# Patient Record
Sex: Female | Born: 1955 | Race: White | Hispanic: No | Marital: Married | Smoking: Never smoker
Health system: Southern US, Community
[De-identification: ages and names within clinical notes are randomized; demographics above are authoritative.]

## PROBLEM LIST (undated history)

## (undated) DIAGNOSIS — G2581 Restless legs syndrome: Secondary | ICD-10-CM

## (undated) DIAGNOSIS — R51 Headache: Secondary | ICD-10-CM

## (undated) DIAGNOSIS — F329 Major depressive disorder, single episode, unspecified: Secondary | ICD-10-CM

## (undated) DIAGNOSIS — F32A Depression, unspecified: Secondary | ICD-10-CM

## (undated) DIAGNOSIS — I639 Cerebral infarction, unspecified: Secondary | ICD-10-CM

## (undated) DIAGNOSIS — K219 Gastro-esophageal reflux disease without esophagitis: Secondary | ICD-10-CM

## (undated) DIAGNOSIS — G8929 Other chronic pain: Secondary | ICD-10-CM

## (undated) DIAGNOSIS — M1990 Unspecified osteoarthritis, unspecified site: Secondary | ICD-10-CM

## (undated) DIAGNOSIS — E785 Hyperlipidemia, unspecified: Secondary | ICD-10-CM

## (undated) DIAGNOSIS — I10 Essential (primary) hypertension: Secondary | ICD-10-CM

## (undated) DIAGNOSIS — M549 Dorsalgia, unspecified: Secondary | ICD-10-CM

## (undated) DIAGNOSIS — G5603 Carpal tunnel syndrome, bilateral upper limbs: Secondary | ICD-10-CM

## (undated) HISTORY — PX: CARPAL TUNNEL RELEASE: SHX101

## (undated) HISTORY — PX: ABDOMINAL HYSTERECTOMY: SHX81

## (undated) HISTORY — PX: LUMBAR EPIDURAL INJECTION: SHX1980

## (undated) HISTORY — PX: OTHER SURGICAL HISTORY: SHX169

## (undated) HISTORY — DX: Cerebral infarction, unspecified: I63.9

## (undated) HISTORY — PX: KNEE SURGERY: SHX244

---

## 1999-02-05 ENCOUNTER — Encounter: Payer: Self-pay | Admitting: Specialist

## 1999-02-05 ENCOUNTER — Ambulatory Visit (HOSPITAL_COMMUNITY): Admission: RE | Admit: 1999-02-05 | Discharge: 1999-02-05 | Payer: Self-pay | Admitting: Specialist

## 1999-02-19 ENCOUNTER — Encounter: Payer: Self-pay | Admitting: Specialist

## 1999-02-19 ENCOUNTER — Ambulatory Visit (HOSPITAL_COMMUNITY): Admission: RE | Admit: 1999-02-19 | Discharge: 1999-02-19 | Payer: Self-pay | Admitting: Specialist

## 1999-03-12 ENCOUNTER — Encounter: Payer: Self-pay | Admitting: Specialist

## 1999-03-12 ENCOUNTER — Ambulatory Visit (HOSPITAL_COMMUNITY): Admission: RE | Admit: 1999-03-12 | Discharge: 1999-03-12 | Payer: Self-pay | Admitting: Specialist

## 2000-04-05 ENCOUNTER — Ambulatory Visit (HOSPITAL_COMMUNITY): Admission: RE | Admit: 2000-04-05 | Discharge: 2000-04-05 | Payer: Self-pay | Admitting: Specialist

## 2001-07-03 ENCOUNTER — Encounter: Payer: Self-pay | Admitting: Family Medicine

## 2001-07-03 ENCOUNTER — Encounter: Admission: RE | Admit: 2001-07-03 | Discharge: 2001-07-03 | Payer: Self-pay | Admitting: Family Medicine

## 2005-01-19 ENCOUNTER — Observation Stay (HOSPITAL_COMMUNITY): Admission: EM | Admit: 2005-01-19 | Discharge: 2005-01-21 | Payer: Self-pay | Admitting: Emergency Medicine

## 2006-03-11 ENCOUNTER — Emergency Department (HOSPITAL_COMMUNITY): Admission: EM | Admit: 2006-03-11 | Discharge: 2006-03-11 | Payer: Self-pay | Admitting: Emergency Medicine

## 2006-11-09 ENCOUNTER — Ambulatory Visit (HOSPITAL_COMMUNITY): Admission: RE | Admit: 2006-11-09 | Discharge: 2006-11-09 | Payer: Self-pay | Admitting: Internal Medicine

## 2006-12-06 ENCOUNTER — Ambulatory Visit (HOSPITAL_COMMUNITY): Admission: RE | Admit: 2006-12-06 | Discharge: 2006-12-06 | Payer: Self-pay | Admitting: Internal Medicine

## 2007-06-19 ENCOUNTER — Other Ambulatory Visit: Admission: RE | Admit: 2007-06-19 | Discharge: 2007-06-19 | Payer: Self-pay | Admitting: Plastic Surgery

## 2007-12-10 ENCOUNTER — Ambulatory Visit (HOSPITAL_COMMUNITY): Admission: RE | Admit: 2007-12-10 | Discharge: 2007-12-10 | Payer: Self-pay | Admitting: Internal Medicine

## 2009-10-21 ENCOUNTER — Emergency Department (HOSPITAL_COMMUNITY): Admission: EM | Admit: 2009-10-21 | Discharge: 2009-10-21 | Payer: Self-pay | Admitting: Emergency Medicine

## 2009-11-11 ENCOUNTER — Emergency Department (HOSPITAL_COMMUNITY): Admission: EM | Admit: 2009-11-11 | Discharge: 2009-11-11 | Payer: Self-pay | Admitting: Emergency Medicine

## 2010-04-07 LAB — COMPREHENSIVE METABOLIC PANEL
ALT: 16 U/L (ref 0–35)
AST: 15 U/L (ref 0–37)
Albumin: 4.1 g/dL (ref 3.5–5.2)
BUN: 7 mg/dL (ref 6–23)
CO2: 24 mEq/L (ref 19–32)
Calcium: 9.1 mg/dL (ref 8.4–10.5)
Chloride: 98 mEq/L (ref 96–112)
GFR calc Af Amer: >60 mL/min (ref >60)
Glucose, Bld: 201 mg/dL — ABNORMAL HIGH (ref 70–99)
Sodium: 135 mEq/L (ref 135–145)
Total Bilirubin: 0.4 mg/dL (ref 0.3–1.2)

## 2010-04-07 LAB — URINALYSIS, ROUTINE W REFLEX MICROSCOPIC
Glucose, UA: 100 mg/dL — AB
Leukocytes, UA: NEGATIVE
Specific Gravity, Urine: 1.025 (ref 1.005–1.030)
pH: 6 (ref 5.0–8.0)

## 2010-06-11 NOTE — Op Note (Signed)
[facility]  Patient:    LAVREN, CHIAPPONE                       MRN: [id number] Proc. Date: 04/05/00 Adm. Date:  [id number] Attending:  Tye Savoy                           Operative Report  PREOPERATIVE DIAGNOSIS:  Chondromalacia, patellofemoral joint.  POSTOPERATIVE DIAGNOSES:  Chondromalacia, patellofemoral joint, grade 3 chondromalacia of the medial femoral condyle and medial tibial plateau, and a loose body, grade 3 changes of patellofemoral sulcus, extensive, chondromalacia of the patella.  BRIEF HISTORY AND INDICATIONS:  A 55 year old with persistent knee pain, MRI indicating tricompartmental osteoarthrosis.  Operative intervention is indicated for diagnosis and treatment of persistent pain in the knee and giving way, refractory to conservative treatment, including corticosteroid injections, anti-inflammatories, activity modification.  Risks and benefits discussed, including bleeding, infection, damage to vascular structures, DVT, PE, no change of symptoms, need for repeat debridement, corticosteroids, anti-inflammatories, etc.  DESCRIPTION OF PROCEDURE:  Patient in supine position.  After induction of an adequate general anesthesia, 1 g Kefzol, the right lower extremity was prepped and draped in the usual sterile fashion.  A lateral parapatellar portal and a superior medial parapatellar portal were fashioned with a #11 blade, egress cannula atraumatically placed.  Irrigant was utilized to insufflate the joint. Inspection of the suprapatellar pouch revealed extensive chondral pathology of the patellofemoral region particularly of the sulcus and the patella and medial and lateral facets.  Normal patellofemoral tracking.  Subtrochanteric synovium in the intercondylar notch.  _____ compartment exam revealed grade 3 changes of the femoral condyle and tibial plateau, and a loose chondral body was noted.  Under direct visualization, an  18-gauge needle was utilized to localize the medial parapatellar portal, which was fashioned with a #11 blade to _____ the medial meniscus.  A shaver was introduced and utilized to shave and evacuate the loose body.  Chondroplasty of the femoral condyle, tibial plateau, patella, and femoral sulcus was performed.  A basket rongeur was also utilized to remove chondral flap tears from the femoral sulcus.  ACL and PCL were unremarkable.  Lateral compartment had some minor grade 2 changes to the tibial plateau and femoral condyle.  Both menisci were stable to probe palpation without evidence of a tear.  Gutters were unremarkable as well.  The knee was copiously lavaged.  All compartments were re-examined.  There was no loose cartilaginous debris.  Chondroplasty of both patella and sulcus, femoral condyles was satisfactory.  The knee was then copiously irrigated, and all instrumentation was removed.  The portals were closed with 4-0 nylon simple sutures.  Marcaine 0.25% with epinephrine was infiltrated in the joint.  The wound was then dressed sterilely, secured with a six-inch Ace and a four-inch Ace down to the foot.  No tourniquet was utilized.  Operative time was 35 minutes.  The patient was awakened without difficulty and transported to the recovery room in stable condition.  The patient tolerated the procedure well with no complications. DD:  04/05/00 TD:  04/05/00 Job: [id number]

## 2010-06-11 NOTE — Discharge Summary (Signed)
Tracy Farrell, Tracy Farrell                ACCOUNT NO.:  [id number]   MEDICAL RECORD NO.:  [id number]          PATIENT TYPE:  OBV   LOCATION:  A208                          FACILITY:  [facility]   PHYSICIAN:  Audria Nine, M.D.DATE OF BIRTH:  1955/09/20   DATE OF ADMISSION:  01/19/2005  DATE OF DISCHARGE:  12/29/2006LH                                 DISCHARGE SUMMARY   PRIMARY CARE PHYSICIAN:  Carolann Littler, M.D. in [place].   ADMISSION DIAGNOSES:  Cellulitis of the right hand and paronychia.   DISCHARGE MEDICATIONS:  1.  Augmentin 875 mg p.o. b.i.d.  2.  Glyburide one p.o. b.i.d.  3.  Altace 5 mg p.o. daily.   OTHER DIAGNOSES OF NOTE:  1.  Insulin-dependent diabetes.  2.  Hypertension.  3.  Hyperlipidemia.  4.  Arthritis.   DIET:  1800, ADA, consistent carbohydrate, diabetic diet.   ACTIVITY:  As tolerated.  The patient advised to elevate the right hand.   SPECIAL INSTRUCTIONS:  The patient was advised to return to the emergency  room if hand swelling returns, severe pain in the hand or discoloration or  discharge where the abscess is.  The patient will also do daily dressings at  home. She will keep the hand elevated.   PERTINENT LABORATORY DATA:  On admission, her white blood cell count was  11.5, hemoglobin 13.9, hematocrit 41.2, platelet count 381.  Sodium 135,  potassium 3.7, chloride 100, BUN 5, creatinine 0.9, glucose 124.  Cultures  show no WBC with some rare gram-positive cocci in pairs and in chains.  Final identification was pending at the time of discharge.   HOSPITAL COURSE:  Kindly review Dr. Reggy Eye history and physical on  January 20, 2005 seen in the E-chart.   She is a 55 year old Caucasian female with diabetes mellitus who developed a  paronychia in the right third finger of several days duration.  She also had  a blister on the dorsum of her hand.  Examination at the time revealed that  she has cellulitis and a draining abscess.  She was also  subsequently  started on intravenous Unasyn and continued on insulin with a sliding scale.  Oral hypoglycemic agents were also resumed.  The patient showed a  significant improvement with markedly less swelling and the abscess stopped  draining pus.  The patient did not have any evidence of neurovascular  compromise and capillary filling of all her fingers on the right hand was  intact.  She also  had no discoloration.  The patient is being discharged today on oral  antibiotics, to follow up with her primary care physician in the next one to  two weeks.   DISPOSITION:  To home.      Audria Nine, M.D.  Electronically Signed     AM/MEDQ  D:  01/21/2005  T:  01/21/2005  Job:  [id number]

## 2010-06-11 NOTE — H&P (Signed)
NAMEBOSTYNN, HITCHINGS                ACCOUNT NO.:  [id number]   MEDICAL RECORD NO.:  [id number]          PATIENT TYPE:  OBV   LOCATION:  A208                          FACILITY:  [facility]   PHYSICIAN:  Debbe Odea, M.D.     DATE OF BIRTH:  September 26, 1955   DATE OF ADMISSION:  01/19/2005  DATE OF DISCHARGE:  LH                                HISTORY & PHYSICAL   PRIMARY CARE PHYSICIAN:  Dr. Elease Hashimoto in [place].   PRESENTING COMPLAINT:  Swelling and redness in her right hand.   HISTORY OF PRESENT ILLNESS:  This is a 55 year old Caucasian female with a  past medical history of insulin-dependent diabetes mellitus, who has  developed a paronychia in her right third few finger a few days ago, which  was followed by a blood blister on her dorsum of her hand, which was  recently punctured, draining blood and pus.  Her right hand was swollen and  warm.  She was unable to flex her fingers.  In the ER her paronychia has  been drained and the fluid has been sent for culture.  The patient believes  that she might have had a spider bite on the dorsum of her hand; however,  the swelling and blister formation on the hand started after the infection  in her right third finger and therefore it is most likely just the spread of  infection rather than an insect bite.  All other review of systems is  negative.   PAST MEDICAL HISTORY:  1.  Noninsulin-dependent diabetes mellitus.  2.  Hypertension.  3.  Hyperlipidemia.  4.  Arthritis.   PAST SURGICAL HISTORY:  1.  Carpal tunnel surgery in bilateral hands.  2.  Right knee arthroscopy.  3.  Hysterectomy.   ALLERGIES:  SULFA DRUGS caused formation of rash in her mouth.   SOCIAL HISTORY:  She does not smoke, does not use drugs, does not drink any  alcohol.  She is married.  She has two children, who are alive and healthy.   FAMILY HISTORY:  Both her mother and father had diabetes and died of heart  disease.  She has one brother, whom she states has  heart disease as well.   MEDICATIONS:  1.  She takes glyburide one tablet twice a day, she does not know the dose.  2.  She takes Altace 5 mg daily.   PHYSICAL EXAMINATION:  VITAL SIGNS:  Temperature was 98.6 degrees, blood  pressure 148/87, pulse 93, respiratory rate 18, pulse oximetry 96% on room  air.  HEENT:  Atraumatic, normocephalic.  Pupils equal, round, and reactive to  light and accommodation, extraocular muscles are intact.  Oral mucosa is  moist.  NECK:  Supple.  CARDIAC:  Regular rate and rhythm.  ABDOMEN:  Soft, nondistended, nondistended, bowel sounds are positive .  LUNGS:  Clear bilaterally.  EXTREMITIES:  Her right hand is swollen.  There is erythema, warmth and  tenderness.  There is a ruptured nodule which seems to have contained mostly  blood.  Her paronychia has been drained, and there is no longer any  swelling  in that area.  She is unable to completely flex her fingers due to swelling  and pain.   BLOOD WORK:  WBC count is 11.5, hemoglobin 13.9, hematocrit 41.2, platelets  381.  Sodium 135, potassium 3.7, chloride 100, bicarb 27, glucose 124, BUN  5, creatinine 0.9, calcium 9.2.   ASSESSMENT AND PLAN:  This is a 55 year old diabetic female with a right  hand paronychia, cellulitis and possible abscess which has ruptured.  She is  going to be hospitalized for IV antibiotics for at least 24 hours.  When  improvement is seen in the right hand and after culture report is obtained,  she will be discharged home on appropriate antibiotics.  Her home  medications will be continued while she is here.  She will be placed on  insulin sliding scale as well.      Debbe Odea, M.D.  Electronically Signed     SR/MEDQ  D:  01/20/2005  T:  01/20/2005  Job:  [id number]   cc:   Carolann Littler, M.D.  Fax: [phone]

## 2010-06-11 NOTE — H&P (Signed)
NAMEBONNA, DUPUIS                ACCOUNT NO.:  [id number]   MEDICAL RECORD NO.:  [id number]          PATIENT TYPE:  OBV   LOCATION:  A208                          FACILITY:  [facility]   PHYSICIAN:  Debbe Odea, M.D.     DATE OF BIRTH:  1955/11/23   DATE OF ADMISSION:  01/19/2005  DATE OF DISCHARGE:  LH                                HISTORY & PHYSICAL   Audio too short to transcribe (less than 5 seconds)      Debbe Odea, M.D.     SR/MEDQ  D:  01/20/2005  T:  01/20/2005  Job:  [id number]

## 2011-08-11 ENCOUNTER — Encounter (HOSPITAL_BASED_OUTPATIENT_CLINIC_OR_DEPARTMENT_OTHER): Payer: Self-pay | Admitting: *Deleted

## 2011-08-11 NOTE — Progress Notes (Signed)
To come in for bmet-ekg Hx cellutitis rt hand-2006 bilat CTS

## 2011-08-15 ENCOUNTER — Encounter (HOSPITAL_BASED_OUTPATIENT_CLINIC_OR_DEPARTMENT_OTHER)
Admission: RE | Admit: 2011-08-15 | Discharge: 2011-08-15 | Disposition: A | Discharge status: Home / Self Care | Payer: Medicaid Other | Source: Ambulatory Visit | Attending: Orthopedic Surgery | Admitting: Orthopedic Surgery

## 2011-08-15 LAB — BASIC METABOLIC PANEL
BUN: 15 mg/dL (ref 6–23)
Calcium: 9.5 mg/dL (ref 8.4–10.5)
Creatinine, Ser: 0.75 mg/dL (ref 0.50–1.10)
GFR calc Af Amer: >90 mL/min (ref >90)
GFR calc non Af Amer: >90 mL/min (ref >90)
Glucose, Bld: 140 mg/dL — ABNORMAL HIGH (ref 70–99)

## 2011-08-17 ENCOUNTER — Encounter (HOSPITAL_BASED_OUTPATIENT_CLINIC_OR_DEPARTMENT_OTHER): Payer: Self-pay

## 2011-08-17 ENCOUNTER — Encounter (HOSPITAL_BASED_OUTPATIENT_CLINIC_OR_DEPARTMENT_OTHER)
Admission: RE | Disposition: A | Discharge status: Home / Self Care | Payer: Self-pay | Source: Ambulatory Visit | Attending: Orthopedic Surgery

## 2011-08-17 ENCOUNTER — Encounter (HOSPITAL_BASED_OUTPATIENT_CLINIC_OR_DEPARTMENT_OTHER): Payer: Self-pay | Admitting: Certified Registered Nurse Anesthetist

## 2011-08-17 ENCOUNTER — Ambulatory Visit (HOSPITAL_BASED_OUTPATIENT_CLINIC_OR_DEPARTMENT_OTHER)
Admission: RE | Admit: 2011-08-17 | Discharge: 2011-08-17 | Disposition: A | Discharge status: Home / Self Care | Payer: Medicaid Other | Source: Ambulatory Visit | Attending: Orthopedic Surgery | Admitting: Orthopedic Surgery

## 2011-08-17 ENCOUNTER — Ambulatory Visit (HOSPITAL_BASED_OUTPATIENT_CLINIC_OR_DEPARTMENT_OTHER): Payer: Medicaid Other | Admitting: Certified Registered Nurse Anesthetist

## 2011-08-17 DIAGNOSIS — M129 Arthropathy, unspecified: Secondary | ICD-10-CM | POA: Insufficient documentation

## 2011-08-17 DIAGNOSIS — I10 Essential (primary) hypertension: Secondary | ICD-10-CM | POA: Insufficient documentation

## 2011-08-17 DIAGNOSIS — G2581 Restless legs syndrome: Secondary | ICD-10-CM | POA: Insufficient documentation

## 2011-08-17 DIAGNOSIS — F3289 Other specified depressive episodes: Secondary | ICD-10-CM | POA: Insufficient documentation

## 2011-08-17 DIAGNOSIS — M549 Dorsalgia, unspecified: Secondary | ICD-10-CM | POA: Insufficient documentation

## 2011-08-17 DIAGNOSIS — R51 Headache: Secondary | ICD-10-CM | POA: Insufficient documentation

## 2011-08-17 DIAGNOSIS — E785 Hyperlipidemia, unspecified: Secondary | ICD-10-CM | POA: Insufficient documentation

## 2011-08-17 DIAGNOSIS — E119 Type 2 diabetes mellitus without complications: Secondary | ICD-10-CM | POA: Insufficient documentation

## 2011-08-17 DIAGNOSIS — G5600 Carpal tunnel syndrome, unspecified upper limb: Secondary | ICD-10-CM | POA: Insufficient documentation

## 2011-08-17 DIAGNOSIS — K219 Gastro-esophageal reflux disease without esophagitis: Secondary | ICD-10-CM | POA: Insufficient documentation

## 2011-08-17 DIAGNOSIS — F329 Major depressive disorder, single episode, unspecified: Secondary | ICD-10-CM | POA: Insufficient documentation

## 2011-08-17 HISTORY — DX: Unspecified osteoarthritis, unspecified site: M19.90

## 2011-08-17 HISTORY — DX: Depression, unspecified: F32.A

## 2011-08-17 HISTORY — DX: Gastro-esophageal reflux disease without esophagitis: K21.9

## 2011-08-17 HISTORY — DX: Hyperlipidemia, unspecified: E78.5

## 2011-08-17 HISTORY — DX: Dorsalgia, unspecified: M54.9

## 2011-08-17 HISTORY — DX: Restless legs syndrome: G25.81

## 2011-08-17 HISTORY — DX: Headache: R51

## 2011-08-17 HISTORY — DX: Essential (primary) hypertension: I10

## 2011-08-17 HISTORY — DX: Carpal tunnel syndrome, bilateral upper limbs: G56.03

## 2011-08-17 HISTORY — PX: CARPAL TUNNEL RELEASE: SHX101

## 2011-08-17 HISTORY — DX: Other chronic pain: G89.29

## 2011-08-17 HISTORY — DX: Major depressive disorder, single episode, unspecified: F32.9

## 2011-08-17 LAB — GLUCOSE, CAPILLARY: Glucose-Capillary: 113 mg/dL — ABNORMAL HIGH (ref 70–99)

## 2011-08-17 LAB — POCT HEMOGLOBIN-HEMACUE: Hemoglobin: 12.9 g/dL (ref 12.0–15.0)

## 2011-08-17 SURGERY — CARPAL TUNNEL RELEASE
Anesthesia: Regional | Site: Wrist | Laterality: Right | Wound class: Clean

## 2011-08-17 MED ORDER — FENTANYL CITRATE 0.05 MG/ML IJ SOLN
INTRAMUSCULAR | Status: DC | PRN
  Administered 2011-08-17: 50 ug via INTRAVENOUS
  Administered 2011-08-17: 25 ug via INTRAVENOUS

## 2011-08-17 MED ORDER — MEPERIDINE HCL 25 MG/ML IJ SOLN: 6.2500 mg | INTRAMUSCULAR | Status: DC | PRN

## 2011-08-17 MED ORDER — LIDOCAINE HCL (PF) 0.5 % IJ SOLN
INTRAMUSCULAR | Status: DC | PRN
  Administered 2011-08-17: 50 mL via INTRATHECAL

## 2011-08-17 MED ORDER — CHLORHEXIDINE GLUCONATE 4 % EX LIQD: 60.0000 mL | Freq: Once | CUTANEOUS | Status: DC

## 2011-08-17 MED ORDER — CEFAZOLIN SODIUM-DEXTROSE 2-3 GM-% IV SOLR
2.0000 g | INTRAVENOUS | Status: AC
  Administered 2011-08-17: 2 g via INTRAVENOUS

## 2011-08-17 MED ORDER — LACTATED RINGERS IV SOLN
INTRAVENOUS | Status: DC
  Administered 2011-08-17: 10:00:00 via INTRAVENOUS

## 2011-08-17 MED ORDER — MIDAZOLAM HCL 2 MG/2ML IJ SOLN: 0.5000 mg | Freq: Once | INTRAMUSCULAR | Status: DC | PRN

## 2011-08-17 MED ORDER — LIDOCAINE HCL (CARDIAC) 20 MG/ML IV SOLN
INTRAVENOUS | Status: DC | PRN
  Administered 2011-08-17: 30 mg via INTRAVENOUS

## 2011-08-17 MED ORDER — HYDROCODONE-ACETAMINOPHEN 5-500 MG PO TABS: 1.0000 | ORAL_TABLET | ORAL | Status: AC | PRN

## 2011-08-17 MED ORDER — FENTANYL CITRATE 0.05 MG/ML IJ SOLN
25.0000 ug | INTRAMUSCULAR | Status: DC | PRN
  Administered 2011-08-17: 25 ug via INTRAVENOUS

## 2011-08-17 MED ORDER — MIDAZOLAM HCL 5 MG/5ML IJ SOLN
INTRAMUSCULAR | Status: DC | PRN
  Administered 2011-08-17: 1 mg via INTRAVENOUS

## 2011-08-17 MED ORDER — PROMETHAZINE HCL 25 MG/ML IJ SOLN: 6.2500 mg | INTRAMUSCULAR | Status: DC | PRN

## 2011-08-17 MED ORDER — BUPIVACAINE HCL (PF) 0.25 % IJ SOLN
INTRAMUSCULAR | Status: DC | PRN
  Administered 2011-08-17: 4 mL

## 2011-08-17 MED ORDER — PROPOFOL 10 MG/ML IV EMUL
INTRAVENOUS | Status: DC | PRN
  Administered 2011-08-17: 25 ug/kg/min via INTRAVENOUS

## 2011-08-17 MED ORDER — ONDANSETRON HCL 4 MG/2ML IJ SOLN
INTRAMUSCULAR | Status: DC | PRN
  Administered 2011-08-17: 4 mg via INTRAVENOUS

## 2011-08-17 SURGICAL SUPPLY — 39 items
BANDAGE GAUZE ELAST BULKY 4 IN (GAUZE/BANDAGES/DRESSINGS) ×2 IMPLANT
BLADE SURG 15 STRL LF DISP TIS (BLADE) ×1 IMPLANT
BLADE SURG 15 STRL SS (BLADE) ×2
BNDG CMPR 9X4 STRL LF SNTH (GAUZE/BANDAGES/DRESSINGS)
BNDG COHESIVE 3X5 TAN STRL LF (GAUZE/BANDAGES/DRESSINGS) ×2 IMPLANT
BNDG ESMARK 4X9 LF (GAUZE/BANDAGES/DRESSINGS) IMPLANT
CHLORAPREP W/TINT 26ML (MISCELLANEOUS) ×2 IMPLANT
CLOTH BEACON ORANGE TIMEOUT ST (SAFETY) ×2 IMPLANT
CORDS BIPOLAR (ELECTRODE) ×2 IMPLANT
COVER MAYO STAND STRL (DRAPES) ×2 IMPLANT
COVER TABLE BACK 60X90 (DRAPES) ×2 IMPLANT
CUFF TOURNIQUET SINGLE 18IN (TOURNIQUET CUFF) ×1 IMPLANT
DRAPE EXTREMITY T 121X128X90 (DRAPE) ×2 IMPLANT
DRAPE SURG 17X23 STRL (DRAPES) ×2 IMPLANT
DRSG KUZMA FLUFF (GAUZE/BANDAGES/DRESSINGS) ×2 IMPLANT
GAUZE XEROFORM 1X8 LF (GAUZE/BANDAGES/DRESSINGS) ×2 IMPLANT
GLOVE BIO SURGEON STRL SZ 6.5 (GLOVE) ×2 IMPLANT
GLOVE EXAM NITRILE PF MED BLUE (GLOVE) ×1 IMPLANT
GLOVE SURG ORTHO 8.0 STRL STRW (GLOVE) ×2 IMPLANT
GOWN BRE IMP PREV XXLGXLNG (GOWN DISPOSABLE) ×2 IMPLANT
GOWN PREVENTION PLUS XLARGE (GOWN DISPOSABLE) ×2 IMPLANT
NEEDLE 27GAX1X1/2 (NEEDLE) ×1 IMPLANT
NS IRRIG 1000ML POUR BTL (IV SOLUTION) ×2 IMPLANT
PACK BASIN DAY SURGERY FS (CUSTOM PROCEDURE TRAY) ×2 IMPLANT
PAD CAST 3X4 CTTN HI CHSV (CAST SUPPLIES) ×1 IMPLANT
PADDING CAST ABS 4INX4YD NS (CAST SUPPLIES)
PADDING CAST ABS COTTON 4X4 ST (CAST SUPPLIES) ×1 IMPLANT
PADDING CAST COTTON 3X4 STRL (CAST SUPPLIES) ×2
SPLINT PLASTER CAST XFAST 3X15 (CAST SUPPLIES) IMPLANT
SPLINT PLASTER XTRA FASTSET 3X (CAST SUPPLIES) ×10
SPONGE GAUZE 4X4 12PLY (GAUZE/BANDAGES/DRESSINGS) ×2 IMPLANT
STOCKINETTE 4X48 STRL (DRAPES) ×2 IMPLANT
SUT VIC AB 4-0 P2 18 (SUTURE) ×1 IMPLANT
SUT VICRYL 4-0 PS2 18IN ABS (SUTURE) IMPLANT
SUT VICRYL RAPIDE 4/0 PS 2 (SUTURE) ×2 IMPLANT
SYR BULB 3OZ (MISCELLANEOUS) ×2 IMPLANT
SYR CONTROL 10ML LL (SYRINGE) ×1 IMPLANT
TOWEL OR 17X24 6PK STRL BLUE (TOWEL DISPOSABLE) ×2 IMPLANT
UNDERPAD 30X30 INCONTINENT (UNDERPADS AND DIAPERS) ×2 IMPLANT

## 2011-08-17 NOTE — Anesthesia Preprocedure Evaluation (Signed)
Anesthesia Evaluation  Patient identified by MRN, date of birth, ID band Patient awake    Reviewed: Allergy & Precautions, H&P , NPO status , Patient's Chart, lab work & pertinent test results, reviewed documented beta blocker date and time   History of Anesthesia Complications Negative for: history of anesthetic complications  Airway Mallampati: I TM Distance: >3 FB Neck ROM: Full    Dental No notable dental hx. (+) Dental Advisory Given   Pulmonary neg pulmonary ROS,  breath sounds clear to auscultation  Pulmonary exam normal       Cardiovascular hypertension, Pt. on medications Rhythm:Regular Rate:Normal     Neuro/Psych negative neurological ROS     GI/Hepatic Neg liver ROS, GERD-  Medicated and Controlled,  Endo/Other  Well Controlled, Type 2, Oral Hypoglycemic AgentsMorbid obesity  Renal/GU      Musculoskeletal   Abdominal (+) + obese,   Peds  Hematology negative hematology ROS (+)   Anesthesia Other Findings   Reproductive/Obstetrics                           Anesthesia Physical Anesthesia Plan  ASA: II  Anesthesia Plan: Bier Block   Post-op Pain Management:    Induction:   Airway Management Planned: Simple Face Mask  Additional Equipment:   Intra-op Plan:   Post-operative Plan:   Informed Consent: I have reviewed the patients History and Physical, chart, labs and discussed the procedure including the risks, benefits and alternatives for the proposed anesthesia with the patient or authorized representative who has indicated his/her understanding and acceptance.   Dental advisory given  Plan Discussed with: Surgeon and CRNA  Anesthesia Plan Comments: (Plan routine monitors, IV Regional Lidocaine)        Anesthesia Quick Evaluation

## 2011-08-17 NOTE — Transfer of Care (Signed)
Immediate Anesthesia Transfer of Care Note  Patient: Tracy Farrell  Procedure(s) Performed: Procedure(s) (LRB): CARPAL TUNNEL RELEASE (Right)  Patient Location: PACU  Anesthesia Type: MAC and Bier block  Level of Consciousness: awake, alert , oriented and patient cooperative  Airway & Oxygen Therapy: Patient Spontanous Breathing and Patient connected to face mask oxygen  Post-op Assessment: Report given to PACU RN and Post -op Vital signs reviewed and stable  Post vital signs: Reviewed and stable  Complications: No apparent anesthesia complications

## 2011-08-17 NOTE — Op Note (Signed)
Dictated [phone]

## 2011-08-17 NOTE — Anesthesia Procedure Notes (Signed)
Date/Time: 08/17/2011 11:00 AM Performed by: Makenzey Nanni D Pre-anesthesia Checklist: Patient identified, Emergency Drugs available, Suction available, Patient being monitored and Timeout performed Patient Re-evaluated:Patient Re-evaluated prior to inductionOxygen Delivery Method: Simple face mask

## 2011-08-17 NOTE — Op Note (Signed)
NAMEGWENDOLY, Tracy Farrell                ACCOUNT NO.:  [id number]  MEDICAL RECORD NO.:  [id number]  LOCATION:                                 FACILITY:  PHYSICIAN:  Daryll Brod, M.D.            DATE OF BIRTH:  DATE OF PROCEDURE:  08/17/2011 DATE OF DISCHARGE:                              OPERATIVE REPORT   PREOPERATIVE DIAGNOSIS:  Recurrent carpal tunnel syndrome, right hand.  POSTOPERATIVE DIAGNOSIS:  Recurrent carpal tunnel syndrome, right hand.  OPERATION:  Re-release of carpal canal with hypothenar fat pad transfer, right hand.  SURGEON:  Daryll Brod, MD  ANESTHESIA:  Upper arm IV regional.  ANESTHESIOLOGIST:  Dr. Glennon Mac.  HISTORY:  The patient is a 56 year old female with a history of recurrent carpal tunnel syndrome.  She underwent carpal tunnel release approximately 20 years ago, has had recurrence of symptoms with markedly positive nerve conductions.  She does have arthritic changes of cervical spine as well aware of the potential for a double crush.  Pre, peri, and postoperative course have been discussed along with risks and complications.  She is aware that there is no guarantee with the surgery, possibility of infection, recurrence, injury to arteries, nerves, tendons, incomplete relief of symptoms, dystrophy.  In the preoperative area, the patient is seen, the extremity marked by both the patient and surgeon.  Antibiotic given.  DESCRIPTION OF PROCEDURE:  The patient was brought to the operating room where an upper arm IV regional anesthetic was carried out without difficulty.  She was prepped using ChloraPrep, supine position, right arm free.  A 3-minute dry time was allowed.  Time-out taken, confirming the patient and procedure.  The incision was made longitudinally in the palm, carried to the ulnar side of her wrist then on to her distal forearm to the level of her proximal aspect of her incision, carried down through subcutaneous tissue.  Dissection was carried to  the radial side of the carpal retinaculum allowing the fat to be elevated off from the retinaculum.  The distal forearm fascia and flexor carpal retinaculum was identified at the distal margin, this was incised longitudinally with sharp dissection.  The median nerve was immediately apparent with an hourglass deformity at the level of the hamate hook. The forearm fascia was released.  The hypothenar fat pad was then harvested and allowing this to be rotated subcutaneously.  The tissue was dissected free from the overlying dermis.  This was maintained on an ulnar pedicle rotated into the carpal canal and sutured to the under surface of the flexor retinaculum after copiously irrigating the canal with saline.  The suturing was done with interrupted 4-0 Vicryl sutures. This allowed a new bed for the nerve to align in the subcutaneous tissue.  Remainder was then closed with interrupted 4-0 Vicryl and the skin with interrupted 4-0 Vicryl Rapide sutures.  A sterile compressive dressing was applied.  The tourniquet was deflated.  The fingers immediately pinked.  A splint was applied to the forearm.  The patient was taken to the recovery room for observation.          ______________________________ Daryll Brod, M.D.  GK/MEDQ  D:  08/17/2011  T:  08/17/2011  Job:  [id number]

## 2011-08-17 NOTE — Brief Op Note (Signed)
08/17/2011  11:47 AM  PATIENT:  Princess Perna  56 y.o. female  PRE-OPERATIVE DIAGNOSIS:  recurrent right carpal tunnel syndrome  POST-OPERATIVE DIAGNOSIS:  recurrent right carpal tunnel syndrome  PROCEDURE:  Procedure(s) (LRB): CARPAL TUNNEL RELEASE (Right)  SURGEON:  Surgeon(s) and Role:    * Wynonia Sours, MD - Primary  PHYSICIAN ASSISTANT:   ASSISTANTS: none   ANESTHESIA:   local and regional  EBL:  Total I/O In: 800 [I.V.:800] Out: -   BLOOD ADMINISTERED:none  DRAINS: none   LOCAL MEDICATIONS USED:  MARCAINE     SPECIMEN:  No Specimen  DISPOSITION OF SPECIMEN:  N/A  COUNTS:  YES  TOURNIQUET:   Total Tourniquet Time Documented: Upper Arm (Right) - 41 minutes  DICTATION: .Other Dictation: Dictation Number [id number]  PLAN OF CARE: Discharge to home after PACU  PATIENT DISPOSITION:  PACU - hemodynamically stable.

## 2011-08-17 NOTE — Anesthesia Postprocedure Evaluation (Signed)
  Anesthesia Post-op Note  Patient: Tracy Farrell  Procedure(s) Performed: Procedure(s) (LRB): CARPAL TUNNEL RELEASE (Right)  Patient Location: PACU  Anesthesia Type: Bier block  Level of Consciousness: awake, alert  and oriented  Airway and Oxygen Therapy: Patient Spontanous Breathing  Post-op Pain: none  Post-op Assessment: Post-op Vital signs reviewed, Patient's Cardiovascular Status Stable, Respiratory Function Stable, Patent Airway, No signs of Nausea or vomiting and Pain level controlled  Post-op Vital Signs: Reviewed and stable  Complications: No apparent anesthesia complications
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
## 2011-08-18 ENCOUNTER — Encounter (HOSPITAL_BASED_OUTPATIENT_CLINIC_OR_DEPARTMENT_OTHER): Payer: Self-pay | Admitting: Orthopedic Surgery

## 2011-08-25 ENCOUNTER — Ambulatory Visit: Payer: Medicaid Other | Admitting: Occupational Therapy

## 2017-05-29 LAB — HM DIABETES EYE EXAM

## 2018-10-07 ENCOUNTER — Emergency Department (HOSPITAL_COMMUNITY): Payer: Medicaid Other

## 2018-10-07 ENCOUNTER — Other Ambulatory Visit: Payer: Self-pay

## 2018-10-07 ENCOUNTER — Encounter (HOSPITAL_COMMUNITY): Payer: Self-pay | Admitting: Emergency Medicine

## 2018-10-07 ENCOUNTER — Inpatient Hospital Stay (HOSPITAL_COMMUNITY)
Admission: EM | Admit: 2018-10-07 | Discharge: 2018-10-09 | DRG: 100 | Disposition: A | Discharge status: Home / Self Care | Payer: Medicaid Other | Attending: Internal Medicine | Admitting: Internal Medicine

## 2018-10-07 DIAGNOSIS — Z8673 Personal history of transient ischemic attack (TIA), and cerebral infarction without residual deficits: Secondary | ICD-10-CM | POA: Diagnosis present

## 2018-10-07 DIAGNOSIS — G9341 Metabolic encephalopathy: Secondary | ICD-10-CM

## 2018-10-07 DIAGNOSIS — E785 Hyperlipidemia, unspecified: Secondary | ICD-10-CM | POA: Diagnosis present

## 2018-10-07 DIAGNOSIS — R4701 Aphasia: Secondary | ICD-10-CM | POA: Diagnosis present

## 2018-10-07 DIAGNOSIS — G8929 Other chronic pain: Secondary | ICD-10-CM | POA: Diagnosis present

## 2018-10-07 DIAGNOSIS — E1165 Type 2 diabetes mellitus with hyperglycemia: Secondary | ICD-10-CM | POA: Diagnosis present

## 2018-10-07 DIAGNOSIS — E440 Moderate protein-calorie malnutrition: Secondary | ICD-10-CM

## 2018-10-07 DIAGNOSIS — G2581 Restless legs syndrome: Secondary | ICD-10-CM | POA: Diagnosis present

## 2018-10-07 DIAGNOSIS — M549 Dorsalgia, unspecified: Secondary | ICD-10-CM | POA: Diagnosis present

## 2018-10-07 DIAGNOSIS — Z882 Allergy status to sulfonamides status: Secondary | ICD-10-CM

## 2018-10-07 DIAGNOSIS — E119 Type 2 diabetes mellitus without complications: Secondary | ICD-10-CM

## 2018-10-07 DIAGNOSIS — R4182 Altered mental status, unspecified: Secondary | ICD-10-CM | POA: Diagnosis present

## 2018-10-07 DIAGNOSIS — I10 Essential (primary) hypertension: Secondary | ICD-10-CM

## 2018-10-07 DIAGNOSIS — K219 Gastro-esophageal reflux disease without esophagitis: Secondary | ICD-10-CM | POA: Diagnosis present

## 2018-10-07 DIAGNOSIS — Z6837 Body mass index (BMI) 37.0-37.9, adult: Secondary | ICD-10-CM

## 2018-10-07 DIAGNOSIS — E1159 Type 2 diabetes mellitus with other circulatory complications: Secondary | ICD-10-CM | POA: Diagnosis present

## 2018-10-07 DIAGNOSIS — R29701 NIHSS score 1: Secondary | ICD-10-CM | POA: Diagnosis present

## 2018-10-07 DIAGNOSIS — Z7984 Long term (current) use of oral hypoglycemic drugs: Secondary | ICD-10-CM

## 2018-10-07 DIAGNOSIS — Z794 Long term (current) use of insulin: Secondary | ICD-10-CM

## 2018-10-07 DIAGNOSIS — R739 Hyperglycemia, unspecified: Secondary | ICD-10-CM

## 2018-10-07 DIAGNOSIS — D649 Anemia, unspecified: Secondary | ICD-10-CM

## 2018-10-07 DIAGNOSIS — I639 Cerebral infarction, unspecified: Secondary | ICD-10-CM

## 2018-10-07 DIAGNOSIS — Z20828 Contact with and (suspected) exposure to other viral communicable diseases: Secondary | ICD-10-CM | POA: Diagnosis present

## 2018-10-07 DIAGNOSIS — Z8249 Family history of ischemic heart disease and other diseases of the circulatory system: Secondary | ICD-10-CM

## 2018-10-07 DIAGNOSIS — G40209 Localization-related (focal) (partial) symptomatic epilepsy and epileptic syndromes with complex partial seizures, not intractable, without status epilepticus: Principal | ICD-10-CM | POA: Diagnosis present

## 2018-10-07 DIAGNOSIS — M1990 Unspecified osteoarthritis, unspecified site: Secondary | ICD-10-CM | POA: Diagnosis present

## 2018-10-07 LAB — CBC
HCT: 36.8 % (ref 36.0–46.0)
Hemoglobin: 11.6 g/dL — ABNORMAL LOW (ref 12.0–15.0)
MCH: 26 pg (ref 26.0–34.0)
MCHC: 31.5 g/dL (ref 30.0–36.0)
MCV: 82.5 fL (ref 80.0–100.0)
Platelets: 273 10*3/uL (ref 150–400)
RBC: 4.46 MIL/uL (ref 3.87–5.11)
RDW: 15.8 % — ABNORMAL HIGH (ref 11.5–15.5)
WBC: 7 10*3/uL (ref 4.0–10.5)
nRBC: 0 % (ref 0.0–0.2)

## 2018-10-07 LAB — CBG MONITORING, ED: Glucose-Capillary: 383 mg/dL — ABNORMAL HIGH (ref 70–99)

## 2018-10-07 LAB — BASIC METABOLIC PANEL
Anion gap: 9 (ref 5–15)
BUN: 16 mg/dL (ref 8–23)
CO2: 24 mmol/L (ref 22–32)
Calcium: 8.6 mg/dL — ABNORMAL LOW (ref 8.9–10.3)
Chloride: 95 mmol/L — ABNORMAL LOW (ref 98–111)
Creatinine, Ser: 1.03 mg/dL — ABNORMAL HIGH (ref 0.44–1.00)
GFR calc Af Amer: >60 mL/min (ref >60)
GFR calc non Af Amer: 57 mL/min — ABNORMAL LOW (ref >60)
Glucose, Bld: 398 mg/dL — ABNORMAL HIGH (ref 70–99)
Potassium: 4.3 mmol/L (ref 3.5–5.1)
Sodium: 128 mmol/L — ABNORMAL LOW (ref 135–145)

## 2018-10-07 LAB — HEPATIC FUNCTION PANEL
ALT: 14 U/L (ref 0–44)
AST: 12 U/L — ABNORMAL LOW (ref 15–41)
Albumin: 3.2 g/dL — ABNORMAL LOW (ref 3.5–5.0)
Alkaline Phosphatase: 80 U/L (ref 38–126)
Bilirubin, Direct: 0.1 mg/dL (ref 0.0–0.2)
Indirect Bilirubin: 0.5 mg/dL (ref 0.3–0.9)
Total Bilirubin: 0.6 mg/dL (ref 0.3–1.2)
Total Protein: 6.2 g/dL — ABNORMAL LOW (ref 6.5–8.1)

## 2018-10-07 LAB — SARS CORONAVIRUS 2 BY RT PCR (HOSPITAL ORDER, PERFORMED IN ~~LOC~~ HOSPITAL LAB): SARS Coronavirus 2: NEGATIVE

## 2018-10-07 LAB — ETHANOL: Alcohol, Ethyl (B): <10 mg/dL (ref <10)

## 2018-10-07 MED ORDER — SODIUM CHLORIDE 0.9 % IV SOLN
INTRAVENOUS | Status: DC
  Administered 2018-10-07 – 2018-10-09 (×3): via INTRAVENOUS

## 2018-10-07 MED ORDER — INSULIN ASPART 100 UNIT/ML ~~LOC~~ SOLN
10.0000 [IU] | Freq: Once | SUBCUTANEOUS | Status: AC
  Administered 2018-10-07: 10 [IU] via INTRAVENOUS
  Filled 2018-10-07: qty 1

## 2018-10-07 MED ORDER — INSULIN ASPART 100 UNIT/ML ~~LOC~~ SOLN
0.0000 [IU] | Freq: Every day | SUBCUTANEOUS | Status: DC
  Administered 2018-10-08: 3 [IU] via SUBCUTANEOUS
  Administered 2018-10-08: 2 [IU] via SUBCUTANEOUS

## 2018-10-07 MED ORDER — INSULIN ASPART 100 UNIT/ML ~~LOC~~ SOLN
0.0000 [IU] | Freq: Three times a day (TID) | SUBCUTANEOUS | Status: DC
  Administered 2018-10-08: 7 [IU] via SUBCUTANEOUS
  Administered 2018-10-08 (×2): 5 [IU] via SUBCUTANEOUS
  Administered 2018-10-09: 3 [IU] via SUBCUTANEOUS
  Administered 2018-10-09: 9 [IU] via SUBCUTANEOUS
  Administered 2018-10-09: 3 [IU] via SUBCUTANEOUS

## 2018-10-07 MED ORDER — SODIUM CHLORIDE 0.9 % IV BOLUS
500.0000 mL | Freq: Once | INTRAVENOUS | Status: AC
  Administered 2018-10-07: 500 mL via INTRAVENOUS

## 2018-10-07 NOTE — H&P (Signed)
TRH H&P    Patient Demographics:    Tracy Farrell, is a 63 y.o. female  MRN: [id number]  DOB - 1955-08-06  Admit Date - 10/07/2018  Referring MD/NP/PA: Aundria Rud  Outpatient Primary MD for the patient is Patient, No Pcp Per  Patient coming from:  home  Chief complaint-  Confusion,    HPI:    Tracy Farrell  is a 63 y.o. female, w Dm2, RLS, apparently presents with c/o confusion, intermittently over the past 1 month, particularly when her sugar is high.  Family member states has had confusion since this morning.  Pt is currently axoxo3 (person, place, year).  Pt has slight expressive aphasia.  Word finding difficulty.  Pt denies vision change, headache, numbness, tingling, weakness, cp, palp, sob,    In ED,  T 99.5, P 84  R 20 Bp 160/79  Pox 97% on RA  covid-19 negative  Wbc 7.0, Hgb 11.6, Plt 273 Na 128, K 4.3, Bun 16, Creatinine 1.03 Ast 12, Alt 14, Alk phos 80, T. Bili 0.6 Etoh <10  CT brain  IMPRESSION: 1.  No acute intracranial abnormality. 2. Chronic ventriculomegaly, similar to that described in 2003 and likely physiologic for this patient. 3. Mild for age nonspecific white matter changes in the left frontal lobe.  CXR IMPRESSION: Cardiomegaly.  Interstitial opacities which may represent pulmonary vascular redistribution and mild interstitial edema.   Pt will be admitted for AMS      Review of systems:    In addition to the HPI above,  No Fever-chills, No Headache, No changes with Vision or hearing, No problems swallowing food or Liquids, No Chest pain, Cough or Shortness of Breath, No Abdominal pain, No Nausea or Vomiting, bowel movements are regular, No Blood in stool or Urine, No dysuria, No new skin rashes or bruises, No new joints pains-aches,  No new weakness, tingling, numbness in any extremity, No recent weight gain or loss, No polyuria, polydypsia or  polyphagia, No significant Mental Stressors.  All other systems reviewed and are negative.    Past History of the following :    Past Medical History:  Diagnosis Date  . Arthritis   . Carpal tunnel syndrome, bilateral   . Chronic back pain   . Depression   . Diabetes mellitus   . GERD (gastroesophageal reflux disease)   . Headache(784.0)   . Hyperlipemia   . Hypertension   . RLS (restless legs syndrome)       Past Surgical History:  Procedure Laterality Date  . ABDOMINAL HYSTERECTOMY    . CARPAL TUNNEL RELEASE     rt  . CARPAL TUNNEL RELEASE  08/17/2011   Procedure: CARPAL TUNNEL RELEASE;  Surgeon: Wynonia Sours, MD;  Location: [facility];  Service: Orthopedics;  Laterality: Right;  re-release carpal canal hypothenar fat pad transfer  . LUMBAR EPIDURAL INJECTION        Social History:      Social History   Tobacco Use  . Smoking status: Never Smoker  . Smokeless tobacco: Never  Used  Substance Use Topics  . Alcohol use: No       Family History :    No family history on file.  + hypertension   Home Medications:   Prior to Admission medications   Medication Sig Start Date End Date Taking? Authorizing Provider  insulin degludec (TRESIBA FLEXTOUCH) 100 UNIT/ML SOPN FlexTouch Pen Inject 10 Units into the skin daily.   Yes [provider]  metFORMIN (GLUCOPHAGE) 1000 MG tablet Take 1,000 mg by mouth 2 (two) times daily with a meal.   Yes [provider]  omeprazole (PRILOSEC) 20 MG capsule Take 20 mg by mouth daily.   Yes [provider]  rOPINIRole (REQUIP) 1 MG tablet Take 1 mg by mouth at bedtime.   Yes [provider]     Allergies:     Allergies  Allergen Reactions  . Sulfa Antibiotics Other (See Comments)    UNKNOWN REACTION     Physical Exam:   Vitals  Blood pressure (!) 165/80, pulse 81, temperature 99.5 F (37.5 C), temperature source Oral, resp. rate 17, SpO2 97 %.  1.  General: axoxo3   2. Psychiatric: euthymic  3. Neurologic: cn2-12 intact, reflexes 2+ symmetric, diffuse with no clonus, motor 5/5 in all 4 ext  4. HEENMT:  Anicteric, pupils 1.74m symmetric, direct, consensual, near intact Neck: no jvd  5. Respiratory : CTAB  6. Cardiovascular : rrr s1, s2,   7. Gastrointestinal:  Abd: soft, nt, nd, +bs  8. Skin:  Ext: no c/c/e,  No rash  9.Musculoskeletal:  Good rom    Data Review:    CBC Recent Labs  Lab 10/07/18 2012  WBC 7.0  HGB 11.6*  HCT 36.8  PLT 273  MCV 82.5  MCH 26.0  MCHC 31.5  RDW 15.8*   ------------------------------------------------------------------------------------------------------------------  Results for orders placed or performed during the hospital encounter of 10/07/18 (from the past 48 hour(s))  CBG monitoring, ED     Status: Abnormal   Collection Time: 10/07/18  7:33 PM  Result Value Ref Range   Glucose-Capillary 383 (H) 70 - 99 mg/dL  SARS Coronavirus 2 ([facility] Nasopharyngeal Nasopharyngeal Swab     Status: None   Collection Time: 10/07/18  8:04 PM   Specimen: Nasopharyngeal Swab  Result Value Ref Range   SARS Coronavirus 2 NEGATIVE NEGATIVE    Comment: (NOTE) If result is NEGATIVE SARS-CoV-2 target nucleic acids are NOT DETECTED. The SARS-CoV-2 RNA is generally detectable in upper and lower  respiratory specimens during the acute phase of infection. The lowest  concentration of SARS-CoV-2 viral copies this assay can detect is 250  copies / mL. A negative result does not preclude SARS-CoV-2 infection  and should not be used as the sole basis for treatment or other  patient management decisions.  A negative result may occur with  improper specimen collection / handling, submission of specimen other  than nasopharyngeal swab, presence of viral mutation(s) within the  areas targeted by this assay, and inadequate number of viral copies  (<250 copies / mL). A  negative result must be combined with clinical  observations, patient history, and epidemiological information. If result is POSITIVE SARS-CoV-2 target nucleic acids are DETECTED. The SARS-CoV-2 RNA is generally detectable in upper and lower  respiratory specimens dur ing the acute phase of infection.  Positive  results are indicative of active infection with SARS-CoV-2.  Clinical  correlation with patient history and other diagnostic information is  necessary to determine patient infection status.  Positive results do  not rule out bacterial infection or co-infection with other viruses. If result is PRESUMPTIVE POSTIVE SARS-CoV-2 nucleic acids MAY BE PRESENT.   A presumptive positive result was obtained on the submitted specimen  and confirmed on repeat testing.  While 2019 novel coronavirus  (SARS-CoV-2) nucleic acids may be present in the submitted sample  additional confirmatory testing may be necessary for epidemiological  and / or clinical management purposes  to differentiate between  SARS-CoV-2 and other Sarbecovirus currently known to infect humans.  If clinically indicated additional testing with an alternate test  methodology [id number]) is advised. The SARS-CoV-2 RNA is generally  detectable in upper and lower respiratory sp ecimens during the acute  phase of infection. The expected result is Negative. Fact Sheet for Patients:  StrictlyIdeas.no Fact Sheet for Healthcare Providers: BankingDealers.co.za This test is not yet approved or cleared by the [place] FDA and has been authorized for detection and/or diagnosis of SARS-CoV-2 by FDA under an Emergency Use Authorization (EUA).  This EUA will remain in effect (meaning this test can be used) for the duration of the COVID-19 declaration under Section 564(b)(1) of the Act, 21 U.S.C. section 360bbb-3(b)(1), unless the authorization is terminated or revoked sooner. Performed  at [facility], [address] [place], [place] [address]   Basic metabolic panel     Status: Abnormal   Collection Time: 10/07/18  8:12 PM  Result Value Ref Range   Sodium 128 (L) 135 - 145 mmol/L   Potassium 4.3 3.5 - 5.1 mmol/L   Chloride 95 (L) 98 - 111 mmol/L   CO2 24 22 - 32 mmol/L   Glucose, Bld 398 (H) 70 - 99 mg/dL   BUN 16 8 - 23 mg/dL   Creatinine, Ser 1.03 (H) 0.44 - 1.00 mg/dL   Calcium 8.6 (L) 8.9 - 10.3 mg/dL   GFR calc non Af Amer 57 (L) >60 mL/min   GFR calc Af Amer >60 >60 mL/min   Anion gap 9 5 - 15    Comment: Performed at [facility], [address] [place], [place] [address]  CBC     Status: Abnormal   Collection Time: 10/07/18  8:12 PM  Result Value Ref Range   WBC 7.0 4.0 - 10.5 K/uL   RBC 4.46 3.87 - 5.11 MIL/uL   Hemoglobin 11.6 (L) 12.0 - 15.0 g/dL   HCT 36.8 36.0 - 46.0 %   MCV 82.5 80.0 - 100.0 fL   MCH 26.0 26.0 - 34.0 pg   MCHC 31.5 30.0 - 36.0 g/dL   RDW 15.8 (H) 11.5 - 15.5 %   Platelets 273 150 - 400 K/uL   nRBC 0.0 0.0 - 0.2 %    Comment: Performed at [facility], [address], [place], [place] [address]  Hepatic function panel     Status: Abnormal   Collection Time: 10/07/18  8:12 PM  Result Value Ref Range   Total Protein 6.2 (L) 6.5 - 8.1 g/dL   Albumin 3.2 (L) 3.5 - 5.0 g/dL   AST 12 (L) 15 - 41 U/L   ALT 14 0 - 44 U/L   Alkaline Phosphatase 80 38 - 126 U/L   Total Bilirubin 0.6 0.3 - 1.2 mg/dL   Bilirubin, Direct 0.1 0.0 - 0.2 mg/dL   Indirect Bilirubin 0.5 0.3 - 0.9 mg/dL    Comment: Performed at [facility], [address] [place], [place] [address]  Ethanol  Status: None   Collection Time: 10/07/18  8:12 PM  Result Value Ref Range   Alcohol, Ethyl (B) <10 <10 mg/dL    Comment: (NOTE) Lowest detectable limit for serum alcohol is 10 mg/dL. For medical purposes only. Performed at [facility], [address], [place], [place] [address]     Chemistries  Recent Labs  Lab 10/07/18 2012  NA 128*  K 4.3  CL 95*   CO2 24  GLUCOSE 398*  BUN 16  CREATININE 1.03*  CALCIUM 8.6*  AST 12*  ALT 14  ALKPHOS 80  BILITOT 0.6   ------------------------------------------------------------------------------------------------------------------  ------------------------------------------------------------------------------------------------------------------ GFR: CrCl cannot be calculated (Unknown ideal weight.). Liver Function Tests: Recent Labs  Lab 10/07/18 2012  AST 12*  ALT 14  ALKPHOS 80  BILITOT 0.6  PROT 6.2*  ALBUMIN 3.2*   No results for input(s): LIPASE, AMYLASE in the last 168 hours. No results for input(s): AMMONIA in the last 168 hours. Coagulation Profile: No results for input(s): INR, PROTIME in the last 168 hours. Cardiac Enzymes: No results for input(s): CKTOTAL, CKMB, CKMBINDEX, TROPONINI in the last 168 hours. BNP (last 3 results) No results for input(s): PROBNP in the last 8760 hours. HbA1C: No results for input(s): HGBA1C in the last 72 hours. CBG: Recent Labs  Lab 10/07/18 1933  GLUCAP 383*   Lipid Profile: No results for input(s): CHOL, HDL, LDLCALC, TRIG, CHOLHDL, LDLDIRECT in the last 72 hours. Thyroid Function Tests: No results for input(s): TSH, T4TOTAL, FREET4, T3FREE, THYROIDAB in the last 72 hours. Anemia Panel: No results for input(s): VITAMINB12, FOLATE, FERRITIN, TIBC, IRON, RETICCTPCT in the last 72 hours.  --------------------------------------------------------------------------------------------------------------- Urine analysis:    Component Value Date/Time   COLORURINE YELLOW 11/11/2009 1915   APPEARANCEUR CLEAR 11/11/2009 1915   LABSPEC 1.025 11/11/2009 1915   PHURINE 6.0 11/11/2009 1915   GLUCOSEU 100 (A) 11/11/2009 1915   HGBUR NEGATIVE 11/11/2009 1915   BILIRUBINUR NEGATIVE 11/11/2009 1915   KETONESUR TRACE (A) 11/11/2009 1915   PROTEINUR 30 (A) 11/11/2009 1915   UROBILINOGEN 0.2 11/11/2009 1915   NITRITE NEGATIVE 11/11/2009 1915    LEUKOCYTESUR NEGATIVE 11/11/2009 1915      Imaging Results:    Ct Head Wo Contrast  Result Date: 10/07/2018 CLINICAL DATA:  63 year old female with unexplained altered mental status. EXAM: CT HEAD WITHOUT CONTRAST TECHNIQUE: Contiguous axial images were obtained from the base of the skull through the vertex without intravenous contrast. COMPARISON:  Report of brain MRI 07/03/2001 (no images available). FINDINGS: Brain: Mild lateral and 3rd ventriculomegaly, also described in 2003. The temporal horns remain diminutive. The 4th ventricle has a more normal size. The cerebral aqueduct appears normally visible. No evidence of transependymal edema. No midline shift, mass effect, evidence of mass lesion, intracranial hemorrhage or evidence of cortically based acute infarction. There is a small area of subcortical white matter hypodensity in the left middle frontal gyrus on series 2, image 19. Elsewhere gray-white matter differentiation is within normal limits throughout the brain. No cortical encephalomalacia. Vascular: Calcified atherosclerosis at the skull base. No suspicious intracranial vascular hyperdensity. Skull: Within normal limits. Sinuses/Orbits: Visualized paranasal sinuses and mastoids are clear. Other: Visualized orbits and scalp soft tissues are within normal limits. IMPRESSION: 1.  No acute intracranial abnormality. 2. Chronic ventriculomegaly, similar to that described in 2003 and likely physiologic for this patient. 3. Mild for age nonspecific white matter changes in the left frontal lobe. Electronically Signed   By: Genevie Ann M.D.   On: 10/07/2018 21:51  Dg Chest Port 1 View  Result Date: 10/07/2018 CLINICAL DATA:  Patient with altered mental status. EXAM: PORTABLE CHEST 1 VIEW COMPARISON:  Chest radiograph 11/11/2009 FINDINGS: Cardiac contours upper limits of normal. Aortic atherosclerosis. Diffuse bilateral interstitial pulmonary opacities. No pleural effusion or pneumothorax. IMPRESSION:  Cardiomegaly. Interstitial opacities which may represent pulmonary vascular redistribution and mild interstitial edema. Electronically Signed   By: Lovey Newcomer M.D.   On: 10/07/2018 20:13       Assessment & Plan:    Principal Problem:   AMS (altered mental status) Active Problems:   Diabetes ([place])   Hypertension   Moderate protein-calorie malnutrition (HCC)   Anemia  AMS Unclear etiology Check MRI brain If MRI brain + CVA, then please check carotid ultrasound,   Check hga1c, lipid Aspirin 33m po x1 Lipitor 870mpo x1  Dm2 Cont Levemir Cont Metformin  fsbs ac and qhs, ISS  Hypertension Start losartan 2534mo qday  Moderate protein calorie malnutrition Start Pro-stat 47m76m bid  Anemia Check cbc in am   Cardiomegaly Check cardiac echo  DVT Prophylaxis-   Lovenox - SCDs   AM Labs Ordered, also please review Full Orders  Family Communication: Admission, patients condition and plan of care including tests being ordered have been discussed with the patient  who indicate understanding and agree with the plan and Code Status.  Code Status:  FULL CODE,per patient,  Pt accompanied by family today  Admission status: Observation: Based on patients clinical presentation and evaluation of above clinical data, I have made determination that patient meets observation criteria at this time.    Time spent in minutes : 70 minutes   JameJani Gravel on 10/08/2018 at 12:10 AM

## 2018-10-07 NOTE — ED Provider Notes (Signed)
[facility] EMERGENCY DEPARTMENT Provider Note   CSN: [id number] Arrival date & time: 10/07/18  1909     History   Chief Complaint Chief Complaint  Patient presents with   Hyperglycemia    HPI Tracy Farrell is a 63 y.o. female.     Patient brought in by EMS.  Family members noted that around 12 noon today that she seemed to be acting.  And when they came back at around 6 PM she seemed to be more altered.  They checked her blood sugar was greater than 400.  According to family member patient's sometimes gets confused on blood sugars get high.  But she usually transiently got concerned because it could have lasted for a while.  They called EMS.  Patient reportedly was unable to give date her full name and becomes a little frustrated.  Has trouble putting full sentence together.  When I saw patient she was able to clearly state her name and she is certainly able to follow commands without any difficulty but does have some difficulty putting sentences together no slurred speech.  Did not seem to be having any word finding.  Patient denied any chest pain headache shortness of breath fevers dysuria abdominal pain nausea vomiting or diarrhea.  Past medical history is significant for diabetes hyperlipidemia hypertension restless leg syndrome.     Past Medical History:  Diagnosis Date   Arthritis    Carpal tunnel syndrome, bilateral    Chronic back pain    Depression    Diabetes mellitus    GERD (gastroesophageal reflux disease)    Headache(784.0)    Hyperlipemia    Hypertension    RLS (restless legs syndrome)     Patient Active Problem List   Diagnosis Date Noted   AMS (altered mental status) 10/07/2018    Past Surgical History:  Procedure Laterality Date   ABDOMINAL HYSTERECTOMY     CARPAL TUNNEL RELEASE     rt   CARPAL TUNNEL RELEASE  08/17/2011   Procedure: CARPAL TUNNEL RELEASE;  Surgeon: Wynonia Sours, MD;  Location: [facility];  Service:  Orthopedics;  Laterality: Right;  re-release carpal canal hypothenar fat pad transfer   LUMBAR EPIDURAL INJECTION       OB History   No obstetric history on file.      Home Medications    Prior to Admission medications   Medication Sig Start Date End Date Taking? Authorizing Provider  insulin degludec (TRESIBA FLEXTOUCH) 100 UNIT/ML SOPN FlexTouch Pen Inject 10 Units into the skin daily.   Yes [provider]  metFORMIN (GLUCOPHAGE) 1000 MG tablet Take 1,000 mg by mouth 2 (two) times daily with a meal.   Yes [provider]  omeprazole (PRILOSEC) 20 MG capsule Take 20 mg by mouth daily.   Yes [provider]  rOPINIRole (REQUIP) 1 MG tablet Take 1 mg by mouth at bedtime.   Yes [provider]    Family History No family history on file.  Social History Social History   Tobacco Use   Smoking status: Never Smoker   Smokeless tobacco: Never Used  Substance Use Topics   Alcohol use: No   Drug use: Never     Allergies   Sulfa antibiotics   Review of Systems Review of Systems  Constitutional: Negative for chills and fever.  HENT: Negative for congestion, rhinorrhea and sore throat.   Eyes: Negative for visual disturbance.  Respiratory: Negative for cough and shortness of breath.  Cardiovascular: Negative for chest pain and leg swelling.  Gastrointestinal: Negative for abdominal pain, diarrhea, nausea and vomiting.  Genitourinary: Negative for dysuria.  Musculoskeletal: Negative for back pain and neck pain.  Skin: Negative for rash.  Neurological: Positive for speech difficulty. Negative for dizziness, light-headedness and headaches.  Hematological: Does not bruise/bleed easily.  Psychiatric/Behavioral: Positive for confusion.     Physical Exam Updated Vital Signs BP (!) 162/81    Pulse 75    Temp 99.5 F (37.5 C) (Oral)    Resp 16    SpO2 97%   Physical Exam Vitals signs and nursing note reviewed.  Constitutional:       General: She is not in acute distress.    Appearance: Normal appearance. She is well-developed.  HENT:     Head: Normocephalic and atraumatic.  Eyes:     Extraocular Movements: Extraocular movements intact.     Conjunctiva/sclera: Conjunctivae normal.     Pupils: Pupils are equal, round, and reactive to light.  Neck:     Musculoskeletal: Normal range of motion and neck supple.  Cardiovascular:     Rate and Rhythm: Normal rate and regular rhythm.     Heart sounds: No murmur.  Pulmonary:     Effort: Pulmonary effort is normal. No respiratory distress.     Breath sounds: Normal breath sounds.  Abdominal:     General: Bowel sounds are normal.     Palpations: Abdomen is soft.     Tenderness: There is no abdominal tenderness.  Musculoskeletal: Normal range of motion.  Skin:    General: Skin is warm and dry.     Capillary Refill: Capillary refill takes less than 2 seconds.  Neurological:     General: No focal deficit present.     Mental Status: She is alert and oriented to person, place, and time.     Cranial Nerves: Cranial nerve deficit present.     Sensory: No sensory deficit.     Motor: No weakness.     Comments: Extremity weakness.  No facial weakness.  May be some speech issues.  Just seems to be mostly confused.      ED Treatments / Results  Labs (all labs ordered are listed, but only abnormal results are displayed) Labs Reviewed  BASIC METABOLIC PANEL - Abnormal; Notable for the following components:      Result Value   Sodium 128 (*)    Chloride 95 (*)    Glucose, Bld 398 (*)    Creatinine, Ser 1.03 (*)    Calcium 8.6 (*)    GFR calc non Af Amer 57 (*)    All other components within normal limits  CBC - Abnormal; Notable for the following components:   Hemoglobin 11.6 (*)    RDW 15.8 (*)    All other components within normal limits  HEPATIC FUNCTION PANEL - Abnormal; Notable for the following components:   Total Protein 6.2 (*)    Albumin 3.2 (*)    AST 12  (*)    All other components within normal limits  CBG MONITORING, ED - Abnormal; Notable for the following components:   Glucose-Capillary 383 (*)    All other components within normal limits  SARS CORONAVIRUS 2 (HOSPITAL ORDER, [facility] [facility])  ETHANOL  URINALYSIS, ROUTINE W REFLEX MICROSCOPIC  RAPID URINE DRUG SCREEN, HOSP PERFORMED  CBG MONITORING, ED    EKG None  Radiology Ct Head Wo Contrast  Result Date: 10/07/2018 CLINICAL DATA:  63 year old  female with unexplained altered mental status. EXAM: CT HEAD WITHOUT CONTRAST TECHNIQUE: Contiguous axial images were obtained from the base of the skull through the vertex without intravenous contrast. COMPARISON:  Report of brain MRI 07/03/2001 (no images available). FINDINGS: Brain: Mild lateral and 3rd ventriculomegaly, also described in 2003. The temporal horns remain diminutive. The 4th ventricle has a more normal size. The cerebral aqueduct appears normally visible. No evidence of transependymal edema. No midline shift, mass effect, evidence of mass lesion, intracranial hemorrhage or evidence of cortically based acute infarction. There is a small area of subcortical white matter hypodensity in the left middle frontal gyrus on series 2, image 19. Elsewhere gray-white matter differentiation is within normal limits throughout the brain. No cortical encephalomalacia. Vascular: Calcified atherosclerosis at the skull base. No suspicious intracranial vascular hyperdensity. Skull: Within normal limits. Sinuses/Orbits: Visualized paranasal sinuses and mastoids are clear. Other: Visualized orbits and scalp soft tissues are within normal limits. IMPRESSION: 1.  No acute intracranial abnormality. 2. Chronic ventriculomegaly, similar to that described in 2003 and likely physiologic for this patient. 3. Mild for age nonspecific white matter changes in the left frontal lobe. Electronically Signed   By: Genevie Ann M.D.   On: 10/07/2018 21:51     Dg Chest Port 1 View  Result Date: 10/07/2018 CLINICAL DATA:  Patient with altered mental status. EXAM: PORTABLE CHEST 1 VIEW COMPARISON:  Chest radiograph 11/11/2009 FINDINGS: Cardiac contours upper limits of normal. Aortic atherosclerosis. Diffuse bilateral interstitial pulmonary opacities. No pleural effusion or pneumothorax. IMPRESSION: Cardiomegaly. Interstitial opacities which may represent pulmonary vascular redistribution and mild interstitial edema. Electronically Signed   By: Lovey Newcomer M.D.   On: 10/07/2018 20:13    Procedures Procedures (including critical care time)  Medications Ordered in ED Medications  0.9 %  sodium chloride infusion ( Intravenous New Bag/Given 10/07/18 2012)  sodium chloride 0.9 % bolus 500 mL (0 mLs Intravenous Stopped 10/07/18 2122)  insulin aspart (novoLOG) injection 10 Units (10 Units Intravenous Given 10/07/18 2247)     Initial Impression / Assessment and Plan / ED Course  I have reviewed the triage vital signs and the nursing notes.  Pertinent labs & imaging results that were available during my care of the patient were reviewed by me and considered in my medical decision making (see chart for details).        Patient's initial work-up head CT negative chest x-ray to me with some mild interstitial edema.  Patient without any significant leg swelling.  Patient still seems to be a little confused.  Sodium a little low probably due to pseudohyponatremia.  Blood sugar was in the 398 range.  Patient will receive 10 units of IV insulin.  And patient is receiving 500 cc normal saline bolus and normal saline at a rate of 100 cc/h.  Patient certainly not getting any worse.  Feel that this may be a little bit of a metabolic encephalopathy.  However stroke not completely ruled out.  Head CT without any acute findings.  COVID testing negative.  Urinalysis still pending.  Gust with hospitalist who will admit.  They will evaluate for metabolic and stop of  encephalopathy.  And will consider MRI in the morning to rule out stroke.  Final Clinical Impressions(s) / ED Diagnoses   Final diagnoses:  Hyperglycemia  Acute metabolic encephalopathy    ED Discharge Orders    None       Fredia Sorrow, MD 10/07/18 [phone]

## 2018-10-07 NOTE — ED Triage Notes (Signed)
Pt brought in by [facility] for Hyperglycemia. Per EMS blood glucose was 491 when they arrived. EMS stated that pt was altered and upon speaking with family that pt lives with, pt started acting "odd" around 35 and then when family member returned from errands around 6, pt was very altered so they checked her blood glucose and it was >400 and that's when they called EMS. Pt definetly altered. Unable to give date or full name and becomes easily frustrated when trying to do so and words do not make any sense in regards to questions asked. Dr Rogene Houston notified.

## 2018-10-08 ENCOUNTER — Observation Stay (HOSPITAL_BASED_OUTPATIENT_CLINIC_OR_DEPARTMENT_OTHER): Payer: Medicaid Other

## 2018-10-08 ENCOUNTER — Observation Stay (HOSPITAL_COMMUNITY)
Admit: 2018-10-08 | Discharge: 2018-10-08 | Disposition: A | Discharge status: Home / Self Care | Payer: Medicaid Other | Attending: Internal Medicine | Admitting: Internal Medicine

## 2018-10-08 ENCOUNTER — Observation Stay (HOSPITAL_COMMUNITY): Payer: Medicaid Other

## 2018-10-08 DIAGNOSIS — M549 Dorsalgia, unspecified: Secondary | ICD-10-CM | POA: Diagnosis present

## 2018-10-08 DIAGNOSIS — I10 Essential (primary) hypertension: Secondary | ICD-10-CM | POA: Diagnosis present

## 2018-10-08 DIAGNOSIS — R4182 Altered mental status, unspecified: Secondary | ICD-10-CM | POA: Diagnosis present

## 2018-10-08 DIAGNOSIS — R29701 NIHSS score 1: Secondary | ICD-10-CM | POA: Diagnosis present

## 2018-10-08 DIAGNOSIS — I639 Cerebral infarction, unspecified: Secondary | ICD-10-CM | POA: Diagnosis present

## 2018-10-08 DIAGNOSIS — E440 Moderate protein-calorie malnutrition: Secondary | ICD-10-CM | POA: Diagnosis present

## 2018-10-08 DIAGNOSIS — Z7984 Long term (current) use of oral hypoglycemic drugs: Secondary | ICD-10-CM | POA: Diagnosis not present

## 2018-10-08 DIAGNOSIS — Z20828 Contact with and (suspected) exposure to other viral communicable diseases: Secondary | ICD-10-CM | POA: Diagnosis present

## 2018-10-08 DIAGNOSIS — G2581 Restless legs syndrome: Secondary | ICD-10-CM | POA: Diagnosis present

## 2018-10-08 DIAGNOSIS — D649 Anemia, unspecified: Secondary | ICD-10-CM | POA: Diagnosis present

## 2018-10-08 DIAGNOSIS — E1165 Type 2 diabetes mellitus with hyperglycemia: Secondary | ICD-10-CM | POA: Diagnosis present

## 2018-10-08 DIAGNOSIS — R4701 Aphasia: Secondary | ICD-10-CM | POA: Diagnosis present

## 2018-10-08 DIAGNOSIS — Z882 Allergy status to sulfonamides status: Secondary | ICD-10-CM | POA: Diagnosis not present

## 2018-10-08 DIAGNOSIS — I340 Nonrheumatic mitral (valve) insufficiency: Secondary | ICD-10-CM

## 2018-10-08 DIAGNOSIS — M1990 Unspecified osteoarthritis, unspecified site: Secondary | ICD-10-CM | POA: Diagnosis present

## 2018-10-08 DIAGNOSIS — G40209 Localization-related (focal) (partial) symptomatic epilepsy and epileptic syndromes with complex partial seizures, not intractable, without status epilepticus: Secondary | ICD-10-CM | POA: Diagnosis present

## 2018-10-08 DIAGNOSIS — I361 Nonrheumatic tricuspid (valve) insufficiency: Secondary | ICD-10-CM

## 2018-10-08 DIAGNOSIS — Z8249 Family history of ischemic heart disease and other diseases of the circulatory system: Secondary | ICD-10-CM | POA: Diagnosis not present

## 2018-10-08 DIAGNOSIS — K219 Gastro-esophageal reflux disease without esophagitis: Secondary | ICD-10-CM | POA: Diagnosis present

## 2018-10-08 DIAGNOSIS — Z8673 Personal history of transient ischemic attack (TIA), and cerebral infarction without residual deficits: Secondary | ICD-10-CM | POA: Diagnosis present

## 2018-10-08 DIAGNOSIS — E785 Hyperlipidemia, unspecified: Secondary | ICD-10-CM | POA: Diagnosis present

## 2018-10-08 DIAGNOSIS — Z6837 Body mass index (BMI) 37.0-37.9, adult: Secondary | ICD-10-CM | POA: Diagnosis not present

## 2018-10-08 DIAGNOSIS — G8929 Other chronic pain: Secondary | ICD-10-CM | POA: Diagnosis present

## 2018-10-08 LAB — CBC
HCT: 34.4 % — ABNORMAL LOW (ref 36.0–46.0)
Hemoglobin: 11.2 g/dL — ABNORMAL LOW (ref 12.0–15.0)
MCH: 26.7 pg (ref 26.0–34.0)
MCHC: 32.6 g/dL (ref 30.0–36.0)
MCV: 81.9 fL (ref 80.0–100.0)
Platelets: 264 10*3/uL (ref 150–400)
RBC: 4.2 MIL/uL (ref 3.87–5.11)
RDW: 15.7 % — ABNORMAL HIGH (ref 11.5–15.5)
WBC: 7.3 10*3/uL (ref 4.0–10.5)
nRBC: 0 % (ref 0.0–0.2)

## 2018-10-08 LAB — COMPREHENSIVE METABOLIC PANEL
ALT: 12 U/L (ref 0–44)
AST: 9 U/L — ABNORMAL LOW (ref 15–41)
Albumin: 3 g/dL — ABNORMAL LOW (ref 3.5–5.0)
Alkaline Phosphatase: 67 U/L (ref 38–126)
Anion gap: 9 (ref 5–15)
BUN: 14 mg/dL (ref 8–23)
CO2: 23 mmol/L (ref 22–32)
Calcium: 8.5 mg/dL — ABNORMAL LOW (ref 8.9–10.3)
Chloride: 100 mmol/L (ref 98–111)
Creatinine, Ser: 0.77 mg/dL (ref 0.44–1.00)
GFR calc Af Amer: >60 mL/min (ref >60)
GFR calc non Af Amer: >60 mL/min (ref >60)
Glucose, Bld: 271 mg/dL — ABNORMAL HIGH (ref 70–99)
Potassium: 4.2 mmol/L (ref 3.5–5.1)
Sodium: 132 mmol/L — ABNORMAL LOW (ref 135–145)
Total Bilirubin: 0.7 mg/dL (ref 0.3–1.2)
Total Protein: 5.7 g/dL — ABNORMAL LOW (ref 6.5–8.1)

## 2018-10-08 LAB — URINALYSIS, ROUTINE W REFLEX MICROSCOPIC
Bacteria, UA: NONE SEEN
Bilirubin Urine: NEGATIVE
Glucose, UA: >=500 mg/dL — AB
Hgb urine dipstick: NEGATIVE
Ketones, ur: NEGATIVE mg/dL
Leukocytes,Ua: NEGATIVE
Nitrite: NEGATIVE
Protein, ur: NEGATIVE mg/dL
Specific Gravity, Urine: 1.031 — ABNORMAL HIGH (ref 1.005–1.030)
pH: 6 (ref 5.0–8.0)

## 2018-10-08 LAB — FERRITIN: Ferritin: 18 ng/mL (ref 11–307)

## 2018-10-08 LAB — LIPID PANEL
Cholesterol: 172 mg/dL (ref 0–200)
HDL: 35 mg/dL — ABNORMAL LOW (ref >40)
LDL Cholesterol: 97 mg/dL (ref 0–99)
Total CHOL/HDL Ratio: 4.9 RATIO
Triglycerides: 200 mg/dL — ABNORMAL HIGH (ref <150)
VLDL: 40 mg/dL (ref 0–40)

## 2018-10-08 LAB — GLUCOSE, CAPILLARY
Glucose-Capillary: 238 mg/dL — ABNORMAL HIGH (ref 70–99)
Glucose-Capillary: 273 mg/dL — ABNORMAL HIGH (ref 70–99)
Glucose-Capillary: 275 mg/dL — ABNORMAL HIGH (ref 70–99)
Glucose-Capillary: 277 mg/dL — ABNORMAL HIGH (ref 70–99)
Glucose-Capillary: 311 mg/dL — ABNORMAL HIGH (ref 70–99)

## 2018-10-08 LAB — RETICULOCYTES
Immature Retic Fract: 13.1 % (ref 2.3–15.9)
RBC.: 4.35 MIL/uL (ref 3.87–5.11)
Retic Count, Absolute: 54.8 10*3/uL (ref 19.0–186.0)
Retic Ct Pct: 1.3 % (ref 0.4–3.1)

## 2018-10-08 LAB — IRON AND TIBC
Iron: 35 ug/dL (ref 28–170)
Saturation Ratios: 8 % — ABNORMAL LOW (ref 10.4–31.8)
TIBC: 429 ug/dL (ref 250–450)
UIBC: 394 ug/dL

## 2018-10-08 LAB — RAPID URINE DRUG SCREEN, HOSP PERFORMED
Amphetamines: NOT DETECTED
Barbiturates: NOT DETECTED
Benzodiazepines: NOT DETECTED
Cocaine: NOT DETECTED
Opiates: NOT DETECTED
Tetrahydrocannabinol: NOT DETECTED

## 2018-10-08 LAB — VITAMIN B12
Vitamin B-12: 123 pg/mL — ABNORMAL LOW (ref 180–914)
Vitamin B-12: >7500 pg/mL — ABNORMAL HIGH (ref 180–914)

## 2018-10-08 LAB — ECHOCARDIOGRAM COMPLETE
Height: 62 in
Weight: 3121.71 oz

## 2018-10-08 LAB — SEDIMENTATION RATE: Sed Rate: 37 mm/hr — ABNORMAL HIGH (ref 0–22)

## 2018-10-08 LAB — FOLATE: Folate: 9.8 ng/mL (ref >5.9)

## 2018-10-08 LAB — TSH: TSH: 1.334 u[IU]/mL (ref 0.350–4.500)

## 2018-10-08 MED ORDER — HYDRALAZINE HCL 20 MG/ML IJ SOLN: 5.0000 mg | Freq: Four times a day (QID) | INTRAMUSCULAR | Status: DC | PRN

## 2018-10-08 MED ORDER — CLOPIDOGREL BISULFATE 75 MG PO TABS
75.0000 mg | ORAL_TABLET | Freq: Every day | ORAL | Status: DC
  Administered 2018-10-08 – 2018-10-09 (×2): 75 mg via ORAL
  Filled 2018-10-08 (×2): qty 1

## 2018-10-08 MED ORDER — ASPIRIN 81 MG PO CHEW
324.0000 mg | CHEWABLE_TABLET | Freq: Once | ORAL | Status: AC
  Administered 2018-10-08: 324 mg via ORAL
  Filled 2018-10-08: qty 4

## 2018-10-08 MED ORDER — ACETAMINOPHEN 325 MG PO TABS: 650.0000 mg | ORAL_TABLET | Freq: Four times a day (QID) | ORAL | Status: DC | PRN

## 2018-10-08 MED ORDER — METFORMIN HCL 500 MG PO TABS
1000.0000 mg | ORAL_TABLET | Freq: Two times a day (BID) | ORAL | Status: DC
  Administered 2018-10-08 – 2018-10-09 (×4): 1000 mg via ORAL
  Filled 2018-10-08 (×5): qty 2

## 2018-10-08 MED ORDER — ATORVASTATIN CALCIUM 40 MG PO TABS
40.0000 mg | ORAL_TABLET | Freq: Every day | ORAL | Status: DC
  Administered 2018-10-08 – 2018-10-09 (×2): 40 mg via ORAL
  Filled 2018-10-08 (×2): qty 1

## 2018-10-08 MED ORDER — INSULIN DEGLUDEC 100 UNIT/ML ~~LOC~~ SOPN
10.0000 [IU] | PEN_INJECTOR | Freq: Every day | SUBCUTANEOUS | Status: DC
  Filled 2018-10-08: qty 3

## 2018-10-08 MED ORDER — CYANOCOBALAMIN 1000 MCG/ML IJ SOLN
1000.0000 ug | Freq: Once | INTRAMUSCULAR | Status: AC
  Administered 2018-10-08: 1000 ug via INTRAMUSCULAR
  Filled 2018-10-08: qty 1

## 2018-10-08 MED ORDER — LEVETIRACETAM IN NACL 500 MG/100ML IV SOLN
500.0000 mg | Freq: Two times a day (BID) | INTRAVENOUS | Status: DC
  Administered 2018-10-08 – 2018-10-09 (×2): 500 mg via INTRAVENOUS
  Filled 2018-10-08 (×3): qty 100

## 2018-10-08 MED ORDER — ATORVASTATIN CALCIUM 40 MG PO TABS
80.0000 mg | ORAL_TABLET | Freq: Every day | ORAL | Status: DC
  Filled 2018-10-08: qty 2

## 2018-10-08 MED ORDER — INSULIN GLARGINE 100 UNIT/ML ~~LOC~~ SOLN
15.0000 [IU] | Freq: Every day | SUBCUTANEOUS | Status: DC
  Administered 2018-10-08 – 2018-10-09 (×2): 15 [IU] via SUBCUTANEOUS
  Filled 2018-10-08 (×3): qty 0.15

## 2018-10-08 MED ORDER — ASPIRIN EC 81 MG PO TBEC
81.0000 mg | DELAYED_RELEASE_TABLET | Freq: Every day | ORAL | Status: DC
  Administered 2018-10-08 – 2018-10-09 (×2): 81 mg via ORAL
  Filled 2018-10-08 (×2): qty 1

## 2018-10-08 MED ORDER — ENOXAPARIN SODIUM 40 MG/0.4ML ~~LOC~~ SOLN
40.0000 mg | SUBCUTANEOUS | Status: DC
  Administered 2018-10-08 (×2): 40 mg via SUBCUTANEOUS
  Filled 2018-10-08 (×2): qty 0.4

## 2018-10-08 MED ORDER — PANTOPRAZOLE SODIUM 40 MG PO TBEC
40.0000 mg | DELAYED_RELEASE_TABLET | Freq: Every day | ORAL | Status: DC
  Administered 2018-10-08 – 2018-10-09 (×2): 40 mg via ORAL
  Filled 2018-10-08 (×2): qty 1

## 2018-10-08 MED ORDER — SODIUM CHLORIDE 0.9 % IV SOLN: 250.0000 mL | INTRAVENOUS | Status: DC | PRN

## 2018-10-08 MED ORDER — SODIUM CHLORIDE 0.9% FLUSH: 3.0000 mL | INTRAVENOUS | Status: DC | PRN

## 2018-10-08 MED ORDER — ACETAMINOPHEN 650 MG RE SUPP: 650.0000 mg | Freq: Four times a day (QID) | RECTAL | Status: DC | PRN

## 2018-10-08 MED ORDER — ROPINIROLE HCL 1 MG PO TABS
1.0000 mg | ORAL_TABLET | Freq: Every day | ORAL | Status: DC
  Administered 2018-10-08 (×2): 1 mg via ORAL
  Filled 2018-10-08 (×2): qty 1

## 2018-10-08 MED ORDER — LOSARTAN POTASSIUM 50 MG PO TABS
25.0000 mg | ORAL_TABLET | Freq: Every day | ORAL | Status: DC
  Administered 2018-10-08 – 2018-10-09 (×2): 25 mg via ORAL
  Filled 2018-10-08 (×2): qty 1

## 2018-10-08 NOTE — Consult Note (Addendum)
Tracy A. Merlene Laughter, MD     [email or web address]           EDIS DRACH is an 63 y.o. female.   ASSESSMENT/PLAN: 1.  Recurrent episodes of aphasia/altered mental status: The differential diagnosis includes ischemia, post lesional epilepsy or post lesional nonepileptic events.  The patient will be treated both for ischemic event and also for epileptic events.  The EEG findings are concerning given the widespread slowing on the left side.  The findings seem to suggest post ictal lateral slowing.  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  Keppra has been started which is appropriate.  This can be switched to oral formulation.  She is on dual antiplatelet agents which is okay for the next 2 weeks.  Subsequently however a single agent is recommended.  The patient will need blood sugar control.    Patient is a 63 year old female who presents with episode of confusion and unresponsiveness associated with word finding difficulties.  It appears that the patient has had several of these events over the last month.  The most recent event seem to be longer however.  The granddaughter reports that they typically last about 20 to 30 minutes.  She complains of having abnormal sensation involving the right upper and lower extremity with this most recent event.  The daughter reports that the events seem to occur with her blood sugars run high but frankly her blood sugars have been running quite high recently even without the spells.  She does report having some headaches.  She denies palpitation, chest pain or shortness of breath.  The review of systems otherwise negative.    GENERAL: This is a pleasant female who is doing well at this time.  HEENT: The neck is supple no trauma appreciated.  ABDOMEN: soft  EXTREMITIES: No edema   BACK: There is  normal.  SKIN: Normal by inspection.    MENTAL STATUS: Alert and oriented. Speech, language and cognition are generally intact. Judgment and insight normal.   CRANIAL NERVES: Pupils are equal, round and reactive to light and accomodation; extra ocular movements are full, there is no significant nystagmus; visual fields are full; upper and lower facial muscles are normal in strength and symmetric, there is no flattening of the nasolabial folds; tongue is midline; uvula is midline; shoulder elevation is normal.  MOTOR: There is mild drift of the right leg.  Right hip flexion is 5 and dorsiflexion 4+.  The right upper extremity shows normal tone, bulk and strength without any drift.  The left side is normal.  There is no drift on the left side.  COORDINATION: Left finger to nose is normal, right finger to nose is normal, No rest tremor; no intention tremor; no postural tremor; no bradykinesia.  REFLEXES: Deep tendon reflexes are symmetrical and normal. Plantar reflexes are flexor bilaterally.   SENSATION: Normal to light touch, temperature, and pain.   NIH stroke scale 1.    Blood pressure (!) 157/77, pulse 81, temperature 97.9 F (36.6 C), temperature source Oral, resp. rate 20, height 5\' 2"  (1.575 m), weight 88.5 kg, SpO2 99 %.  Past Medical History:  Diagnosis Date  . Arthritis   . Carpal tunnel syndrome, bilateral   . Chronic back pain   . Depression   . Diabetes mellitus   . GERD (gastroesophageal reflux disease)   .  Headache(784.0)   . Hyperlipemia   . Hypertension   . RLS (restless legs syndrome)     Past Surgical History:  Procedure Laterality Date  . ABDOMINAL HYSTERECTOMY    . CARPAL TUNNEL RELEASE     rt  . CARPAL TUNNEL RELEASE  08/17/2011   Procedure: CARPAL TUNNEL RELEASE;  Surgeon: Wynonia Sours, MD;  Location: [facility];  Service: Orthopedics;  Laterality: Right;  re-release carpal canal hypothenar fat pad transfer  . LUMBAR EPIDURAL INJECTION       No family history on file.  Social History:  reports that she has never smoked. She has never used smokeless tobacco. She reports that she does not drink alcohol or use drugs.  Allergies:  Allergies  Allergen Reactions  . Sulfa Antibiotics Other (See Comments)    UNKNOWN REACTION    Medications: Prior to Admission medications   Medication Sig Start Date End Date Taking? Authorizing Provider  insulin degludec (TRESIBA FLEXTOUCH) 100 UNIT/ML SOPN FlexTouch Pen Inject 10 Units into the skin daily.   Yes [provider]  metFORMIN (GLUCOPHAGE) 1000 MG tablet Take 1,000 mg by mouth 2 (two) times daily with a meal.   Yes [provider]  omeprazole (PRILOSEC) 20 MG capsule Take 20 mg by mouth daily.   Yes [provider]  rOPINIRole (REQUIP) 1 MG tablet Take 1 mg by mouth at bedtime.   Yes [provider]    Scheduled Meds: . aspirin EC  81 mg Oral Daily  . atorvastatin  40 mg Oral q1800  . clopidogrel  75 mg Oral Daily  . enoxaparin (LOVENOX) injection  40 mg Subcutaneous Q24H  . insulin aspart  0-5 Units Subcutaneous QHS  . insulin aspart  0-9 Units Subcutaneous TID WC  . insulin glargine  15 Units Subcutaneous Daily  . losartan  25 mg Oral Daily  . metFORMIN  1,000 mg Oral BID WC  . pantoprazole  40 mg Oral Daily  . rOPINIRole  1 mg Oral QHS   Continuous Infusions: . sodium chloride 100 mL/hr at 10/07/18 2012  . sodium chloride    . levETIRAcetam 500 mg (10/08/18 1658)   PRN Meds:.sodium chloride, acetaminophen **OR** acetaminophen, hydrALAZINE, sodium chloride flush     Results for orders placed or performed during the hospital encounter of 10/07/18 (from the past 48 hour(s))  CBG monitoring, ED     Status: Abnormal   Collection Time: 10/07/18  7:33 PM  Result Value Ref Range   Glucose-Capillary 383 (H) 70 - 99 mg/dL  SARS Coronavirus 2 [facility] order, Performed in [facility] hospital lab) Nasopharyngeal Nasopharyngeal Swab      Status: None   Collection Time: 10/07/18  8:04 PM   Specimen: Nasopharyngeal Swab  Result Value Ref Range   SARS Coronavirus 2 NEGATIVE NEGATIVE    Comment: (NOTE) If result is NEGATIVE SARS-CoV-2 target nucleic acids are NOT DETECTED. The SARS-CoV-2 RNA is generally detectable in upper and lower  respiratory specimens during the acute phase of infection. The lowest  concentration of SARS-CoV-2 viral copies this assay can detect is 250  copies / mL. A negative result does not preclude SARS-CoV-2 infection  and should not be used as the sole basis for treatment or other  patient management decisions.  A negative result may occur with  improper specimen collection / handling, submission of specimen other  than nasopharyngeal swab, presence of viral mutation(s) within the  areas targeted by this assay, and inadequate number of  viral copies  (<250 copies / mL). A negative result must be combined with clinical  observations, patient history, and epidemiological information. If result is POSITIVE SARS-CoV-2 target nucleic acids are DETECTED. The SARS-CoV-2 RNA is generally detectable in upper and lower  respiratory specimens dur ing the acute phase of infection.  Positive  results are indicative of active infection with SARS-CoV-2.  Clinical  correlation with patient history and other diagnostic information is  necessary to determine patient infection status.  Positive results do  not rule out bacterial infection or co-infection with other viruses. If result is PRESUMPTIVE POSTIVE SARS-CoV-2 nucleic acids MAY BE PRESENT.   A presumptive positive result was obtained on the submitted specimen  and confirmed on repeat testing.  While 2019 novel coronavirus  (SARS-CoV-2) nucleic acids may be present in the submitted sample  additional confirmatory testing may be necessary for epidemiological  and / or clinical management purposes  to differentiate between  SARS-CoV-2 and other Sarbecovirus  currently known to infect humans.  If clinically indicated additional testing with an alternate test  methodology [id number]) is advised. The SARS-CoV-2 RNA is generally  detectable in upper and lower respiratory sp ecimens during the acute  phase of infection. The expected result is Negative. Fact Sheet for Patients:  StrictlyIdeas.no Fact Sheet for Healthcare Providers: BankingDealers.co.za This test is not yet approved or cleared by the [place] FDA and has been authorized for detection and/or diagnosis of SARS-CoV-2 by FDA under an Emergency Use Authorization (EUA).  This EUA will remain in effect (meaning this test can be used) for the duration of the COVID-19 declaration under Section 564(b)(1) of the Act, 21 U.S.C. section 360bbb-3(b)(1), unless the authorization is terminated or revoked sooner. Performed at [facility], [address] [place], [place] [id number]   Basic metabolic panel     Status: Abnormal   Collection Time: 10/07/18  8:12 PM  Result Value Ref Range   Sodium 128 (L) 135 - 145 mmol/L   Potassium 4.3 3.5 - 5.1 mmol/L   Chloride 95 (L) 98 - 111 mmol/L   CO2 24 22 - 32 mmol/L   Glucose, Bld 398 (H) 70 - 99 mg/dL   BUN 16 8 - 23 mg/dL   Creatinine, Ser 1.03 (H) 0.44 - 1.00 mg/dL   Calcium 8.6 (L) 8.9 - 10.3 mg/dL   GFR calc non Af Amer 57 (L) >60 mL/min   GFR calc Af Amer >60 >60 mL/min   Anion gap 9 5 - 15    Comment: Performed at [facility], [address], [place], [place] [address]  CBC     Status: Abnormal   Collection Time: 10/07/18  8:12 PM  Result Value Ref Range   WBC 7.0 4.0 - 10.5 K/uL   RBC 4.46 3.87 - 5.11 MIL/uL   Hemoglobin 11.6 (L) 12.0 - 15.0 g/dL   HCT 36.8 36.0 - 46.0 %   MCV 82.5 80.0 - 100.0 fL   MCH 26.0 26.0 - 34.0 pg   MCHC 31.5 30.0 - 36.0 g/dL   RDW 15.8 (H) 11.5 - 15.5 %   Platelets 273 150 - 400 K/uL   nRBC 0.0 0.0 - 0.2 %    Comment: Performed at [facility], [address] [place], [place] [address]  Hepatic function panel     Status: Abnormal   Collection Time: 10/07/18  8:12 PM  Result Value Ref Range   Total Protein 6.2 (L) 6.5 - 8.1 g/dL   Albumin 3.2 (  L) 3.5 - 5.0 g/dL   AST 12 (L) 15 - 41 U/L   ALT 14 0 - 44 U/L   Alkaline Phosphatase 80 38 - 126 U/L   Total Bilirubin 0.6 0.3 - 1.2 mg/dL   Bilirubin, Direct 0.1 0.0 - 0.2 mg/dL   Indirect Bilirubin 0.5 0.3 - 0.9 mg/dL    Comment: Performed at [facility], [address], [place], [place] [address]  Ethanol     Status: None   Collection Time: 10/07/18  8:12 PM  Result Value Ref Range   Alcohol, Ethyl (B) <10 <10 mg/dL    Comment: (NOTE) Lowest detectable limit for serum alcohol is 10 mg/dL. For medical purposes only. Performed at [facility], [address] [place], [place] [address]   Urinalysis, Routine w reflex microscopic     Status: Abnormal   Collection Time: 10/08/18 12:23 AM  Result Value Ref Range   Color, Urine YELLOW YELLOW   APPearance CLEAR CLEAR   Specific Gravity, Urine 1.031 (H) 1.005 - 1.030   pH 6.0 5.0 - 8.0   Glucose, UA >=500 (A) NEGATIVE mg/dL   Hgb urine dipstick NEGATIVE NEGATIVE   Bilirubin Urine NEGATIVE NEGATIVE   Ketones, ur NEGATIVE NEGATIVE mg/dL   Protein, ur NEGATIVE NEGATIVE mg/dL   Nitrite NEGATIVE NEGATIVE   Leukocytes,Ua NEGATIVE NEGATIVE   RBC / HPF 0-5 0 - 5 RBC/hpf   WBC, UA 0-5 0 - 5 WBC/hpf   Bacteria, UA NONE SEEN NONE SEEN   Squamous Epithelial / LPF 0-5 0 - 5    Comment: Performed at [facility], [address] [place], [place] [address]  Rapid urine drug screen (hospital performed)     Status: None   Collection Time: 10/08/18 12:23 AM  Result Value Ref Range   Opiates NONE DETECTED NONE DETECTED   Cocaine NONE DETECTED NONE DETECTED   Benzodiazepines NONE DETECTED NONE DETECTED   Amphetamines NONE DETECTED NONE DETECTED   Tetrahydrocannabinol NONE DETECTED NONE DETECTED   Barbiturates NONE DETECTED NONE DETECTED    Comment: (NOTE)  DRUG SCREEN FOR MEDICAL PURPOSES ONLY.  IF CONFIRMATION IS NEEDED FOR ANY PURPOSE, NOTIFY LAB WITHIN 5 DAYS. LOWEST DETECTABLE LIMITS FOR URINE DRUG SCREEN Drug Class                     Cutoff (ng/mL) Amphetamine and metabolites    1000 Barbiturate and metabolites    200 Benzodiazepine                 [id number] Tricyclics and metabolites     300 Opiates and metabolites        300 Cocaine and metabolites        300 THC                            50 Performed at [facility], [address], [place], [place] [address]   Glucose, capillary     Status: Abnormal   Collection Time: 10/08/18 12:29 AM  Result Value Ref Range   Glucose-Capillary 238 (H) 70 - 99 mg/dL  Sedimentation rate     Status: Abnormal   Collection Time: 10/08/18  4:35 AM  Result Value Ref Range   Sed Rate 37 (H) 0 - 22 mm/hr    Comment: Performed at [facility], [address] [place], [place] [address]  TSH     Status: None   Collection Time: 10/08/18  4:35 AM  Result Value Ref Range   TSH 1.334 0.350 - 4.500 uIU/mL    Comment: Performed by a 3rd Generation assay with a functional sensitivity of <=0.01 uIU/mL. Performed at [facility], [address], [place], [place] [address]   Vitamin B12     Status: Abnormal   Collection Time: 10/08/18  4:35 AM  Result Value Ref Range   Vitamin B-12 123 (L) 180 - 914 pg/mL    Comment: (NOTE) This assay is not validated for testing neonatal or myeloproliferative syndrome specimens for Vitamin B12 levels. Performed at [facility], [address], [place], [place] [address]   Lipid panel     Status: Abnormal   Collection Time: 10/08/18  4:35 AM  Result Value Ref Range   Cholesterol 172 0 - 200 mg/dL   Triglycerides 200 (H) <150 mg/dL   HDL 35 (L) >40 mg/dL   Total CHOL/HDL Ratio 4.9 RATIO   VLDL 40 0 - 40 mg/dL   LDL Cholesterol 97 0 - 99 mg/dL    Comment:        Total Cholesterol/HDL:CHD Risk Coronary Heart Disease Risk Table                     Men   Women  1/2  Average Risk   3.4   3.3  Average Risk       5.0   4.4  2 X Average Risk   9.6   7.1  3 X Average Risk  23.4   11.0        Use the calculated Patient Ratio above and the CHD Risk Table to determine the patient's CHD Risk.        ATP III CLASSIFICATION (LDL):  <100     mg/dL   Optimal  100-129  mg/dL   Near or Above                    Optimal  130-159  mg/dL   Borderline  160-189  mg/dL   High  >190     mg/dL   Very High Performed at [facility], [address] [place], [place] [address]   CBC     Status: Abnormal   Collection Time: 10/08/18  4:35 AM  Result Value Ref Range   WBC 7.3 4.0 - 10.5 K/uL   RBC 4.20 3.87 - 5.11 MIL/uL   Hemoglobin 11.2 (L) 12.0 - 15.0 g/dL   HCT 34.4 (L) 36.0 - 46.0 %   MCV 81.9 80.0 - 100.0 fL   MCH 26.7 26.0 - 34.0 pg   MCHC 32.6 30.0 - 36.0 g/dL   RDW 15.7 (H) 11.5 - 15.5 %   Platelets 264 150 - 400 K/uL   nRBC 0.0 0.0 - 0.2 %    Comment: Performed at [facility], [address], [place], [place] [address]  Comprehensive metabolic panel     Status: Abnormal   Collection Time: 10/08/18  4:35 AM  Result Value Ref Range   Sodium 132 (L) 135 - 145 mmol/L   Potassium 4.2 3.5 - 5.1 mmol/L   Chloride 100 98 - 111 mmol/L   CO2 23 22 - 32 mmol/L   Glucose, Bld 271 (H) 70 - 99 mg/dL   BUN 14 8 - 23 mg/dL   Creatinine, Ser 0.77 0.44 - 1.00 mg/dL   Calcium 8.5 (L) 8.9 - 10.3 mg/dL   Total Protein 5.7 (L) 6.5 - 8.1 g/dL   Albumin 3.0 (L) 3.5 -  5.0 g/dL   AST 9 (L) 15 - 41 U/L   ALT 12 0 - 44 U/L   Alkaline Phosphatase 67 38 - 126 U/L   Total Bilirubin 0.7 0.3 - 1.2 mg/dL   GFR calc non Af Amer >60 >60 mL/min   GFR calc Af Amer >60 >60 mL/min   Anion gap 9 5 - 15    Comment: Performed at [facility], [address] [place], [place] [address]  Glucose, capillary     Status: Abnormal   Collection Time: 10/08/18  8:19 AM  Result Value Ref Range   Glucose-Capillary 277 (H) 70 - 99 mg/dL  Glucose, capillary     Status: Abnormal   Collection Time:  10/08/18 11:51 AM  Result Value Ref Range   Glucose-Capillary 275 (H) 70 - 99 mg/dL  Folate     Status: None   Collection Time: 10/08/18  4:24 PM  Result Value Ref Range   Folate 9.8 >5.9 ng/mL    Comment: Performed at [facility], [address] [place], [place] [address]  Reticulocytes     Status: None   Collection Time: 10/08/18  4:24 PM  Result Value Ref Range   Retic Ct Pct 1.3 0.4 - 3.1 %   RBC. 4.35 3.87 - 5.11 MIL/uL   Retic Count, Absolute 54.8 19.0 - 186.0 K/uL   Immature Retic Fract 13.1 2.3 - 15.9 %    Comment: Performed at [facility], [address], [place], [place] [address]  Glucose, capillary     Status: Abnormal   Collection Time: 10/08/18  4:47 PM  Result Value Ref Range   Glucose-Capillary 311 (H) 70 - 99 mg/dL   Comment 1 Notify RN    Comment 2 Document in Chart     Studies/Results:  EEG EEG showed continuous 2-5hz  theta-delta slowing in left hemisphere, maximal in left frontotemporal region. At times, the slowing became more rhythmic but no clear evolution was seen to suggest seizure. During awake state, no clear posterior dominant rhythm was seen. There was an excessive amount of 15 to 18 Hz, 2-3 uV beta activity with irregular morphology distributed symmetrically and diffusely. During sleep, sleep spindles (12-14hz ) maximal frontocentral region were seen. Hyperventilation and photic stimulation were not performed.  IMPRESSION: This study is suggestive of cortical dysfunction in left hemisphere, maximal left frontotemporal region secondary to underlying structural abnormality. At times, rhythmic theta-delta slowing was seen in left frontotemporal region which could be on the ictal-interictal continuum.  No definite seizures or epileptiform discharges were seen throughout the recording.  The excessive beta activity seen in the background is most likely due to the effect of benzodiazepine and is a benign EEG pattern.     BRAIN MRI MRA FINDINGS: MRI  HEAD FINDINGS  Brain: There is a small amount of linear trace diffusion signal hyperintensity in the left corona radiata with normal ADC and T2/FLAIR hyperintensity likely reflecting a subacute infarct. Elsewhere, no restricted diffusion is seen to indicate an acute infarct. Scattered small foci of T2 hyperintensity elsewhere in the cerebral white matter are nonspecific but compatible with minimal chronic small vessel ischemic disease, not greater than expected for age. Mild lateral and third ventriculomegaly is again noted and favored to reflect central predominant cerebral atrophy rather than hydrocephalus. No intracranial hemorrhage, mass, midline shift, or extra-axial fluid collection is identified.  Vascular: Major intracranial vascular flow voids are preserved.  Skull and upper cervical spine: Unremarkable bone marrow signal.  Sinuses/Orbits: Unremarkable orbits. Paranasal sinuses and  mastoid air cells are clear.  Other: None.  MRA HEAD FINDINGS  The visualized distal vertebral arteries are widely patent to the basilar and codominant. Patent PICAs and SCAs are seen bilaterally. The basilar artery is widely patent. There is a moderately large right posterior communicating artery. Both PCAs are patent without evidence of significant proximal stenosis.  The internal carotid arteries are widely patent from skull base to carotid termini. ACAs and MCAs are patent without evidence of proximal branch occlusion or significant proximal stenosis. No aneurysm is identified.  IMPRESSION: 1. 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 2. Mild cerebral atrophy. 3. Negative head MRA.     CAROTID IMPRESSION: Color duplex indicates minimal heterogeneous plaque, with no hemodynamically significant stenosis by duplex criteria in the extracranial cerebrovascular circulation.    TTE  1. The left ventricle has a visually estimated ejection fraction of 55%. The  cavity size was normal. Moderate focal basal septal hypertrophy. Otherwise mild concentric LVH of remaining myocardium. Left ventricular diastolic Doppler parameters are  consistent with impaired relaxation. Elevated left ventricular end-diastolic pressure.  2. The right ventricle has normal systolic function. The cavity was normal. There is mildly increased right ventricular wall thickness.  3. The aortic valve is tricuspid. Mild thickening of the aortic valve. Mild calcification of the aortic valve. Moderate aortic annular calcification noted.  4. The mitral valve is grossly normal.  5. The tricuspid valve is grossly normal.  6. The aorta is normal unless otherwise noted.     Brain MRI scan reviewed impression.  There is a mildly bright lesion involving the left corona radiata but extending to the cortex.  No hemorrhages appreciated.  No changes on ADC scan.  There appears to be some evidence of mild ventriculomegaly out of proportion to the degree of atrophy.  MRA shows no occlusive disease.   Semaj Coburn A. Merlene Farrell, M.D.  Diplomate, Tax adviser of Psychiatry and Neurology ( Neurology). 10/08/2018, 6:06 PM

## 2018-10-08 NOTE — Procedures (Signed)
Patient Name: Tracy Farrell  MRN: [id number]  Epilepsy Attending: Lora Havens  Referring Physician/Provider: Dr Heath Lark Date: 10/08/2018 Duration: 25.21 mins  Patient history: 63yo F with left corona radiata CVA and ams. EEG to evaluate for seizure.   Level of alertness: awake  AEDs during EEG study: None  Technical aspects: This EEG study was done with scalp electrodes positioned according to the 10-20 International system of electrode placement. Electrical activity was acquired at a sampling rate of 500Hz  and reviewed with a high frequency filter of 70Hz  and a low frequency filter of 1Hz . EEG data were recorded continuously and digitally stored.   DESCRIPTION: EEG showed continuous 2-5hz  theta-delta slowing in left hemisphere, maximal in left frontotemporal region. At times, the slowing became more rhythmic but no clear evolution was seen to suggest seizure. During awake state, no clear posterior dominant rhythm was seen. There was an excessive amount of 15 to 18 Hz, 2-3 uV beta activity with irregular morphology distributed symmetrically and diffusely. During sleep, sleep spindles (12-14hz ) maximal frontocentral region were seen. Hyperventilation and photic stimulation were not performed.  IMPRESSION: This study is suggestive of cortical dysfunction in left hemisphere, maximal left frontotemporal region secondary to underlying structural abnormality. At times, rhythmic theta-delta slowing was seen in left frontotemporal region which could be on the ictal-interictal continuum.  No definite seizures or epileptiform discharges were seen throughout the recording.  The excessive beta activity seen in the background is most likely due to the effect of benzodiazepine and is a benign EEG pattern.   Sherryll Skoczylas Barbra Sarks

## 2018-10-08 NOTE — Progress Notes (Addendum)
Inpatient Diabetes Program Recommendations  AACE/ADA: New Consensus Statement on Inpatient Glycemic Control   Target Ranges:  Prepandial:   less than 140 mg/dL      Peak postprandial:   less than 180 mg/dL (1-2 hours)      Critically ill patients:  140 - 180 mg/dL   Results for ROQUEL, CARNATHAN (MRN [id number]) as of 10/08/2018 08:29  Ref. Range 10/07/2018 19:33 10/08/2018 00:29 10/08/2018 08:19  Glucose-Capillary Latest Ref Range: 70 - 99 mg/dL 383 (H) 238 (H) 277 (H)  Results for CHANEA, VANDERZANDEN (MRN [id number]) as of 10/08/2018 08:29  Ref. Range 10/07/2018 20:12 10/08/2018 04:35  Glucose Latest Ref Range: 70 - 99 mg/dL 398 (H) 271 (H)   Review of Glycemic Control  Diabetes history: DM2  Outpatient Diabetes medications: Tresiba 10 units daily, Metformin 1000 mg BID Current orders for Inpatient glycemic control: Tresiba 10 units daily, Metformin 1000 mg BID, Novolog 0-9 units TID with meals, Novolog 0-5 units QHS  Inpatient Diabetes Program Recommendations:   Insulin-Basal: [name] is not on formulary. Please discontinue [name] and order Lantus 15 units daily.  HbgA1C:  A1C in process.  NOTE: Spoke with patient over the phone. Patient continues to have word finding difficulty and not able to answer some questions. Patient confirms that she is taking Metformin 1000 mg BID and Tresiba 10-12 units for DM.  Patient states that her glucose has been running in the 400-500 mg/dl for months. Patient reports that she sees PCP for DM management and she could not remember name of PCP but last time she seen PCP was in April.  Patient reports that she has not been in close contact with PCP about hyperglycemia. In further discussing DM control patient gave the phone to her granddaughter. Patient's granddaughter states that patient's PCP is Dr. [name] and she confirms that patient has not seen PCP since April and that patient's glucose has been very high for months. Patient's granddaughter states that patient  currently does not have any insurance. Noted CM note in chart indicating patient is paying out of pocket for medications. Of note, [name] cash price is over $520 on GoodRx.  Will try to talk with patient again tomorrow.  Thanks, Barnie Alderman, RN, MSN, CDE Diabetes Coordinator Inpatient Diabetes Program [phone] (Team Pager from 8am to 5pm)

## 2018-10-08 NOTE — Progress Notes (Signed)
PROGRESS NOTE    Tracy Farrell  [id number] DOB: August 28, 1955 DOA: 10/07/2018 PCP: Scotty Court, DO   Brief Narrative:  Per HPI: Tracy Farrell  is a 63 y.o. female, w Dm2, RLS, apparently presents with c/o confusion, intermittently over the past 1 month, particularly when her sugar is high.  Family member states has had confusion since this morning.  Pt is currently axoxo3 (person, place, year).  Pt has slight expressive aphasia.  Word finding difficulty.  Pt denies vision change, headache, numbness, tingling, weakness, cp, palp, sob,    9/14: Patient continues to have periods of confusion as well as neurological sequelae of numbness and some weakness.  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  Assessment & Plan:   Principal Problem:   AMS (altered mental status) Active Problems:   Diabetes ([place])   Hypertension   Moderate protein-calorie malnutrition ([place])   Anemia   Acute encephalopathy likely secondary to subacute left corona radiata CVA -Start atorvastatin 40 mg daily -Start aspirin 81 mg daily as well as Plavix 75 mg daily -Appreciate neurology evaluation -PT evaluation with no home health needs noted -2D echocardiogram with LVEF 55% with concentric LVH noted -Carotid ultrasound ordered and pending  Type 2 diabetes -Continue Levemir and metformin -Appreciate diabetes coordinator recommendations -Hemoglobin A1c pending  Hypertension -Continue losartan 25 mg p.o. daily  Moderate protein calorie malnutrition -Pro-stat  Anemia -Appears stable -We will check anemia panel  Cardiomegaly -2D echocardiogram with LVEF 55% and concentric LVH noted -Keep on ARB as prescribed   DVT prophylaxis: Lovenox Code Status: Full Family Communication: Discussed with granddaughter at bedside Disposition Plan: Neurology evaluation for CVA.  Continues to have ongoing symptomatology.  Anticipate discharge in a.m. if stable and improved.   Consultants:    Neurology  Procedures:   Brain MRI/MRA 9/14  Antimicrobials:   None   Subjective: Patient seen and evaluated today with ongoing oral numbness and extremity numbness/weakness.  Objective: Vitals:   10/07/18 2300 10/07/18 2330 10/08/18 0028 10/08/18 0455  BP: (!) 175/78 (!) 165/80 (!) 168/92 (!) 142/70  Pulse: 86 81 87 81  Resp: 17 17 18 16   Temp:   98.8 F (37.1 C) 99.1 F (37.3 C)  TempSrc:   Oral Oral  SpO2: 97% 97% 99% 95%  Weight:   87.7 kg 88.5 kg  Height:   5\' 2"  (1.575 m)     Intake/Output Summary (Last 24 hours) at 10/08/2018 1448 Last data filed at 10/08/2018 0711 Gross per 24 hour  Intake 1280 ml  Output 1100 ml  Net 180 ml   Filed Weights   10/08/18 0028 10/08/18 0455  Weight: 87.7 kg 88.5 kg    Examination:  General exam: Appears calm and comfortable, still confused Respiratory system: Clear to auscultation. Respiratory effort normal. Cardiovascular system: S1 & S2 heard, RRR. No JVD, murmurs, rubs, gallops or clicks. No pedal edema. Gastrointestinal system: Abdomen is nondistended, soft and nontender. No organomegaly or masses felt. Normal bowel sounds heard. Central nervous system: Alert and awake Extremities: No significant edema Skin: No rashes, lesions or ulcers Psychiatry: Difficult to assess given confusion    Data Reviewed: I have personally reviewed following labs and imaging studies  CBC: Recent Labs  Lab 10/07/18 2012 10/08/18 0435  WBC 7.0 7.3  HGB 11.6* 11.2*  HCT 36.8 34.4*  MCV 82.5 81.9  PLT 273 [id number]   Basic Metabolic Panel: Recent Labs  Lab 10/07/18 2012 10/08/18 0435  NA 128* 132*  K 4.3 4.2  CL 95* 100  CO2 24 23  GLUCOSE 398* 271*  BUN 16 14  CREATININE 1.03* 0.77  CALCIUM 8.6* 8.5*   GFR: Estimated Creatinine Clearance: 73.5 mL/min (by C-G formula based on SCr of 0.77 mg/dL). Liver Function Tests: Recent Labs  Lab 10/07/18 2012 10/08/18 0435  AST 12* 9*  ALT 14 12  ALKPHOS 80 67  BILITOT 0.6  0.7  PROT 6.2* 5.7*  ALBUMIN 3.2* 3.0*   No results for input(s): LIPASE, AMYLASE in the last 168 hours. No results for input(s): AMMONIA in the last 168 hours. Coagulation Profile: No results for input(s): INR, PROTIME in the last 168 hours. Cardiac Enzymes: No results for input(s): CKTOTAL, CKMB, CKMBINDEX, TROPONINI in the last 168 hours. BNP (last 3 results) No results for input(s): PROBNP in the last 8760 hours. HbA1C: No results for input(s): HGBA1C in the last 72 hours. CBG: Recent Labs  Lab 10/07/18 1933 10/08/18 0029 10/08/18 0819 10/08/18 1151  GLUCAP 383* 238* 277* 275*   Lipid Profile: Recent Labs    10/08/18 0435  CHOL 172  HDL 35*  LDLCALC 97  TRIG 200*  CHOLHDL 4.9   Thyroid Function Tests: Recent Labs    10/08/18 0435  TSH 1.334   Anemia Panel: Recent Labs    10/08/18 0435  VITAMINB12 123*   Sepsis Labs: No results for input(s): PROCALCITON, LATICACIDVEN in the last 168 hours.  Recent Results (from the past 240 hour(s))  SARS Coronavirus 2 [facility] order, Performed in [facility] hospital lab) Nasopharyngeal Nasopharyngeal Swab     Status: None   Collection Time: 10/07/18  8:04 PM   Specimen: Nasopharyngeal Swab  Result Value Ref Range Status   SARS Coronavirus 2 NEGATIVE NEGATIVE Final    Comment: (NOTE) If result is NEGATIVE SARS-CoV-2 target nucleic acids are NOT DETECTED. The SARS-CoV-2 RNA is generally detectable in upper and lower  respiratory specimens during the acute phase of infection. The lowest  concentration of SARS-CoV-2 viral copies this assay can detect is 250  copies / mL. A negative result does not preclude SARS-CoV-2 infection  and should not be used as the sole basis for treatment or other  patient management decisions.  A negative result may occur with  improper specimen collection / handling, submission of specimen other  than nasopharyngeal swab, presence of viral mutation(s) within the  areas targeted by this  assay, and inadequate number of viral copies  (<250 copies / mL). A negative result must be combined with clinical  observations, patient history, and epidemiological information. If result is POSITIVE SARS-CoV-2 target nucleic acids are DETECTED. The SARS-CoV-2 RNA is generally detectable in upper and lower  respiratory specimens dur ing the acute phase of infection.  Positive  results are indicative of active infection with SARS-CoV-2.  Clinical  correlation with patient history and other diagnostic information is  necessary to determine patient infection status.  Positive results do  not rule out bacterial infection or co-infection with other viruses. If result is PRESUMPTIVE POSTIVE SARS-CoV-2 nucleic acids MAY BE PRESENT.   A presumptive positive result was obtained on the submitted specimen  and confirmed on repeat testing.  While 2019 novel coronavirus  (SARS-CoV-2) nucleic acids may be present in the submitted sample  additional confirmatory testing may be necessary for epidemiological  and / or clinical management purposes  to differentiate between  SARS-CoV-2 and other Sarbecovirus currently known to infect humans.  If clinically indicated additional testing with an alternate test  methodology [phone]) is advised. The SARS-CoV-2 RNA is generally  detectable in upper and lower respiratory sp ecimens during the acute  phase of infection. The expected result is Negative. Fact Sheet for Patients:  StrictlyIdeas.no Fact Sheet for Healthcare Providers: BankingDealers.co.za This test is not yet approved or cleared by the [place] FDA and has been authorized for detection and/or diagnosis of SARS-CoV-2 by FDA under an Emergency Use Authorization (EUA).  This EUA will remain in effect (meaning this test can be used) for the duration of the COVID-19 declaration under Section 564(b)(1) of the Act, 21 U.S.C. section 360bbb-3(b)(1),  unless the authorization is terminated or revoked sooner. Performed at [facility], [address] [place], [place] [address]          Radiology Studies: Ct Head Wo Contrast  Result Date: 10/07/2018 CLINICAL DATA:  63 year old female with unexplained altered mental status. EXAM: CT HEAD WITHOUT CONTRAST TECHNIQUE: Contiguous axial images were obtained from the base of the skull through the vertex without intravenous contrast. COMPARISON:  Report of brain MRI 07/03/2001 (no images available). FINDINGS: Brain: Mild lateral and 3rd ventriculomegaly, also described in 2003. The temporal horns remain diminutive. The 4th ventricle has a more normal size. The cerebral aqueduct appears normally visible. No evidence of transependymal edema. No midline shift, mass effect, evidence of mass lesion, intracranial hemorrhage or evidence of cortically based acute infarction. There is a small area of subcortical white matter hypodensity in the left middle frontal gyrus on series 2, image 19. Elsewhere gray-white matter differentiation is within normal limits throughout the brain. No cortical encephalomalacia. Vascular: Calcified atherosclerosis at the skull base. No suspicious intracranial vascular hyperdensity. Skull: Within normal limits. Sinuses/Orbits: Visualized paranasal sinuses and mastoids are clear. Other: Visualized orbits and scalp soft tissues are within normal limits. IMPRESSION: 1.  No acute intracranial abnormality. 2. Chronic ventriculomegaly, similar to that described in 2003 and likely physiologic for this patient. 3. Mild for age nonspecific white matter changes in the left frontal lobe. Electronically Signed   By: Genevie Ann M.D.   On: 10/07/2018 21:51   Mr Angio Head Wo Contrast  Result Date: 10/08/2018 CLINICAL DATA:  Altered mental status for 2 weeks. EXAM: MRI HEAD WITHOUT CONTRAST MRA HEAD WITHOUT CONTRAST TECHNIQUE: Multiplanar, multiecho pulse sequences of the brain and surrounding  structures were obtained without intravenous contrast. Angiographic images of the head were obtained using MRA technique without contrast. COMPARISON:  Head CT 10/07/2018 FINDINGS: MRI HEAD FINDINGS Brain: There is a small amount of linear trace diffusion signal hyperintensity in the left corona radiata with normal ADC and T2/FLAIR hyperintensity likely reflecting a subacute infarct. Elsewhere, no restricted diffusion is seen to indicate an acute infarct. Scattered small foci of T2 hyperintensity elsewhere in the cerebral white matter are nonspecific but compatible with minimal chronic small vessel ischemic disease, not greater than expected for age. Mild lateral and third ventriculomegaly is again noted and favored to reflect central predominant cerebral atrophy rather than hydrocephalus. No intracranial hemorrhage, mass, midline shift, or extra-axial fluid collection is identified. Vascular: Major intracranial vascular flow voids are preserved. Skull and upper cervical spine: Unremarkable bone marrow signal. Sinuses/Orbits: Unremarkable orbits. Paranasal sinuses and mastoid air cells are clear. Other: None. MRA HEAD FINDINGS The visualized distal vertebral arteries are widely patent to the basilar and codominant. Patent PICAs and SCAs are seen bilaterally. The basilar artery is widely patent. There is a moderately large right posterior communicating artery. Both PCAs are patent without evidence of significant proximal  stenosis. The internal carotid arteries are widely patent from skull base to carotid termini. ACAs and MCAs are patent without evidence of proximal branch occlusion or significant proximal stenosis. No aneurysm is identified. IMPRESSION: 1. 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 2. Mild cerebral atrophy. 3. Negative head MRA. Electronically Signed   By: Logan Bores M.D.   On: 10/08/2018 13:50   Mr Brain Wo Contrast  Result Date: 10/08/2018 CLINICAL DATA:  Altered mental status for 2  weeks. EXAM: MRI HEAD WITHOUT CONTRAST MRA HEAD WITHOUT CONTRAST TECHNIQUE: Multiplanar, multiecho pulse sequences of the brain and surrounding structures were obtained without intravenous contrast. Angiographic images of the head were obtained using MRA technique without contrast. COMPARISON:  Head CT 10/07/2018 FINDINGS: MRI HEAD FINDINGS Brain: There is a small amount of linear trace diffusion signal hyperintensity in the left corona radiata with normal ADC and T2/FLAIR hyperintensity likely reflecting a subacute infarct. Elsewhere, no restricted diffusion is seen to indicate an acute infarct. Scattered small foci of T2 hyperintensity elsewhere in the cerebral white matter are nonspecific but compatible with minimal chronic small vessel ischemic disease, not greater than expected for age. Mild lateral and third ventriculomegaly is again noted and favored to reflect central predominant cerebral atrophy rather than hydrocephalus. No intracranial hemorrhage, mass, midline shift, or extra-axial fluid collection is identified. Vascular: Major intracranial vascular flow voids are preserved. Skull and upper cervical spine: Unremarkable bone marrow signal. Sinuses/Orbits: Unremarkable orbits. Paranasal sinuses and mastoid air cells are clear. Other: None. MRA HEAD FINDINGS The visualized distal vertebral arteries are widely patent to the basilar and codominant. Patent PICAs and SCAs are seen bilaterally. The basilar artery is widely patent. There is a moderately large right posterior communicating artery. Both PCAs are patent without evidence of significant proximal stenosis. The internal carotid arteries are widely patent from skull base to carotid termini. ACAs and MCAs are patent without evidence of proximal branch occlusion or significant proximal stenosis. No aneurysm is identified. IMPRESSION: 1. 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 2. Mild cerebral atrophy. 3. Negative head MRA. Electronically Signed    By: Logan Bores M.D.   On: 10/08/2018 13:50   Dg Chest Port 1 View  Result Date: 10/07/2018 CLINICAL DATA:  Patient with altered mental status. EXAM: PORTABLE CHEST 1 VIEW COMPARISON:  Chest radiograph 11/11/2009 FINDINGS: Cardiac contours upper limits of normal. Aortic atherosclerosis. Diffuse bilateral interstitial pulmonary opacities. No pleural effusion or pneumothorax. IMPRESSION: Cardiomegaly. Interstitial opacities which may represent pulmonary vascular redistribution and mild interstitial edema. Electronically Signed   By: Lovey Newcomer M.D.   On: 10/07/2018 20:13        Scheduled Meds:  aspirin EC  81 mg Oral Daily   atorvastatin  40 mg Oral q1800   clopidogrel  75 mg Oral Daily   enoxaparin (LOVENOX) injection  40 mg Subcutaneous Q24H   insulin aspart  0-5 Units Subcutaneous QHS   insulin aspart  0-9 Units Subcutaneous TID WC   insulin glargine  15 Units Subcutaneous Daily   losartan  25 mg Oral Daily   metFORMIN  1,000 mg Oral BID WC   pantoprazole  40 mg Oral Daily   rOPINIRole  1 mg Oral QHS   Continuous Infusions:  sodium chloride 100 mL/hr at 10/07/18 2012   sodium chloride       LOS: 0 days    Time spent: 30 minutes    Val Schiavo Darleen Crocker, DO [facility] Pager [phone]  If 7PM-7AM, please  contact night-coverage [email or web address] Password [facility] 10/08/2018, 2:48 PM

## 2018-10-08 NOTE — Progress Notes (Signed)
*  PRELIMINARY RESULTS* Echocardiogram 2D Echocardiogram has been performed.  Samuel Germany 10/08/2018, 10:39 AM

## 2018-10-08 NOTE — Evaluation (Signed)
Physical Therapy Evaluation Patient Details Name: Tracy Farrell MRN: [id number] DOB: [birth year] Today's Date: 10/08/2018   History of Present Illness  Tracy Farrell  is a 63 y.o. female, w Dm2, RLS, apparently presents with c/o confusion, intermittently over the past 1 month, particularly when her sugar is high.  Family member states has had confusion since this morning.  Pt is currently axoxo3 (person, place, year).  Pt has slight expressive aphasia.  Word finding difficulty.  Pt denies vision change, headache, numbness, tingling, weakness, cp, palp, sob,    Clinical Impression  Patient functioning near baseline for functional mobility and gait, slightly labored cadence without loss of balance, occasional use of side rails in hallway once fatigued and tolerated sitting up in chair after therapy with her granddaughter present at bedside.  Patient will benefit from continued physical therapy in hospital and recommended venue below to increase strength, balance, endurance for safe ADLs and gait.     Follow Up Recommendations No PT follow up;Supervision - Intermittent    Equipment Recommendations  None recommended by PT    Recommendations for Other Services       Precautions / Restrictions Precautions Precautions: Fall Restrictions Weight Bearing Restrictions: No      Mobility  Bed Mobility Overal bed mobility: Modified Independent                Transfers Overall transfer level: Modified independent               General transfer comment: increased time, slightly labored  Ambulation/Gait Ambulation/Gait assistance: Supervision Gait Distance (Feet): 100 Feet Assistive device: None Gait Pattern/deviations: Decreased step length - right;Decreased step length - left;Decreased stride length Gait velocity: decreased   General Gait Details: slightly labored cadence without loss of balance, occasional use of side rails in hallway once fatigued  Stairs             Wheelchair Mobility    Modified Rankin (Stroke Patients Only)       Balance Overall balance assessment: Mild deficits observed, not formally tested                                           Pertinent Vitals/Pain Pain Assessment: Faces Faces Pain Scale: Hurts a little bit Pain Location: frontal headache Pain Descriptors / Indicators: Headache Pain Intervention(s): Limited activity within patient's tolerance;Monitored during session    Home Living Family/patient expects to be discharged to:: Private residence Living Arrangements: Spouse/significant other Available Help at Discharge: Family;Available 24 hours/day Type of Home: House Home Access: Stairs to enter Entrance Stairs-Rails: Right;Left(to wide to reach both) Entrance Stairs-Number of Steps: 3-4 Home Layout: One level Home Equipment: Environmental consultant - 4 wheels      Prior Function Level of Independence: Independent with assistive device(s)         Comments: household and short distanced community ambulator with Rollator PRN, occasionally hand held assist when grocery shopping with spouse     Hand Dominance        Extremity/Trunk Assessment   Upper Extremity Assessment Upper Extremity Assessment: Overall WFL for tasks assessed    Lower Extremity Assessment Lower Extremity Assessment: Generalized weakness    Cervical / Trunk Assessment Cervical / Trunk Assessment: Normal  Communication   Communication: No difficulties  Cognition Arousal/Alertness: Awake/alert Behavior During Therapy: WFL for tasks assessed/performed Overall Cognitive Status: Impaired/Different from baseline Area of Impairment: Memory  General Comments: not 100% yet per patient's granddaughter seated at bedside      General Comments      Exercises     Assessment/Plan    PT Assessment Patient needs continued PT services  PT Problem List Decreased strength;Decreased  activity tolerance;Decreased balance;Decreased mobility       PT Treatment Interventions Balance training;Gait training;Stair training;Functional mobility training;Therapeutic activities;Therapeutic exercise;Patient/family education    PT Goals (Current goals can be found in the Care Plan section)  Acute Rehab PT Goals Patient Stated Goal: return home with family to assist PT Goal Formulation: With patient/family Time For Goal Achievement: 10/10/18 Potential to Achieve Goals: Good    Frequency Min 2X/week   Barriers to discharge        Co-evaluation               AM-PAC PT "6 Clicks" Mobility  Outcome Measure Help needed turning from your back to your side while in a flat bed without using bedrails?: None Help needed moving from lying on your back to sitting on the side of a flat bed without using bedrails?: None Help needed moving to and from a bed to a chair (including a wheelchair)?: None Help needed standing up from a chair using your arms (e.g., wheelchair or bedside chair)?: None Help needed to walk in hospital room?: A Little Help needed climbing 3-5 steps with a railing? : A Little 6 Click Score: 22    End of Session   Activity Tolerance: Patient tolerated treatment well;Patient limited by fatigue Patient left: in chair;with call bell/phone within reach;with family/visitor present Nurse Communication: Mobility status PT Visit Diagnosis: Unsteadiness on feet (R26.81);Other abnormalities of gait and mobility (R26.89);Muscle weakness (generalized) (M62.81)    Time: [id number] PT Time Calculation (min) (ACUTE ONLY): 27 min   Charges:   PT Evaluation $PT Eval Moderate Complexity: 1 Mod PT Treatments $Therapeutic Activity: 23-37 mins        12:25 PM, 10/08/18 Lonell Grandchild, MPT Physical Therapist with [facility] [phone] office [phone] mobile phone

## 2018-10-08 NOTE — Care Management (Signed)
AMS. From home with husband. Seen by PT , no recommendations.  Patient not listed as having a PCP or insurance.  She reports she does follow with Octavio Graves( added to demographics), pays OOP for medications at this time, no issues obtaining medications.  Fills prescriptions at [facility] in [place].  Plans to sign up for Medicare next year on her 64th birthday.

## 2018-10-08 NOTE — Plan of Care (Signed)
  Problem: Acute Rehab PT Goals(only PT should resolve) Goal: Pt Will Go Supine/Side To Sit Outcome: Progressing Flowsheets (Taken 10/08/2018 1226) Pt will go Supine/Side to Sit: Independently Goal: Patient Will Transfer Sit To/From Stand Outcome: Progressing Flowsheets (Taken 10/08/2018 1226) Patient will transfer sit to/from stand: Independently Goal: Pt Will Transfer Bed To Chair/Chair To Bed Outcome: Progressing Flowsheets (Taken 10/08/2018 1226) Pt will Transfer Bed to Chair/Chair to Bed: Independently Goal: Pt Will Ambulate Outcome: Progressing Flowsheets (Taken 10/08/2018 1226) Pt will Ambulate:  > 125 feet  with modified independence   12:27 PM, 10/08/18 Lonell Grandchild, MPT Physical Therapist with [facility] [phone] office [phone] mobile phone

## 2018-10-08 NOTE — Progress Notes (Signed)
EEG Completed; Results Pending  

## 2018-10-09 ENCOUNTER — Other Ambulatory Visit: Payer: Self-pay

## 2018-10-09 LAB — RPR: RPR Ser Ql: NONREACTIVE

## 2018-10-09 LAB — GLUCOSE, CAPILLARY
Glucose-Capillary: 244 mg/dL — ABNORMAL HIGH (ref 70–99)
Glucose-Capillary: 391 mg/dL — ABNORMAL HIGH (ref 70–99)
Glucose-Capillary: 230 mg/dL — ABNORMAL HIGH (ref 70–99)

## 2018-10-09 LAB — HEMOGLOBIN A1C
Hgb A1c MFr Bld: 14.8 % — ABNORMAL HIGH (ref 4.8–5.6)
Mean Plasma Glucose: 378 mg/dL

## 2018-10-09 LAB — HIV ANTIBODY (ROUTINE TESTING W REFLEX): HIV Screen 4th Generation wRfx: NONREACTIVE

## 2018-10-09 MED ORDER — ATORVASTATIN CALCIUM 40 MG PO TABS: 40.0000 mg | ORAL_TABLET | Freq: Every day | ORAL | 3 refills | Status: DC

## 2018-10-09 MED ORDER — LEVETIRACETAM 500 MG PO TABS: 500.0000 mg | ORAL_TABLET | Freq: Two times a day (BID) | ORAL | 2 refills | Status: DC

## 2018-10-09 MED ORDER — INFLUENZA VAC SPLIT QUAD 0.5 ML IM SUSY: 0.5000 mL | PREFILLED_SYRINGE | INTRAMUSCULAR | Status: DC

## 2018-10-09 MED ORDER — CLOPIDOGREL BISULFATE 75 MG PO TABS: 75.0000 mg | ORAL_TABLET | Freq: Every day | ORAL | 0 refills | Status: AC

## 2018-10-09 MED ORDER — ASPIRIN 81 MG PO TBEC: 81.0000 mg | DELAYED_RELEASE_TABLET | Freq: Every day | ORAL | 3 refills | Status: AC

## 2018-10-09 MED ORDER — NOVOLIN 70/30 (70-30) 100 UNIT/ML ~~LOC~~ SUSP: 18.0000 [IU] | Freq: Two times a day (BID) | SUBCUTANEOUS | 3 refills | Status: DC

## 2018-10-09 MED ORDER — LOSARTAN POTASSIUM 25 MG PO TABS: 25.0000 mg | ORAL_TABLET | Freq: Every day | ORAL | 0 refills | Status: DC

## 2018-10-09 NOTE — Progress Notes (Signed)
Physical Therapy Treatment Patient Details Name: Tracy Farrell MRN: [id number] DOB: January 15, 1956 Today's Date: 10/09/2018    History of Present Illness Tracy Farrell  is a 63 y.o. female, w Dm2, RLS, apparently presents with c/o confusion, intermittently over the past 1 month, particularly when her sugar is high.  Family member states has had confusion since this morning.  Pt is currently axoxo3 (person, place, year).  Pt has slight expressive aphasia.  Word finding difficulty.  Pt denies vision change, headache, numbness, tingling, weakness, cp, palp, sob,    PT Comments    Patient demonstrates increased endurance/distance for ambulation with slightly labored cadence and no loss of balance on level, inclined/declined surfaces and on steps in stair well using 1 side rail.  Patient required occasional rest breaks while completing BLE ROM/strengthening exercises while seated in chair due to fatiguing of right leg and overall demonstrates good return for completing exercises.  Patient tolerated staying up in chair after therapy.  Patient will benefit from continued physical therapy in hospital and recommended venue below to increase strength, balance, endurance for safe ADLs and gait.    Follow Up Recommendations  No PT follow up;Supervision - Intermittent     Equipment Recommendations  None recommended by PT    Recommendations for Other Services       Precautions / Restrictions Precautions Precautions: Fall Restrictions Weight Bearing Restrictions: No    Mobility  Bed Mobility               General bed mobility comments: Patient presents seated in chair  Transfers Overall transfer level: Modified independent               General transfer comment: slightly labored without AD  Ambulation/Gait Ambulation/Gait assistance: Modified independent (Device/Increase time);Supervision Gait Distance (Feet): 200 Feet Assistive device: None Gait Pattern/deviations: Decreased step  length - right;Decreased step length - left;Decreased stride length Gait velocity: slightly decreased   General Gait Details: demonstrates increased endurance/distance for ambulation with slightly unsteady cadence without loss of balance or use of side rails on level, inclined and declined surfaces   Stairs Stairs: Yes Stairs assistance: Supervision;Modified independent (Device/Increase time) Stair Management: One rail Left;Alternating pattern Number of Stairs: 3 General stair comments: demonstrates good return for going up/down steps using 1 siderail without loss of balance   Wheelchair Mobility    Modified Rankin (Stroke Patients Only)       Balance Overall balance assessment: Mild deficits observed, not formally tested                                          Cognition Arousal/Alertness: Awake/alert Behavior During Therapy: WFL for tasks assessed/performed Overall Cognitive Status: Within Functional Limits for tasks assessed                                 General Comments: Patient states she feels back to normal      Exercises General Exercises - Lower Extremity Long Arc Quad: Seated;AROM;Strengthening;Both;10 reps Hip Flexion/Marching: Seated;AROM;Strengthening;Both;10 reps Toe Raises: Seated;AROM;Strengthening;Both;15 reps Heel Raises: Seated;AROM;Strengthening;Both;15 reps    General Comments        Pertinent Vitals/Pain Pain Assessment: No/denies pain    Home Living                      Prior Function  PT Goals (current goals can now be found in the care plan section) Acute Rehab PT Goals Patient Stated Goal: return home with family to assist PT Goal Formulation: With patient/family Time For Goal Achievement: 10/10/18 Potential to Achieve Goals: Good Progress towards PT goals: Progressing toward goals    Frequency    Min 2X/week      PT Plan Current plan remains appropriate     Co-evaluation              AM-PAC PT "6 Clicks" Mobility   Outcome Measure  Help needed turning from your back to your side while in a flat bed without using bedrails?: None Help needed moving from lying on your back to sitting on the side of a flat bed without using bedrails?: None Help needed moving to and from a bed to a chair (including a wheelchair)?: None Help needed standing up from a chair using your arms (e.g., wheelchair or bedside chair)?: None Help needed to walk in hospital room?: A Little Help needed climbing 3-5 steps with a railing? : A Little 6 Click Score: 22    End of Session   Activity Tolerance: Patient tolerated treatment well;Patient limited by fatigue Patient left: in chair;with call bell/phone within reach;with family/visitor present Nurse Communication: Mobility status PT Visit Diagnosis: Unsteadiness on feet (R26.81);Other abnormalities of gait and mobility (R26.89);Muscle weakness (generalized) (M62.81)     Time: [id number] PT Time Calculation (min) (ACUTE ONLY): 25 min  Charges:  $Gait Training: 8-22 mins $Therapeutic Exercise: 8-22 mins                     10:22 AM, 10/09/18 Lonell Grandchild, MPT Physical Therapist with [facility] [phone] office [phone] mobile phone

## 2018-10-09 NOTE — TOC Transition Note (Signed)
Transition of Care [facility]) - CM/SW Discharge Note   Patient Details  Name: Tracy Farrell MRN: [id number] Date of Birth: 25-Feb-1955  Transition of Care [facility]) CM/SW Contact:  Terran Hollenkamp, Chauncey Reading, RN Phone Number: 10/09/2018, 10:27 AM   Clinical Narrative:   Patient will be prescribed Novolin 70/30 for better affordability. Also will be started on Keppra. MATCH provided and discussed with patient. Good RX cared given and explained. New appointment made with [facility] to establish care. APPT scheduled and added to AVS, new patient packet given to patient to fill out and take to office prior to appointment.        Discharge Plan and Services   Discharge Planning Services: CM Consult, Follow-up appt scheduled, Medication Assistance, [facility]

## 2018-10-09 NOTE — Progress Notes (Addendum)
Inpatient Diabetes Program Recommendations  AACE/ADA: New Consensus Statement on Inpatient Glycemic Control   Target Ranges:  Prepandial:   less than 140 mg/dL      Peak postprandial:   less than 180 mg/dL (1-2 hours)      Critically ill patients:  140 - 180 mg/dL   Results for Tracy Farrell, Tracy Farrell (MRN [id number]) as of 10/09/2018 09:08  Ref. Range 10/08/2018 08:19 10/08/2018 11:51 10/08/2018 16:47 10/08/2018 21:43 10/09/2018 07:46  Glucose-Capillary Latest Ref Range: 70 - 99 mg/dL 277 (H) 275 (H) 311 (H) 273 (H) 244 (H)  Results for Tracy Farrell, Tracy Farrell (MRN [id number]) as of 10/09/2018 09:08  Ref. Range 10/08/2018 04:35  Hemoglobin A1C Latest Ref Range: 4.8 - 5.6 % 14.8 (H)   Review of Glycemic Control  Diabetes history: DM2 Outpatient Diabetes medications: Tresiba 10 units daily, Metformin 1000 mg BID Current orders for Inpatient glycemic control: Lantus 15 units daily, Metformin 1000 mg BID, Novolog 0-9 units TID with meals, Novolog 0-5 units QHS   Inpatient Diabetes Program Recommendations:  Insulin-Basal: Please consider increasing Lantus to 25 units daily.  Insulin-Meal Coverage: Please consider ordering Novolog 3 units TID with meals for meal coverage if patient eats at least 50% of meals.  HbgA1C: A1C 14.8% on 10/08/18 indicating an average glucose of 378 mg/dl over the past 2-3 months. Patient does NOT have any insurance and reported to CM that she was paying out of pocket for medications. Question if patient is actually getting and/or taking Tracy Farrell since it is over $520 on [facility].  Will likely need a more affordable insulin such as Novolin 70/30 (if needing basal and meal coverage) which would be $25 per vial or $43 per box of 5 insulin pens at [facility].  Addendum 10/09/18@10 :40: Spoke with patient and her granddaughter again over the phone. Patient's speech is improved today and she is speaking more fluently and not having as much difficulty with word finding.  Patient admits that she can not  afford to get Tracy Farrell and [place] and she has not been able to take it consistently in several months. Patient reports that she would be interested in more affordable insulin options. Discussed Novolin 70/30 insulin in more detail and how it is typically taken with breakfast and supper. Informed patient that Novolin 70/30 could be purchased at [facility] for $25 per vial or $43 for box of 5 insulin pens. Patient states that even paying that much may be difficult right now. Informed patient that it would be requested that CM see if patient can get medication assistance. Patient reports that she has not seen Dr. [name] since April and she has tried to make follow up appointment with Dr. [name] but she thinks Dr. [name] has retired and she may need to find a new doctor that she can afford. Discussed local clinics and informed patient that I would ask CM to provide list of local clinics or to see if they could make her an appointment for follow up.  Discussed A1C of 14.8% on 10/08/18. Discussed how hyperglycemia damages inner lining of blood vessels and leads to complications of uncontrolled DM. Stressed to patient that she needs to get DM under better control to decrease risk of further complications. Patient states that she knows she has got to do better but it has been difficult due to cost of medications and follow up.  Patient asked that I speak to her granddaughter (currently at bedside) and let her know about the Novolin 70/30 insulin as well. Spoke with [name] (granddaughter)  and discussed the same information regarding Novolin 70/30 and local clinics. Stressed importance of DM control, taking medications, and following up with a provider. Patient and her granddaughter verbalized understanding of information and state they have no questions at this time. Sent secure chat to S. Hunnicutt, RN CM regarding medication and follow up needs.   Thanks, Barnie Alderman, RN, MSN, CDE Diabetes Coordinator Inpatient Diabetes  Program [phone] (Team Pager from 8am to 5pm)

## 2018-10-09 NOTE — Discharge Summary (Signed)
Physician Discharge Summary  Tracy Farrell [id number] DOB: [birth year] DOA: 10/07/2018  PCP: Scotty Court, DO  Admit date: 10/07/2018  Discharge date: 10/09/2018  Admitted From:Home  Disposition:  Home  Recommendations for Outpatient Follow-up:  1. Follow up with PCP in 1-2 weeks 2. Follow-up with Dr. Merlene Laughter in 4 weeks 3. Continue on Keppra 500 mg twice daily as prescribed 4. Continue on aspirin and Plavix for next 2 weeks and then remain on aspirin 81 mg thereafter 5. Remain on statin as prescribed 6. Continue home metformin and switch to Novolin 70/30 18 units twice daily as this is a more affordable medication.  Questionable if patient was actually taking [name] at home as she pays out-of-pocket.  CSW to work on helping patient afford medications with match program.  Home Health: None  Equipment/Devices: None  Discharge Condition: Stable  CODE STATUS: Full  Diet recommendation: Heart Healthy/carb modified  Brief/Interim Summary: Per HPI: [name] a63 y.o.female,w Dm2, RLS, apparently presents with c/o confusion, intermittently over the past 1 month, particularly when her sugar is high. Family member states has had confusion since this morning. Pt is currently axoxo3 (person, place, year). Pt has slight expressive aphasia. Word finding difficulty. Pt denies vision change, headache, numbness, tingling, weakness, cp, palp, sob,   9/14: Patient continues to have periods of confusion as well as neurological sequelae of numbness and some weakness.  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  9/15: Patient seen by neurology on 9/14 with recommendations to continue dual antiplatelet treatment with aspirin and Plavix for the next 2 weeks.  She is to remain on statin as prescribed as well.  EEG was concerning for possible recurrent complex partial seizures for which Keppra was started and will now be transitioned to oral 500 mg twice daily.  She  appears to be responding well to this current treatment and has no further issues with her speech.  She has had 2D echocardiogram with LVEF 55% with concentric LVH noted.  Bilateral carotid ultrasounds with minimal plaque noted.  She has been evaluated by physical therapy with no home health needs currently noted.  Unfortunately, her hemoglobin A1c was noted to be 14.8% and it appears that may be she not have adequate compliance with her outpatient regimen of Tresiba.  She does pay out-of-pocket and therefore, her insulin regimen was changed to Novolin 70/30 18 units twice daily as recommended by diabetes coordinator since this would be more affordable.  She is otherwise stable for discharge with no other acute concerns noted throughout the course of this admission.  Discharge Diagnoses:  Principal Problem:   AMS (altered mental status) Active Problems:   Diabetes ([place])   Hypertension   Moderate protein-calorie malnutrition ([place])   Anemia   CVA (cerebral vascular accident) ([place])  Principal discharge diagnosis: Acute intermittent encephalopathy likely secondary to recurrent complex partial seizures in the setting of subacute left corona radiata CVA.  Discharge Instructions  Discharge Instructions    Diet - low sodium heart healthy   Complete by: As directed    Increase activity slowly   Complete by: As directed      Allergies as of 10/09/2018      Reactions   Sulfa Antibiotics Other (See Comments)   UNKNOWN REACTION      Medication List    STOP taking these medications   Tresiba FlexTouch 100 UNIT/ML Sopn FlexTouch Pen Generic drug: insulin degludec     TAKE these medications   aspirin 81 MG  EC tablet Take 1 tablet (81 mg total) by mouth daily. Start taking on: October 10, 2018   atorvastatin 40 MG tablet Commonly known as: LIPITOR Take 1 tablet (40 mg total) by mouth daily at 6 PM.   clopidogrel 75 MG tablet Commonly known as: PLAVIX Take 1 tablet (75 mg total) by  mouth daily for 14 days. Start taking on: October 10, 2018   levETIRAcetam 500 MG tablet Commonly known as: Keppra Take 1 tablet (500 mg total) by mouth 2 (two) times daily.   losartan 25 MG tablet Commonly known as: COZAAR Take 1 tablet (25 mg total) by mouth daily. Start taking on: October 10, 2018   metFORMIN 1000 MG tablet Commonly known as: GLUCOPHAGE Take 1,000 mg by mouth 2 (two) times daily with a meal.   NovoLIN 70/30 (70-30) 100 UNIT/ML injection Generic drug: insulin NPH-regular Human Inject 18 Units into the skin 2 (two) times daily with a meal.   omeprazole 20 MG capsule Commonly known as: PRILOSEC Take 20 mg by mouth daily.   rOPINIRole 1 MG tablet Commonly known as: REQUIP Take 1 mg by mouth at bedtime.      Follow-up Information    Octavio Graves P, DO Follow up in 1 week(s).   Contact information: [address] [place] [address] [id number]        Phillips Odor, MD Follow up in 4 week(s).   Specialty: Neurology Contact information: [address] [place] [address] [phone]          Allergies  Allergen Reactions  . Sulfa Antibiotics Other (See Comments)    UNKNOWN REACTION    Consultations:  Neurology   Procedures/Studies: Ct Head Wo Contrast  Result Date: 10/07/2018 CLINICAL DATA:  63 year old female with unexplained altered mental status. EXAM: CT HEAD WITHOUT CONTRAST TECHNIQUE: Contiguous axial images were obtained from the base of the skull through the vertex without intravenous contrast. COMPARISON:  Report of brain MRI 07/03/2001 (no images available). FINDINGS: Brain: Mild lateral and 3rd ventriculomegaly, also described in 2003. The temporal horns remain diminutive. The 4th ventricle has a more normal size. The cerebral aqueduct appears normally visible. No evidence of transependymal edema. No midline shift, mass effect, evidence of mass lesion, intracranial hemorrhage or evidence of cortically based  acute infarction. There is a small area of subcortical white matter hypodensity in the left middle frontal gyrus on series 2, image 19. Elsewhere gray-white matter differentiation is within normal limits throughout the brain. No cortical encephalomalacia. Vascular: Calcified atherosclerosis at the skull base. No suspicious intracranial vascular hyperdensity. Skull: Within normal limits. Sinuses/Orbits: Visualized paranasal sinuses and mastoids are clear. Other: Visualized orbits and scalp soft tissues are within normal limits. IMPRESSION: 1.  No acute intracranial abnormality. 2. Chronic ventriculomegaly, similar to that described in 2003 and likely physiologic for this patient. 3. Mild for age nonspecific white matter changes in the left frontal lobe. Electronically Signed   By: Genevie Ann M.D.   On: 10/07/2018 21:51   Mr Angio Head Wo Contrast  Result Date: 10/08/2018 CLINICAL DATA:  Altered mental status for 2 weeks. EXAM: MRI HEAD WITHOUT CONTRAST MRA HEAD WITHOUT CONTRAST TECHNIQUE: Multiplanar, multiecho pulse sequences of the brain and surrounding structures were obtained without intravenous contrast. Angiographic images of the head were obtained using MRA technique without contrast. COMPARISON:  Head CT 10/07/2018 FINDINGS: MRI HEAD FINDINGS Brain: There is a small amount of linear trace diffusion signal hyperintensity in the left corona radiata with  normal ADC and T2/FLAIR hyperintensity likely reflecting a subacute infarct. Elsewhere, no restricted diffusion is seen to indicate an acute infarct. Scattered small foci of T2 hyperintensity elsewhere in the cerebral white matter are nonspecific but compatible with minimal chronic small vessel ischemic disease, not greater than expected for age. Mild lateral and third ventriculomegaly is again noted and favored to reflect central predominant cerebral atrophy rather than hydrocephalus. No intracranial hemorrhage, mass, midline shift, or extra-axial fluid  collection is identified. Vascular: Major intracranial vascular flow voids are preserved. Skull and upper cervical spine: Unremarkable bone marrow signal. Sinuses/Orbits: Unremarkable orbits. Paranasal sinuses and mastoid air cells are clear. Other: None. MRA HEAD FINDINGS The visualized distal vertebral arteries are widely patent to the basilar and codominant. Patent PICAs and SCAs are seen bilaterally. The basilar artery is widely patent. There is a moderately large right posterior communicating artery. Both PCAs are patent without evidence of significant proximal stenosis. The internal carotid arteries are widely patent from skull base to carotid termini. ACAs and MCAs are patent without evidence of proximal branch occlusion or significant proximal stenosis. No aneurysm is identified. IMPRESSION: 1. 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 2. Mild cerebral atrophy. 3. Negative head MRA. Electronically Signed   By: Logan Bores M.D.   On: 10/08/2018 13:50   Mr Brain Wo Contrast  Result Date: 10/08/2018 CLINICAL DATA:  Altered mental status for 2 weeks. EXAM: MRI HEAD WITHOUT CONTRAST MRA HEAD WITHOUT CONTRAST TECHNIQUE: Multiplanar, multiecho pulse sequences of the brain and surrounding structures were obtained without intravenous contrast. Angiographic images of the head were obtained using MRA technique without contrast. COMPARISON:  Head CT 10/07/2018 FINDINGS: MRI HEAD FINDINGS Brain: There is a small amount of linear trace diffusion signal hyperintensity in the left corona radiata with normal ADC and T2/FLAIR hyperintensity likely reflecting a subacute infarct. Elsewhere, no restricted diffusion is seen to indicate an acute infarct. Scattered small foci of T2 hyperintensity elsewhere in the cerebral white matter are nonspecific but compatible with minimal chronic small vessel ischemic disease, not greater than expected for age. Mild lateral and third ventriculomegaly is again noted and favored  to reflect central predominant cerebral atrophy rather than hydrocephalus. No intracranial hemorrhage, mass, midline shift, or extra-axial fluid collection is identified. Vascular: Major intracranial vascular flow voids are preserved. Skull and upper cervical spine: Unremarkable bone marrow signal. Sinuses/Orbits: Unremarkable orbits. Paranasal sinuses and mastoid air cells are clear. Other: None. MRA HEAD FINDINGS The visualized distal vertebral arteries are widely patent to the basilar and codominant. Patent PICAs and SCAs are seen bilaterally. The basilar artery is widely patent. There is a moderately large right posterior communicating artery. Both PCAs are patent without evidence of significant proximal stenosis. The internal carotid arteries are widely patent from skull base to carotid termini. ACAs and MCAs are patent without evidence of proximal branch occlusion or significant proximal stenosis. No aneurysm is identified. IMPRESSION: 1. 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 2. Mild cerebral atrophy. 3. Negative head MRA. Electronically Signed   By: Logan Bores M.D.   On: 10/08/2018 13:50   US Carotid Bilateral  Result Date: 10/08/2018 CLINICAL DATA:  63 year old female with a history TIA EXAM: BILATERAL CAROTID DUPLEX ULTRASOUND TECHNIQUE: Pearline Cables scale imaging, color Doppler and duplex ultrasound were performed of bilateral carotid and vertebral arteries in the neck. COMPARISON:  No prior duplex FINDINGS: Criteria: Quantification of carotid stenosis is based on velocity parameters that correlate the residual internal carotid diameter with NASCET-based stenosis  levels, using the diameter of the distal internal carotid lumen as the denominator for stenosis measurement. The following velocity measurements were obtained: RIGHT ICA:  Systolic A999333 cm/sec, Diastolic 30 cm/sec CCA:  84 cm/sec SYSTOLIC ICA/CCA RATIO:  0000000 ECA:  95 cm/sec LEFT ICA:  Systolic 90 cm/sec, Diastolic 28 cm/sec CCA:  94  cm/sec SYSTOLIC ICA/CCA RATIO:  0.9 ECA:  95 cm/sec Right Brachial SBP: Not acquired Left Brachial SBP: Not acquired RIGHT CAROTID ARTERY: No significant calcified disease of the right common carotid artery. Intermediate waveform maintained. Heterogeneous plaque without significant calcifications at the right carotid bifurcation. Low resistance waveform of the right ICA. No significant tortuosity. RIGHT VERTEBRAL ARTERY: Antegrade flow with low resistance waveform. LEFT CAROTID ARTERY: No significant calcified disease of the left common carotid artery. Intermediate waveform maintained. Heterogeneous plaque at the left carotid bifurcation without significant calcifications. Low resistance waveform of the left ICA. LEFT VERTEBRAL ARTERY:  Antegrade flow with low resistance waveform. IMPRESSION: Color duplex indicates minimal heterogeneous plaque, with no hemodynamically significant stenosis by duplex criteria in the extracranial cerebrovascular circulation. Signed, Dulcy Fanny. Dellia Nims, RPVI [facility] Electronically Signed   By: Corrie Mckusick D.O.   On: 10/08/2018 16:38   Dg Chest Port 1 View  Result Date: 10/07/2018 CLINICAL DATA:  Patient with altered mental status. EXAM: PORTABLE CHEST 1 VIEW COMPARISON:  Chest radiograph 11/11/2009 FINDINGS: Cardiac contours upper limits of normal. Aortic atherosclerosis. Diffuse bilateral interstitial pulmonary opacities. No pleural effusion or pneumothorax. IMPRESSION: Cardiomegaly. Interstitial opacities which may represent pulmonary vascular redistribution and mild interstitial edema. Electronically Signed   By: Lovey Newcomer M.D.   On: 10/07/2018 20:13     Discharge Exam: Vitals:   10/09/18 0530 10/09/18 0930  BP: 137/77 (!) 142/73  Pulse:  91  Resp: 17 18  Temp: 97.9 F (36.6 C) 98.1 F (36.7 C)  SpO2: 98% 98%   Vitals:   10/09/18 0144 10/09/18 0400 10/09/18 0530 10/09/18 0930  BP: 133/68  137/77  (!) 142/73  Pulse: 93   91  Resp: 18  17 18   Temp: 98.6 F (37 C)  97.9 F (36.6 C) 98.1 F (36.7 C)  TempSrc: Oral  Oral Oral  SpO2: 94%  98% 98%  Weight:  92.6 kg    Height:        General: Pt is alert, awake, not in acute distress Cardiovascular: RRR, S1/S2 +, no rubs, no gallops Respiratory: CTA bilaterally, no wheezing, no rhonchi Abdominal: Soft, NT, ND, bowel sounds + Extremities: no edema, no cyanosis    The results of significant diagnostics from this hospitalization (including imaging, microbiology, ancillary and laboratory) are listed below for reference.     Microbiology: Recent Results (from the past 240 hour(s))  SARS Coronavirus 2 [facility] order, Performed in [facility] hospital lab) Nasopharyngeal Nasopharyngeal Swab     Status: None   Collection Time: 10/07/18  8:04 PM   Specimen: Nasopharyngeal Swab  Result Value Ref Range Status   SARS Coronavirus 2 NEGATIVE NEGATIVE Final    Comment: (NOTE) If result is NEGATIVE SARS-CoV-2 target nucleic acids are NOT DETECTED. The SARS-CoV-2 RNA is generally detectable in upper and lower  respiratory specimens during the acute phase of infection. The lowest  concentration of SARS-CoV-2 viral copies this assay can detect is 250  copies / mL. A negative result does not preclude SARS-CoV-2 infection  and should not be used as the sole basis for treatment or other  patient management  decisions.  A negative result may occur with  improper specimen collection / handling, submission of specimen other  than nasopharyngeal swab, presence of viral mutation(s) within the  areas targeted by this assay, and inadequate number of viral copies  (<250 copies / mL). A negative result must be combined with clinical  observations, patient history, and epidemiological information. If result is POSITIVE SARS-CoV-2 target nucleic acids are DETECTED. The SARS-CoV-2 RNA is generally detectable in upper and lower  respiratory specimens  dur ing the acute phase of infection.  Positive  results are indicative of active infection with SARS-CoV-2.  Clinical  correlation with patient history and other diagnostic information is  necessary to determine patient infection status.  Positive results do  not rule out bacterial infection or co-infection with other viruses. If result is PRESUMPTIVE POSTIVE SARS-CoV-2 nucleic acids MAY BE PRESENT.   A presumptive positive result was obtained on the submitted specimen  and confirmed on repeat testing.  While 2019 novel coronavirus  (SARS-CoV-2) nucleic acids may be present in the submitted sample  additional confirmatory testing may be necessary for epidemiological  and / or clinical management purposes  to differentiate between  SARS-CoV-2 and other Sarbecovirus currently known to infect humans.  If clinically indicated additional testing with an alternate test  methodology [phone]) is advised. The SARS-CoV-2 RNA is generally  detectable in upper and lower respiratory sp ecimens during the acute  phase of infection. The expected result is Negative. Fact Sheet for Patients:  StrictlyIdeas.no Fact Sheet for Healthcare Providers: BankingDealers.co.za This test is not yet approved or cleared by the [place] FDA and has been authorized for detection and/or diagnosis of SARS-CoV-2 by FDA under an Emergency Use Authorization (EUA).  This EUA will remain in effect (meaning this test can be used) for the duration of the COVID-19 declaration under Section 564(b)(1) of the Act, 21 U.S.C. section 360bbb-3(b)(1), unless the authorization is terminated or revoked sooner. Performed at [facility], [address] [place], [place] [address]      Labs: BNP (last 3 results) No results for input(s): BNP in the last 8760 hours. Basic Metabolic Panel: Recent Labs  Lab 10/07/18 2012 10/08/18 0435  NA 128* 132*  K 4.3 4.2  CL 95* 100  CO2  24 23  GLUCOSE 398* 271*  BUN 16 14  CREATININE 1.03* 0.77  CALCIUM 8.6* 8.5*   Liver Function Tests: Recent Labs  Lab 10/07/18 2012 10/08/18 0435  AST 12* 9*  ALT 14 12  ALKPHOS 80 67  BILITOT 0.6 0.7  PROT 6.2* 5.7*  ALBUMIN 3.2* 3.0*   No results for input(s): LIPASE, AMYLASE in the last 168 hours. No results for input(s): AMMONIA in the last 168 hours. CBC: Recent Labs  Lab 10/07/18 2012 10/08/18 0435  WBC 7.0 7.3  HGB 11.6* 11.2*  HCT 36.8 34.4*  MCV 82.5 81.9  PLT 273 264   Cardiac Enzymes: No results for input(s): CKTOTAL, CKMB, CKMBINDEX, TROPONINI in the last 168 hours. BNP: Invalid input(s): POCBNP CBG: Recent Labs  Lab 10/08/18 0819 10/08/18 1151 10/08/18 1647 10/08/18 2143 10/09/18 0746  GLUCAP 277* 275* 311* 273* 244*   D-Dimer No results for input(s): DDIMER in the last 72 hours. Hgb A1c Recent Labs    10/08/18 0435  HGBA1C 14.8*   Lipid Profile Recent Labs    10/08/18 0435  CHOL 172  HDL 35*  LDLCALC 97  TRIG 200*  CHOLHDL 4.9   Thyroid function studies Recent Labs  10/08/18 0435  TSH 1.334   Anemia work up Recent Labs    10/08/18 0435 10/08/18 1624  VITAMINB12 123* >7,500*  FOLATE  --  9.8  FERRITIN  --  18  TIBC  --  429  IRON  --  35  RETICCTPCT  --  1.3   Urinalysis    Component Value Date/Time   COLORURINE YELLOW 10/08/2018 0023   APPEARANCEUR CLEAR 10/08/2018 0023   LABSPEC 1.031 (H) 10/08/2018 0023   PHURINE 6.0 10/08/2018 0023   GLUCOSEU >=500 (A) 10/08/2018 0023   HGBUR NEGATIVE 10/08/2018 0023   BILIRUBINUR NEGATIVE 10/08/2018 0023   KETONESUR NEGATIVE 10/08/2018 0023   PROTEINUR NEGATIVE 10/08/2018 0023   UROBILINOGEN 0.2 11/11/2009 1915   NITRITE NEGATIVE 10/08/2018 0023   LEUKOCYTESUR NEGATIVE 10/08/2018 0023   Sepsis Labs Invalid input(s): PROCALCITONIN,  WBC,  LACTICIDVEN Microbiology Recent Results (from the past 240 hour(s))  SARS Coronavirus 2 [facility] order, Performed in [place] hospital lab) Nasopharyngeal Nasopharyngeal Swab     Status: None   Collection Time: 10/07/18  8:04 PM   Specimen: Nasopharyngeal Swab  Result Value Ref Range Status   SARS Coronavirus 2 NEGATIVE NEGATIVE Final    Comment: (NOTE) If result is NEGATIVE SARS-CoV-2 target nucleic acids are NOT DETECTED. The SARS-CoV-2 RNA is generally detectable in upper and lower  respiratory specimens during the acute phase of infection. The lowest  concentration of SARS-CoV-2 viral copies this assay can detect is 250  copies / mL. A negative result does not preclude SARS-CoV-2 infection  and should not be used as the sole basis for treatment or other  patient management decisions.  A negative result may occur with  improper specimen collection / handling, submission of specimen other  than nasopharyngeal swab, presence of viral mutation(s) within the  areas targeted by this assay, and inadequate number of viral copies  (<250 copies / mL). A negative result must be combined with clinical  observations, patient history, and epidemiological information. If result is POSITIVE SARS-CoV-2 target nucleic acids are DETECTED. The SARS-CoV-2 RNA is generally detectable in upper and lower  respiratory specimens dur ing the acute phase of infection.  Positive  results are indicative of active infection with SARS-CoV-2.  Clinical  correlation with patient history and other diagnostic information is  necessary to determine patient infection status.  Positive results do  not rule out bacterial infection or co-infection with other viruses. If result is PRESUMPTIVE POSTIVE SARS-CoV-2 nucleic acids MAY BE PRESENT.   A presumptive positive result was obtained on the submitted specimen  and confirmed on repeat testing.  While 2019 novel coronavirus  (SARS-CoV-2) nucleic acids may be present in the submitted sample  additional confirmatory testing may be necessary for epidemiological  and / or clinical management  purposes  to differentiate between  SARS-CoV-2 and other Sarbecovirus currently known to infect humans.  If clinically indicated additional testing with an alternate test  methodology [phone]) is advised. The SARS-CoV-2 RNA is generally  detectable in upper and lower respiratory sp ecimens during the acute  phase of infection. The expected result is Negative. Fact Sheet for Patients:  StrictlyIdeas.no Fact Sheet for Healthcare Providers: BankingDealers.co.za This test is not yet approved or cleared by the [place] FDA and has been authorized for detection and/or diagnosis of SARS-CoV-2 by FDA under an Emergency Use Authorization (EUA).  This EUA will remain in effect (meaning this test can be used) for the duration of the COVID-19 declaration under Section 564(b)(1)  of the Act, 21 U.S.C. section 360bbb-3(b)(1), unless the authorization is terminated or revoked sooner. Performed at [facility], [address], [place], [place] [address]      Time coordinating discharge: 40 minutes  SIGNED:   Rodena Goldmann, DO [facility] 10/09/2018, 10:25 AM  If 7PM-7AM, please contact night-coverage [email or web address] Password [email or web address]

## 2018-10-22 ENCOUNTER — Telehealth: Payer: Self-pay | Admitting: Family Medicine

## 2018-10-22 NOTE — Telephone Encounter (Signed)
Left message to call back  

## 2018-10-23 ENCOUNTER — Ambulatory Visit (INDEPENDENT_AMBULATORY_CARE_PROVIDER_SITE_OTHER): Payer: Self-pay | Admitting: Family Medicine

## 2018-10-23 ENCOUNTER — Encounter: Payer: Self-pay | Admitting: Family Medicine

## 2018-10-23 ENCOUNTER — Other Ambulatory Visit: Payer: Self-pay

## 2018-10-23 VITALS — BP 162/84 | HR 92 | Temp 98.2°F | Resp 20 | Ht 62.0 in | Wt 202.0 lb

## 2018-10-23 DIAGNOSIS — E119 Type 2 diabetes mellitus without complications: Secondary | ICD-10-CM

## 2018-10-23 DIAGNOSIS — I10 Essential (primary) hypertension: Secondary | ICD-10-CM

## 2018-10-23 DIAGNOSIS — G2581 Restless legs syndrome: Secondary | ICD-10-CM

## 2018-10-23 DIAGNOSIS — Z794 Long term (current) use of insulin: Secondary | ICD-10-CM

## 2018-10-23 DIAGNOSIS — I152 Hypertension secondary to endocrine disorders: Secondary | ICD-10-CM

## 2018-10-23 DIAGNOSIS — E1159 Type 2 diabetes mellitus with other circulatory complications: Secondary | ICD-10-CM

## 2018-10-23 DIAGNOSIS — I69320 Aphasia following cerebral infarction: Secondary | ICD-10-CM | POA: Insufficient documentation

## 2018-10-23 DIAGNOSIS — D649 Anemia, unspecified: Secondary | ICD-10-CM

## 2018-10-23 DIAGNOSIS — K219 Gastro-esophageal reflux disease without esophagitis: Secondary | ICD-10-CM | POA: Insufficient documentation

## 2018-10-23 LAB — BAYER DCA HB A1C WAIVED: HB A1C (BAYER DCA - WAIVED): >14 % — ABNORMAL HIGH (ref <7.0)

## 2018-10-23 MED ORDER — ROPINIROLE HCL 1 MG PO TABS: 1.0000 mg | ORAL_TABLET | Freq: Every day | ORAL | 1 refills | Status: DC

## 2018-10-23 MED ORDER — OMEPRAZOLE 20 MG PO CPDR: 20.0000 mg | DELAYED_RELEASE_CAPSULE | Freq: Every day | ORAL | 1 refills | Status: DC

## 2018-10-23 MED ORDER — NOVOLIN 70/30 (70-30) 100 UNIT/ML ~~LOC~~ SUSP: 40.0000 [IU] | Freq: Two times a day (BID) | SUBCUTANEOUS | 3 refills | Status: DC

## 2018-10-23 MED ORDER — LOSARTAN POTASSIUM 25 MG PO TABS: 25.0000 mg | ORAL_TABLET | Freq: Every day | ORAL | 0 refills | Status: DC

## 2018-10-23 NOTE — Telephone Encounter (Signed)
Patient was seen in office for appointment.

## 2018-10-23 NOTE — Progress Notes (Signed)
Subjective:  Patient ID: Tracy Farrell, female    DOB: July 16, 1955, 63 y.o.   MRN: [id number]  Patient Care Team: Baruch Gouty, FNP as PCP - General (Family Medicine)   Chief Complaint:  Establish Care (new [name]), Diabetes, and Hypertension   HPI: Tracy Farrell is a 63 y.o. female presenting on 10/23/2018 for Linganore (new [name]), Diabetes, and Hypertension   Pt presents today to establish care. Pt was previously followed by Dr. [name] who has retired. No records available for review today. Pt did have a recent hospital admission on 10/07/2018 for AMS, hyperglycemia, and expressive aphasia. 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 Pt was discharged home on Keppra, ASA 81 mg daily and Plavix 75 mg daily (14 days). Pt was to follow up with PCP and neurology. She has not followed up with neurology to date. She is aware of the importance of this follow up.  Pt states she continues to have expressive aphasia at times. No confusion or weakness. No visual disturbances. No syncope or dizziness.  Pt reports she has restless leg syndrome that she takes Requip for. States the Requip works well.  She reports well controlled GERD. No cough, dysphagia, hemoptysis, voice change, or early satiety. Compliant with medications.  She reports her blood pressure has been well controlled. States she has not taken her medications this morning. She denies chest pain, palpitations, shortness of breath, or increased leg swelling. No dizziness or syncope. She has uncontrolled T2DM. Her A1C was 14.8 on 10/08/2018. Pt denies polyuria, polyphagia, or polydipsia.  She states she still has slight fatigue. No melena, hematochezia, or dark stools.    Relevant past medical, surgical, family, and social history reviewed and updated as indicated.  Allergies and medications reviewed  and updated. Date reviewed: Chart in Epic.   Past Medical History:  Diagnosis Date  . Arthritis   . Carpal tunnel syndrome, bilateral   . Chronic back pain   . Depression   . Diabetes mellitus   . GERD (gastroesophageal reflux disease)   . Headache(784.0)   . Hyperlipemia   . Hypertension   . RLS (restless legs syndrome)   . Stroke [facility])     Past Surgical History:  Procedure Laterality Date  . ABDOMINAL HYSTERECTOMY    . CARPAL TUNNEL RELEASE     bilateral   . CARPAL TUNNEL RELEASE  08/17/2011   Procedure: CARPAL TUNNEL RELEASE;  Surgeon: Wynonia Sours, MD;  Location: [facility];  Service: Orthopedics;  Laterality: Right;  re-release carpal canal hypothenar fat pad transfer  . KNEE SURGERY     right   . LUMBAR EPIDURAL INJECTION      Social History   Socioeconomic History  . Marital status: Married    Spouse name: Abe People  . Number of children: 2  . Years of education: Not on file  . Highest education level: Not on file  Occupational History  . Not on file  Social Needs  . Financial resource strain: Not on file  . Food insecurity    Worry: Not on file    Inability: Not on file  . Transportation needs    Medical: Not on file    Non-medical: Not on file  Tobacco Use  . Smoking status: Never Smoker  . Smokeless tobacco: Never Used  Substance and Sexual Activity  . Alcohol use:  No  . Drug use: Never  . Sexual activity: Never  Lifestyle  . Physical activity    Days per week: Not on file    Minutes per session: Not on file  . Stress: Not on file  Relationships  . Social Herbalist on phone: Not on file    Gets together: Not on file    Attends religious service: Not on file    Active member of club or organization: Not on file    Attends meetings of clubs or organizations: Not on file    Relationship status: Not on file  . Intimate partner violence    Fear of current or ex partner: Not on file    Emotionally abused: Not on file     Physically abused: Not on file    Forced sexual activity: Not on file  Other Topics Concern  . Not on file  Social History Narrative   Lives with husband , daughter passed away Mar 27, 2018    Outpatient Encounter Medications as of 10/23/2018  Medication Sig  . aspirin EC 81 MG EC tablet Take 1 tablet (81 mg total) by mouth daily.  Marland Kitchen atorvastatin (LIPITOR) 40 MG tablet Take 1 tablet (40 mg total) by mouth daily at 6 PM.  . clopidogrel (PLAVIX) 75 MG tablet Take 1 tablet (75 mg total) by mouth daily for 14 days.  . insulin NPH-regular Human (NOVOLIN 70/30) (70-30) 100 UNIT/ML injection Inject 40 Units into the skin 2 (two) times daily with a meal.  . levETIRAcetam (KEPPRA) 500 MG tablet Take 1 tablet (500 mg total) by mouth 2 (two) times daily.  Marland Kitchen losartan (COZAAR) 25 MG tablet Take 1 tablet (25 mg total) by mouth daily.  . metFORMIN (GLUCOPHAGE) 1000 MG tablet Take 1,000 mg by mouth 2 (two) times daily with a meal.  . omeprazole (PRILOSEC) 20 MG capsule Take 1 capsule (20 mg total) by mouth daily.  Marland Kitchen rOPINIRole (REQUIP) 1 MG tablet Take 1 tablet (1 mg total) by mouth at bedtime.  . [DISCONTINUED] insulin NPH-regular Human (NOVOLIN 70/30) (70-30) 100 UNIT/ML injection Inject 18 Units into the skin 2 (two) times daily with a meal.  . [DISCONTINUED] losartan (COZAAR) 25 MG tablet Take 1 tablet (25 mg total) by mouth daily.  . [DISCONTINUED] omeprazole (PRILOSEC) 20 MG capsule Take 20 mg by mouth daily.  . [DISCONTINUED] rOPINIRole (REQUIP) 1 MG tablet Take 1 mg by mouth at bedtime.   No facility-administered encounter medications on file as of 10/23/2018.     Allergies  Allergen Reactions  . Sulfa Antibiotics Other (See Comments)    UNKNOWN REACTION    Review of Systems  Constitutional: Positive for fatigue. Negative for activity change, appetite change, chills, diaphoresis, fever and unexpected weight change.  HENT: Negative.   Eyes: Negative.  Negative for photophobia and visual disturbance.   Respiratory: Negative for cough, chest tightness and shortness of breath.   Cardiovascular: Positive for leg swelling. Negative for chest pain and palpitations.  Gastrointestinal: Negative for abdominal distention, abdominal pain, anal bleeding, blood in stool, constipation, diarrhea, nausea, rectal pain and vomiting.  Endocrine: Negative.  Negative for cold intolerance, heat intolerance, polydipsia, polyphagia and polyuria.  Genitourinary: Negative for decreased urine volume, difficulty urinating, dysuria, frequency and urgency.  Musculoskeletal: Negative for arthralgias and myalgias.  Skin: Negative.  Negative for color change and pallor.  Allergic/Immunologic: Negative.   Neurological: Positive for speech difficulty (expressive aphasia). Negative for dizziness, tremors, seizures, syncope, facial asymmetry, weakness, light-headedness,  numbness and headaches.  Hematological: Negative.   Psychiatric/Behavioral: Negative for agitation, behavioral problems, confusion, hallucinations, sleep disturbance and suicidal ideas.  All other systems reviewed and are negative.       Objective:  BP (!) 162/84 (BP Location: Left Arm)   Pulse 92   Temp 98.2 F (36.8 C)   Resp 20   Ht _0  (1.575 m)   Wt 202 lb (91.6 kg)   SpO2 97%   BMI 36.95 kg/m    Wt Readings from Last 3 Encounters:  10/23/18 202 lb (91.6 kg)  10/09/18 204 lb 2.3 oz (92.6 kg)  08/17/11 206 lb (93.4 kg)    Physical Exam Vitals signs and nursing note reviewed.  Constitutional:      General: She is not in acute distress.    Appearance: Normal appearance. She is well-developed and well-groomed. She is obese. She is not ill-appearing, toxic-appearing or diaphoretic.  HENT:     Head: Normocephalic and atraumatic.     Jaw: There is normal jaw occlusion.     Right Ear: Hearing, tympanic membrane, ear canal and external ear normal.     Left Ear: Hearing, tympanic membrane, ear canal and external ear normal.     Nose: Nose  normal.     Mouth/Throat:     Lips: Pink.     Mouth: Mucous membranes are moist.     Pharynx: Oropharynx is clear. Uvula midline.  Eyes:     General: Lids are normal.     Extraocular Movements: Extraocular movements intact.     Conjunctiva/sclera: Conjunctivae normal.     Pupils: Pupils are equal, round, and reactive to light.  Neck:     Musculoskeletal: Normal range of motion and neck supple.     Thyroid: No thyroid mass, thyromegaly or thyroid tenderness.     Vascular: No carotid bruit or JVD.     Trachea: Trachea and phonation normal.  Cardiovascular:     Rate and Rhythm: Normal rate and regular rhythm.     Chest Wall: PMI is not displaced.     Pulses: Normal pulses.     Heart sounds: Normal heart sounds. No murmur. No friction rub. No gallop.   Pulmonary:     Effort: Pulmonary effort is normal. No respiratory distress.     Breath sounds: Normal breath sounds. No wheezing.  Abdominal:     General: Abdomen is protuberant. Bowel sounds are normal. There is no distension or abdominal bruit.     Palpations: Abdomen is soft. There is no hepatomegaly or splenomegaly.     Tenderness: There is no abdominal tenderness. There is no right CVA tenderness or left CVA tenderness.     Hernia: No hernia is present.  Musculoskeletal: Normal range of motion.     Right lower leg: 2+ Edema present.     Left lower leg: 2+ Edema present.  Lymphadenopathy:     Cervical: No cervical adenopathy.  Skin:    General: Skin is warm and dry.     Capillary Refill: Capillary refill takes less than 2 seconds.     Coloration: Skin is not cyanotic, jaundiced or pale.     Findings: No rash.  Neurological:     General: No focal deficit present.     Mental Status: She is alert and oriented to person, place, and time.     Cranial Nerves: Cranial nerves are intact. No cranial nerve deficit.     Sensory: Sensation is intact. No sensory deficit.  Motor: Motor function is intact. No weakness.     Coordination:  Coordination is intact. Coordination normal.     Gait: Gait is intact. Gait normal.     Deep Tendon Reflexes: Reflexes are normal and symmetric.  Psychiatric:        Attention and Perception: Attention and perception normal.        Mood and Affect: Mood and affect normal.        Speech: Speech is delayed (with episodes of expressive aphasia).        Behavior: Behavior normal. Behavior is cooperative.        Thought Content: Thought content normal.        Cognition and Memory: Cognition and memory normal.        Judgment: Judgment normal.     Results for orders placed or performed in visit on 10/23/18  CMP14+EGFR  Result Value Ref Range   Glucose WILL FOLLOW    BUN WILL FOLLOW    Creatinine, Ser WILL FOLLOW    GFR calc non Af Amer WILL FOLLOW    GFR calc Af Amer WILL FOLLOW    BUN/Creatinine Ratio WILL FOLLOW    Sodium WILL FOLLOW    Potassium WILL FOLLOW    Chloride WILL FOLLOW    CO2 WILL FOLLOW    Calcium WILL FOLLOW    Total Protein WILL FOLLOW    Albumin WILL FOLLOW    Globulin, Total WILL FOLLOW    Albumin/Globulin Ratio WILL FOLLOW    Bilirubin Total WILL FOLLOW    Alkaline Phosphatase WILL FOLLOW    AST WILL FOLLOW    ALT WILL FOLLOW   CBC with Differential/Platelet  Result Value Ref Range   WBC 7.7 3.4 - 10.8 x10E3/uL   RBC 4.55 3.77 - 5.28 x10E6/uL   Hemoglobin 12.2 11.1 - 15.9 g/dL   Hematocrit 38.1 34.0 - 46.6 %   MCV 84 79 - 97 fL   MCH 26.8 26.6 - 33.0 pg   MCHC 32.0 31.5 - 35.7 g/dL   RDW 15.5 (H) 11.7 - 15.4 %   Platelets 308 150 - 450 x10E3/uL   Neutrophils 69 Not Estab. %   Lymphs 24 Not Estab. %   Monocytes 5 Not Estab. %   Eos 1 Not Estab. %   Basos 1 Not Estab. %   Neutrophils Absolute 5.3 1.4 - 7.0 x10E3/uL   Lymphocytes Absolute 1.9 0.7 - 3.1 x10E3/uL   Monocytes Absolute 0.4 0.1 - 0.9 x10E3/uL   EOS (ABSOLUTE) 0.1 0.0 - 0.4 x10E3/uL   Basophils Absolute 0.0 0.0 - 0.2 x10E3/uL   Immature Granulocytes 0 Not Estab. %   Immature Grans (Abs)  0.0 0.0 - 0.1 x10E3/uL  Lipid panel  Result Value Ref Range   Cholesterol, Total WILL FOLLOW    Triglycerides WILL FOLLOW    HDL WILL FOLLOW    VLDL Cholesterol Cal WILL FOLLOW    LDL Chol Calc (NIH) WILL FOLLOW    Lipid Comment: WILL FOLLOW    Chol/HDL Ratio WILL FOLLOW   Thyroid Panel With TSH  Result Value Ref Range   TSH WILL FOLLOW    T4, Total WILL FOLLOW    T3 Uptake Ratio WILL FOLLOW    Free Thyroxine Index WILL FOLLOW   Bayer DCA Hb A1c Waived  Result Value Ref Range   HB A1C (BAYER DCA - WAIVED) >14.0 (H) <7.0 %       Pertinent labs & imaging results that were available during  my care of the patient were reviewed by me and considered in my medical decision making.  Assessment & Plan:  Lovette was seen today for establish care, diabetes and hypertension.  Diagnoses and all orders for this visit:  Type 2 diabetes mellitus without complication, with long-term current use of insulin (HCC) A1C remains greater than 14 today. Importance of blood sugar control discussed in detail. Pt is currently taking 18 units of 70/30, will increase to 40 units. Pt aware to check blood sugars and report any persistent high or low readings. Pt to seek help with medications. Pt planning on following up with social services. Labs pending. Diet and exercise encouraged. Follow up in 3 months or sooner if needed. If unable to gain control of blood glucose, will refer to endocrinology.  -     CMP14+EGFR -     Lipid panel -     Thyroid Panel With TSH -     Bayer DCA Hb A1c Waived -     Microalbumin / creatinine urine ratio -     insulin NPH-regular Human (NOVOLIN 70/30) (70-30) 100 UNIT/ML injection; Inject 40 Units into the skin 2 (two) times daily with a meal.  Hypertension associated with type 2 diabetes mellitus (HCC) BP poorly controlled. Changes were not made in regimen today as pt has not taken medications this morning. Daily blood pressure log given with instructions on how to fill out and  told to bring to next visit. Goal BP 130/80. Pt aware to report any persistent high or low readings. DASH diet and exercise encouraged. Exercise at least 150 minutes per week and increase as tolerated. Goal BMI > 25. Stress management encouraged. Smoking cessation discussed. Avoid excessive alcohol. Avoid NSAID's. Avoid more than 2000 mg of sodium daily. Medications as prescribed. Follow up as scheduled.  -     losartan (COZAAR) 25 MG tablet; Take 1 tablet (25 mg total) by mouth daily. -     CMP14+EGFR -     CBC with Differential/Platelet -     Lipid panel -     Thyroid Panel With TSH -     Microalbumin / creatinine urine ratio  Anemia, unspecified type Labs pending.  -     CBC with Differential/Platelet  Aphasia due to recent cerebrovascular accident (CVA) Pt aware to follow up with neurology as scheduled. Pt aware of symptoms that require emergent evaluation and treatment.   Restless leg syndrome Doing well on below, will continue.  -     rOPINIRole (REQUIP) 1 MG tablet; Take 1 tablet (1 mg total) by mouth at bedtime.  GERD without esophagitis No red flags present. Diet discussed. Avoid fried, spicy, fatty, greasy, and acidic foods. Avoid caffeine, nicotine, and alcohol. Do not eat 2-3 hours before bedtime and stay upright for at least 1-2 hours after eating. Eat small frequent meals. Avoid NSAID's like motrin and aleve. Medications as prescribed. Report any new or worsening symptoms. Follow up as discussed or sooner if needed.   -     omeprazole (PRILOSEC) 20 MG capsule; Take 1 capsule (20 mg total) by mouth daily. -     CBC with Differential/Platelet  Morbid obesity (HCC) Diet and exercise encouraged.     Continue all other maintenance medications.  Follow up plan: Return in about 3 months (around 01/22/2019), or if symptoms worsen or fail to improve, for DM, HTN.  Continue healthy lifestyle choices, including diet (rich in fruits, vegetables, and lean proteins, and low in salt  and  simple carbohydrates) and exercise (at least 30 minutes of moderate physical activity daily).  Educational handout given for DM  The above assessment and management plan was discussed with the patient. The patient verbalized understanding of and has agreed to the management plan. Patient is aware to call the clinic if they develop any new symptoms or if symptoms persist or worsen. Patient is aware when to return to the clinic for a follow-up visit. Patient educated on when it is appropriate to go to the emergency department.   Total time spent with patient 65 minutes.  Greater than 50% of encounter spent in coordination of care/counseling.  Monia Pouch, FNP-C [facility] [phone]

## 2018-10-23 NOTE — Patient Instructions (Signed)

## 2018-10-24 LAB — LIPID PANEL
Chol/HDL Ratio: 2.3 ratio (ref 0.0–4.4)
Cholesterol, Total: 116 mg/dL (ref 100–199)
HDL: 51 mg/dL (ref >39)
LDL Chol Calc (NIH): 50 mg/dL (ref 0–99)
Triglycerides: 76 mg/dL (ref 0–149)
VLDL Cholesterol Cal: 15 mg/dL (ref 5–40)

## 2018-10-24 LAB — CMP14+EGFR
ALT: 9 IU/L (ref 0–32)
AST: 7 IU/L (ref 0–40)
Albumin/Globulin Ratio: 1.8 (ref 1.2–2.2)
Albumin: 4 g/dL (ref 3.8–4.8)
Alkaline Phosphatase: 83 IU/L (ref 39–117)
BUN/Creatinine Ratio: 13 (ref 12–28)
BUN: 12 mg/dL (ref 8–27)
Bilirubin Total: 0.3 mg/dL (ref 0.0–1.2)
CO2: 23 mmol/L (ref 20–29)
Calcium: 8.9 mg/dL (ref 8.7–10.3)
Chloride: 98 mmol/L (ref 96–106)
Creatinine, Ser: 0.89 mg/dL (ref 0.57–1.00)
GFR calc Af Amer: 79 mL/min/{1.73_m2} (ref >59)
GFR calc non Af Amer: 69 mL/min/{1.73_m2} (ref >59)
Globulin, Total: 2.2 g/dL (ref 1.5–4.5)
Glucose: 302 mg/dL — ABNORMAL HIGH (ref 65–99)
Potassium: 4.3 mmol/L (ref 3.5–5.2)
Sodium: 135 mmol/L (ref 134–144)
Total Protein: 6.2 g/dL (ref 6.0–8.5)

## 2018-10-24 LAB — CBC WITH DIFFERENTIAL/PLATELET
Basophils Absolute: 0 10*3/uL (ref 0.0–0.2)
Basos: 1 %
EOS (ABSOLUTE): 0.1 10*3/uL (ref 0.0–0.4)
Eos: 1 %
Hematocrit: 38.1 % (ref 34.0–46.6)
Hemoglobin: 12.2 g/dL (ref 11.1–15.9)
Immature Grans (Abs): 0 10*3/uL (ref 0.0–0.1)
Immature Granulocytes: 0 %
Lymphocytes Absolute: 1.9 10*3/uL (ref 0.7–3.1)
Lymphs: 24 %
MCH: 26.8 pg (ref 26.6–33.0)
MCHC: 32 g/dL (ref 31.5–35.7)
MCV: 84 fL (ref 79–97)
Monocytes Absolute: 0.4 10*3/uL (ref 0.1–0.9)
Monocytes: 5 %
Neutrophils Absolute: 5.3 10*3/uL (ref 1.4–7.0)
Neutrophils: 69 %
Platelets: 308 10*3/uL (ref 150–450)
RBC: 4.55 x10E6/uL (ref 3.77–5.28)
RDW: 15.5 % — ABNORMAL HIGH (ref 11.7–15.4)
WBC: 7.7 10*3/uL (ref 3.4–10.8)

## 2018-10-24 LAB — MICROALBUMIN / CREATININE URINE RATIO
Creatinine, Urine: 105.6 mg/dL
Microalb/Creat Ratio: 28 mg/g creat (ref 0–29)
Microalbumin, Urine: 29.9 ug/mL

## 2018-10-24 LAB — THYROID PANEL WITH TSH
Free Thyroxine Index: 2 (ref 1.2–4.9)
T3 Uptake Ratio: 27 % (ref 24–39)
T4, Total: 7.3 ug/dL (ref 4.5–12.0)
TSH: 2.27 u[IU]/mL (ref 0.450–4.500)

## 2018-10-31 ENCOUNTER — Telehealth: Payer: Self-pay | Admitting: Family Medicine

## 2018-10-31 DIAGNOSIS — I69320 Aphasia following cerebral infarction: Secondary | ICD-10-CM

## 2018-10-31 NOTE — Telephone Encounter (Signed)
REFERRAL REQUEST Telephone Note  What type of referral do you need? neurology  Have you been seen at our office for this problem? yes (Advise that they will likely need an appointment with their PCP before a referral can be done)  Is there a particular doctor or location that you prefer? Tracy Farrell  Patient notified that referrals can take up to a week or longer to process. If they haven't heard anything within a week they should call back and speak with the referral department.

## 2018-11-06 NOTE — Telephone Encounter (Signed)
Is this ok to do?

## 2018-11-06 NOTE — Telephone Encounter (Signed)
Referral placed.

## 2018-11-06 NOTE — Telephone Encounter (Signed)
Yes, that is fine. 

## 2018-11-22 ENCOUNTER — Encounter: Payer: Self-pay | Admitting: Nurse Practitioner

## 2018-11-22 ENCOUNTER — Ambulatory Visit (INDEPENDENT_AMBULATORY_CARE_PROVIDER_SITE_OTHER): Payer: Medicaid Other | Admitting: Nurse Practitioner

## 2018-11-22 DIAGNOSIS — R5383 Other fatigue: Secondary | ICD-10-CM

## 2018-11-22 DIAGNOSIS — R519 Headache, unspecified: Secondary | ICD-10-CM

## 2018-11-22 DIAGNOSIS — R61 Generalized hyperhidrosis: Secondary | ICD-10-CM

## 2018-11-22 DIAGNOSIS — R6883 Chills (without fever): Secondary | ICD-10-CM

## 2018-11-22 DIAGNOSIS — Z20828 Contact with and (suspected) exposure to other viral communicable diseases: Secondary | ICD-10-CM

## 2018-11-22 DIAGNOSIS — Z20822 Contact with and (suspected) exposure to covid-19: Secondary | ICD-10-CM

## 2018-11-22 NOTE — Progress Notes (Signed)
   Virtual Visit via telephone Note Due to COVID-19 pandemic this visit was conducted virtually. This visit type was conducted due to national recommendations for restrictions regarding the COVID-19 Pandemic (e.g. social distancing, sheltering in place) in an effort to limit this patient's exposure and mitigate transmission in our community. All issues noted in this document were discussed and addressed.  A physical exam was not performed with this format.  I connected with Tracy Farrell on 11/22/18 at 3:30 by telephone and verified that I am speaking with the correct person using two identifiers. Tracy Farrell is currently located at home and no one is currently with her during visit. The provider, Mary-Margaret Hassell Done, FNP is located in their office at time of visit.  I discussed the limitations, risks, security and privacy concerns of performing an evaluation and management service by telephone and the availability of in person appointments. I also discussed with the patient that there may be a patient responsible charge related to this service. The patient expressed understanding and agreed to proceed.   History and Present Illness:   Chief Complaint: Chest Pain   HPI Patient calls in c/o the last 3 nights waking up sweating then she would get chills. Did not happen last night. She says during the day she feels really weak and tired and she has developed a headache.    \Review of Systems  Constitutional: Positive for chills, diaphoresis and malaise/fatigue. Negative for fever and weight loss.  Eyes: Negative for blurred vision, double vision and pain.  Respiratory: Negative for shortness of breath.   Cardiovascular: Negative for chest pain, palpitations, orthopnea and leg swelling.  Gastrointestinal: Negative for abdominal pain.  Skin: Negative for rash.  Neurological: Positive for headaches. Negative for dizziness, sensory change, loss of consciousness and weakness.   Endo/Heme/Allergies: Negative for polydipsia. Does not bruise/bleed easily.  Psychiatric/Behavioral: Negative for memory loss. The patient does not have insomnia.   All other systems reviewed and are negative.     Observations/Objective: Alert and oriented- answers all questions appropriately No distress    Assessment and Plan: Tracy Farrell in today with chief complaint of Chest Pain   1. Fatigue, unspecified type  2. Night sweats  3 . Chills  4. Acute nonintractable headache, unspecified headache type  5. Encounter by telehealth for suspected COVID-19  Force fluids Rest covid testing discussed Quarantine until results are back  Follow Up Instructions: prn    I discussed the assessment and treatment plan with the patient. The patient was provided an opportunity to ask questions and all were answered. The patient agreed with the plan and demonstrated an understanding of the instructions.   The patient was advised to call back or seek an in-person evaluation if the symptoms worsen or if the condition fails to improve as anticipated.  The above assessment and management plan was discussed with the patient. The patient verbalized understanding of and has agreed to the management plan. Patient is aware to call the clinic if symptoms persist or worsen. Patient is aware when to return to the clinic for a follow-up visit. Patient educated on when it is appropriate to go to the emergency department.   Time call ended:  4:40 I provided 10 minutes of non-face-to-face time during this encounter.    Mary-Margaret Hassell Done, FNP

## 2018-11-24 ENCOUNTER — Other Ambulatory Visit: Payer: Self-pay | Admitting: Family Medicine

## 2018-11-24 DIAGNOSIS — E1159 Type 2 diabetes mellitus with other circulatory complications: Secondary | ICD-10-CM

## 2018-11-24 DIAGNOSIS — I10 Essential (primary) hypertension: Secondary | ICD-10-CM

## 2018-11-26 ENCOUNTER — Telehealth: Payer: Self-pay

## 2018-11-26 NOTE — Telephone Encounter (Signed)
pt. eligibility with [employer] is 11/26/2018 till 11/26/2019. Have not faxed over [employer]ist application because we still the following documents from pt. pt and spoused signed [id number] and Spouse SSI Letter.  Drema Halon

## 2018-12-12 ENCOUNTER — Ambulatory Visit: Payer: Self-pay | Admitting: Physician Assistant

## 2018-12-12 ENCOUNTER — Other Ambulatory Visit: Payer: Self-pay

## 2018-12-12 DIAGNOSIS — Z20822 Contact with and (suspected) exposure to covid-19: Secondary | ICD-10-CM

## 2018-12-14 LAB — NOVEL CORONAVIRUS, NAA: SARS-CoV-2, NAA: NOT DETECTED

## 2018-12-17 ENCOUNTER — Telehealth: Payer: Self-pay

## 2018-12-17 NOTE — Telephone Encounter (Signed)
Called and informed patient that test for Covid 19 was NEGATIVE. Discussed signs and symptoms of Covid 19 : fever, chills, respiratory symptoms, cough, ENT symptoms, sore throat, SOB, muscle pain, diarrhea, headache, loss of taste/smell, close exposure to COVID-19 patient. Pt instructed to call PCP if they develop the above signs and sx. Pt also instructed to call 911 if having respiratory issues/distress. Discussed MyChart enrollment. Pt verbalized understanding.  

## 2018-12-26 ENCOUNTER — Other Ambulatory Visit: Payer: Self-pay

## 2018-12-26 ENCOUNTER — Encounter: Payer: Self-pay | Admitting: Physician Assistant

## 2018-12-26 ENCOUNTER — Ambulatory Visit: Payer: Medicaid Other | Admitting: Physician Assistant

## 2018-12-26 VITALS — BP 172/80 | HR 101 | Temp 97.9°F

## 2018-12-26 DIAGNOSIS — E1165 Type 2 diabetes mellitus with hyperglycemia: Secondary | ICD-10-CM

## 2018-12-26 DIAGNOSIS — G40909 Epilepsy, unspecified, not intractable, without status epilepticus: Secondary | ICD-10-CM

## 2018-12-26 DIAGNOSIS — F329 Major depressive disorder, single episode, unspecified: Secondary | ICD-10-CM

## 2018-12-26 DIAGNOSIS — Z8673 Personal history of transient ischemic attack (TIA), and cerebral infarction without residual deficits: Secondary | ICD-10-CM

## 2018-12-26 DIAGNOSIS — F32A Depression, unspecified: Secondary | ICD-10-CM

## 2018-12-26 DIAGNOSIS — I10 Essential (primary) hypertension: Secondary | ICD-10-CM

## 2018-12-26 DIAGNOSIS — Z7689 Persons encountering health services in other specified circumstances: Secondary | ICD-10-CM

## 2018-12-26 DIAGNOSIS — R609 Edema, unspecified: Secondary | ICD-10-CM

## 2018-12-26 MED ORDER — FUROSEMIDE 20 MG PO TABS: 20.0000 mg | ORAL_TABLET | Freq: Every day | ORAL | 1 refills | Status: DC

## 2018-12-26 MED ORDER — INSULIN GLARGINE 100 UNIT/ML ~~LOC~~ SOLN: 40.0000 [IU] | Freq: Every day | SUBCUTANEOUS | 0 refills | Status: DC

## 2018-12-26 MED ORDER — CITALOPRAM HYDROBROMIDE 20 MG PO TABS: 20.0000 mg | ORAL_TABLET | Freq: Every day | ORAL | 0 refills | Status: DC

## 2018-12-26 NOTE — Progress Notes (Signed)
BP (!) 172/80   Pulse (!) 101   Temp 97.9 F (36.6 C)   SpO2 99%    Subjective:    Patient ID: [facility], female    DOB: Nov 03, 1955, 63 y.o.   MRN: [id number]  HPI: Tracy [facility] is a 63 y.o. female presenting on 12/26/2018 for New Patient (Initial Visit) (previous patient with Dr. [name]. pt states Dr. [name] is now retired and was last seen by her Feb or march 2020) and Edema (bilateral LE for about 3 weeks.)   HPI   Pt had a negative covid 19 screening questionnaire and a negative test last week/ 12/12/18.    Patient is a 63 year old female who presents to office today to establish care as PCP.  Patient with history of diabetes, hypertension, anemia, GERD and aphasia due to recent stroke.   Patient was previously treated at [facility].  She was last seen there on 11/22/18.  Despite this patient continues to report that her previous PCP was Dr. [name] in [facility] who has since retired.  Patient was recently discharged from [facility] on  10/09/18 having had a stroke and altered mental status.  Patient reports that she is in pain and she is depressed and she cries a lot.  She currently lives with her husband and grandson.  Patient reports that her daughter passed away in May 23, 2022.  She doesn't know what from.  She lived across the street.  She had been not feeling well.  She denies SI, HI.  Pt reports having had edema in the past but it had gone away and just started coming back past couple of weeks.  Patient had echo done on 10/08/2018 which revealed an ejection fraction of 55% with some septal hypertrophy and mild LVH.  EKG was ordered 10/07/18 but nothing in chart.   Today she is having a little bit of HA but nothing much and no different that usual, no vision changes, some burning reflux in her throat and chest but no Chest Pain.    She denies dyspnea, orthopnea.   This morning fbs 191.  This is typical for her.   Pt states that last week for 3 nights in a  row she was awakened sweating and had low bs ( in the 50s).    Most recent a1c was > 14 on 10/23/18.  A1c was Also done 10/08/18 it was 14.8.  She is currently using Novolin 70/30 due to cost.   She has not followed up with neurologist after CVA as recommended due to no insurance and it's too expensive.   She has bp machine at home but doesn't know exactly where it is.    She isn't taking her gabapenin or lisinopril but has them (pills in the bottles).  Pt states last mammogram was feb 2019 (at [facility])- records for this unavailable at this time.    Relevant past medical, surgical, family and social history reviewed and updated as indicated. Interim medical history since our last visit reviewed. Allergies and medications reviewed and updated.   Current Outpatient Medications:  .  aspirin EC 81 MG tablet, Take 81 mg by mouth daily., Disp: , Rfl:  .  atorvastatin (LIPITOR) 40 MG tablet, Take 1 tablet (40 mg total) by mouth daily at 6 PM., Disp: 30 tablet, Rfl: 3 .  insulin NPH-regular Human (NOVOLIN 70/30) (70-30) 100 UNIT/ML injection, Inject 40 Units into the skin 2 (two) times daily with a meal., Disp: 10.8 mL, Rfl:  3 .  levETIRAcetam (KEPPRA) 500 MG tablet, Take 1 tablet (500 mg total) by mouth 2 (two) times daily., Disp: 60 tablet, Rfl: 2 .  losartan (COZAAR) 25 MG tablet, Take 1 tablet by mouth once daily, Disp: 90 tablet, Rfl: 1 .  metFORMIN (GLUCOPHAGE) 1000 MG tablet, Take 1,000 mg by mouth 2 (two) times daily with a meal., Disp: , Rfl:  .  omeprazole (PRILOSEC) 20 MG capsule, Take 1 capsule (20 mg total) by mouth daily., Disp: 90 capsule, Rfl: 1 .  rOPINIRole (REQUIP) 1 MG tablet, Take 1 tablet (1 mg total) by mouth at bedtime., Disp: 90 tablet, Rfl: 1 .  gabapentin (NEURONTIN) 300 MG capsule, Take 300 mg by mouth 3 (three) times daily., Disp: , Rfl:  .  lisinopril (ZESTRIL) 5 MG tablet, Take 5 mg by mouth daily., Disp: , Rfl:  .  naproxen (NAPROSYN) 500 MG tablet, Take 500 mg by  mouth 2 (two) times daily as needed., Disp: , Rfl:      Review of Systems  Per HPI unless specifically indicated above     Objective:    BP (!) 172/80   Pulse (!) 101   Temp 97.9 F (36.6 C)   SpO2 99%   Wt Readings from Last 3 Encounters:  10/23/18 202 lb (91.6 kg)  10/09/18 204 lb 2.3 oz (92.6 kg)  08/17/11 206 lb (93.4 kg)    Physical Exam Vitals signs reviewed.  Constitutional:      General: She is not in acute distress.    Appearance: She is well-developed. She is obese. She is not toxic-appearing.  HENT:     Head: Normocephalic and atraumatic.  Neck:     Musculoskeletal: Neck supple.  Cardiovascular:     Rate and Rhythm: Normal rate and regular rhythm.     Pulses:          Dorsalis pedis pulses are 2+ on the right side and 2+ on the left side.  Pulmonary:     Effort: Pulmonary effort is normal.     Breath sounds: Normal breath sounds.  Abdominal:     General: Bowel sounds are normal.     Palpations: Abdomen is soft. There is no mass.     Tenderness: There is no abdominal tenderness.  Musculoskeletal:     Right lower leg: She exhibits tenderness. She exhibits no bony tenderness. Edema present.     Left lower leg: Edema present.       Legs:  Lymphadenopathy:     Cervical: No cervical adenopathy.  Skin:    General: Skin is warm and dry.  Neurological:     Mental Status: She is alert and oriented to person, place, and time.     Gait: Gait abnormal.  Psychiatric:        Behavior: Behavior normal.           Assessment & Plan:    Encounter Diagnoses  Name Primary?  . Encounter to establish care Yes  . Uncontrolled type 2 diabetes mellitus with hyperglycemia ([facility])   . Essential hypertension   . Depression, unspecified depression type   . Edema, unspecified type   . History of recent stroke   . Seizure disorder [facility])       Patient to get fasting labs drawn tomorrow  social services called during [place].  Social services mailed mediaid  application but pt doesn't know much about that, she says her daughter takes care of most of that.  Social services says that  patient will  be approved for medicaid if all paperwork is submitted.  She is given application for [facility] financial assistance today.   Add lasix. Elevate feet.  Pt will need to see neurology for seizure d/o  Patient to get back on lisinopril- 10 mg daily.  Patient is to locate her blood pressure machine and start checking blood pressure daily.  She is to record her blood pressure readings on the back.   Start lantus (samples given).  Patient is to start Lantus and discontinue the Novolin 70/30.  She is to start on Lantus 30 units once daily.  If her morning fasting blood sugars are still running above 150 after the first 2 or 3 days, she is to increase the Lantus to 35 units daily.  If Her morning fasting blood sugar still running over 150 after 2 or 3 days, and she is increased the Lantus to 40 units once daily.  Patient is counseled to call office for fasting blood sugar less than 70 or over 300.  Pt is given written instructions-  Will start on citalopram for derpression  F/u with telemedicine visit 7-10 day.  To check bs, bp, review labs

## 2018-12-26 NOTE — Patient Instructions (Addendum)
Can restart gabapentin  Restart lisinopril (take 2 tabs of 5mg )   Locate your bp machine and start checking it daily.  You can record it on back of blood sugar log  lantus insuin-  Stop the 70/30 insulin and start lantus    -Start 30 units once daily- if blood sugars still running over 150 after 2 or 3 days, increase to 35 units daily.   - if blood sugars still running over 150 after 2 or 3 days, increase to 40 units daily.   -call office for fasting blood sugars less than 70 or more than 300.  Get fasting labs/bloodwork drawn at [facility]   ------------------------------------------------   Insulin Injection Instructions, Single Insulin Dose, Adult A subcutaneous injection is a shot of medicine that is injected into the layer of fat and tissue between skin and muscle. People with type 1 diabetes must take insulin because their bodies do not make it. People with type 2 diabetes may need to take insulin.  There are many different types of insulin. The type of insulin that you take may determine how many injections you give yourself and when you need to give the injections. Supplies needed:  Soap and water to wash hands.  A new, unused insulin syringe.  Your insulin medication bottle (vial).  Alcohol wipes.  A disposal container that is meant for sharp items (sharps container), such as an empty plastic bottle with a cover. How to choose a site for injection The body absorbs insulin differently, depending on where the insulin is injected (injection site). It is best to inject insulin into the same body area each time (for example, always in the abdomen), but you should use a different spot in that area for each injection. Do not inject the insulin in the same spot each time. There are five main areas that can be used for injecting. These areas include:  Abdomen. This is the preferred area.  Front of thigh.  Upper, outer side of thigh.  Upper, outer side of arm.  Upper,  outer part of buttock. How to give a single-dose insulin injection First, follow the steps for Get ready, then continue with the steps for Push air into the vial, then follow the steps for Fill the syringe, and finish with the steps for Inject the insulin. Get ready 1. Wash your hands with soap and water. If soap and water are not available, use hand sanitizer. 2. Before you give yourself an insulin injection, be sure to test your blood sugar level (blood glucose level) and write down that number. Follow any instructions from your health care provider about what to do if your blood glucose level is higher or lower than your normal range. 3. Use a new, unused insulin syringe each time you need to inject insulin. 4. Check to make sure you have the correct type of insulin syringe for the concentration of insulin that you are using. 5. Check the expiration date and the type of insulin that you are using. 6. If you are using CLEAR insulin, check to see that it is clear and free of clumps. 7. Do not shake the vial to get it ready. Gently roll the vial between your palms several times. 8. Remove the plastic pop-top covering from the vial of insulin. This type of covering is present on a vial when it is new. 9. Use an alcohol wipe to clean the rubber top of the vial. 10. Remove the plastic cover from the syringe needle.  Do not let the needle touch anything. Push air into the vial 1. To bring (draw up) air into the syringe, slowly pull back on the syringe plunger. Stop pulling the plunger when the dose indicator gets to the number of units that you will be using. 2. While you keep the vial right-side-up, poke the needle through the rubber top of the vial. Do not turn the vial upside down to do this. 3. Push the plunger all the way into the syringe. Doing that will push air into the vial. 4. Do not take the needle out of the vial yet. Fill the syringe  1. While the needle is still in the vial, turn the  vial upside down and hold it at eye level. 2. Slowly pull back on the plunger. Stop pulling the plunger when the dose indicator gets to the desired number of units. 3. If you see air bubbles in the syringe, slowly move the plunger up and down 2 or 3 times to make them go away. ? If you had to move the plunger to get rid of air bubbles, pull back the plunger until the dose indicator returns to the correct dose. 4. Remove the needle from the vial. Do not let the needle touch anything. Inject the insulin  1. Use an alcohol wipe to clean the site where you will be injecting the needle. Let the site air-dry. 2. Hold the syringe in your writing hand like a pencil. 3. Use your other hand to pinch and hold about an inch (2.5 cm) of skin. Do not directly touch the cleaned part of the skin. 4. Gently but quickly, put the needle straight into the skin. The needle should be at a 90-degree angle (perpendicular) to the skin. 5. Push the needle in as far as it will go (to the hub). 6. When the needle is completely inserted into the skin, use your thumb or index finger of your writing hand to push the plunger all the way into the syringe to inject the insulin. 7. Let go of the skin that you are pinching. Continue to hold the syringe in place with your writing hand. 8. Wait 10 seconds, then pull the needle straight out of the skin. This will allow all of the insulin to go from the syringe and needle into your body. 9. Press and hold the alcohol wipe over the injection site until any bleeding stops. Do not rub the area. 10. Do not put the plastic cover back on the needle. 11. Discard the syringe and needle directly into a sharps container, such as an empty plastic bottle with a cover. How to throw away supplies  Discard all used needles in a puncture-proof sharps disposal container. You can ask your local pharmacy about where you can get this kind of disposal container, or you can use an empty plastic liquid  laundry detergent bottle that has a cover.  Follow the disposal regulations for the area where you live. Do not use any syringe or needle more than one time.  Throw away empty vials in the regular trash. Questions to ask your health care provider  How often should I be taking insulin?  How often should I check my blood glucose?  What amount of insulin should I be taking at each time?  What are the side effects?  What should I do if my blood glucose is too high?  What should I do if my blood glucose is too low?  What should I do  if I forget to take my insulin?  What number should I call if I have questions? Where to find more information  American Diabetes Association (ADA): www.diabetes.org  American Association of Diabetes Educators (AADE) Patient Resources: https://www.diabeteseducator.org Summary  A subcutaneous injection is a shot of medicine that is injected into the layer of fat and tissue between skin and muscle.  Before you give yourself an insulin injection, be sure to test your blood sugar level (blood glucose level) and write down that number.  The type of insulin that you take may determine how many injections you give yourself and when you need to give the injections.  Check the expiration date and the type of insulin that you are using.  It is best to inject insulin into the same body area each time (for example, always in the abdomen), but you should use a different spot in that area for each injection. This information is not intended to replace advice given to you by your health care provider. Make sure you discuss any questions you have with your health care provider. Document Released: 02/13/2015 Document Revised: 01/13/2017 Document Reviewed: 02/13/2015 Elsevier Patient Education  2020 [employer].

## 2018-12-27 ENCOUNTER — Other Ambulatory Visit (HOSPITAL_COMMUNITY)
Admission: RE | Admit: 2018-12-27 | Discharge: 2018-12-27 | Disposition: A | Discharge status: Home / Self Care | Payer: Medicaid Other | Source: Ambulatory Visit | Attending: Physician Assistant | Admitting: Physician Assistant

## 2018-12-27 DIAGNOSIS — E1165 Type 2 diabetes mellitus with hyperglycemia: Secondary | ICD-10-CM

## 2018-12-27 DIAGNOSIS — I10 Essential (primary) hypertension: Secondary | ICD-10-CM | POA: Diagnosis present

## 2018-12-27 DIAGNOSIS — R69 Illness, unspecified: Secondary | ICD-10-CM | POA: Diagnosis present

## 2018-12-27 DIAGNOSIS — R609 Edema, unspecified: Secondary | ICD-10-CM | POA: Diagnosis present

## 2018-12-27 LAB — COMPREHENSIVE METABOLIC PANEL
ALT: 12 U/L (ref 0–44)
AST: 11 U/L — ABNORMAL LOW (ref 15–41)
Albumin: 3.6 g/dL (ref 3.5–5.0)
Alkaline Phosphatase: 73 U/L (ref 38–126)
Anion gap: 11 (ref 5–15)
BUN: 19 mg/dL (ref 8–23)
CO2: 25 mmol/L (ref 22–32)
Calcium: 9 mg/dL (ref 8.9–10.3)
Chloride: 101 mmol/L (ref 98–111)
Creatinine, Ser: 1.1 mg/dL — ABNORMAL HIGH (ref 0.44–1.00)
GFR calc Af Amer: >60 mL/min (ref >60)
GFR calc non Af Amer: 53 mL/min — ABNORMAL LOW (ref >60)
Glucose, Bld: 132 mg/dL — ABNORMAL HIGH (ref 70–99)
Potassium: 4 mmol/L (ref 3.5–5.1)
Sodium: 137 mmol/L (ref 135–145)
Total Bilirubin: 0.7 mg/dL (ref 0.3–1.2)
Total Protein: 7 g/dL (ref 6.5–8.1)

## 2018-12-27 LAB — HEMOGLOBIN A1C
Hgb A1c MFr Bld: 8.4 % — ABNORMAL HIGH (ref 4.8–5.6)
Mean Plasma Glucose: 194.38 mg/dL

## 2018-12-27 LAB — LIPID PANEL
Cholesterol: 124 mg/dL (ref 0–200)
HDL: 54 mg/dL (ref >40)
LDL Cholesterol: 55 mg/dL (ref 0–99)
Total CHOL/HDL Ratio: 2.3 RATIO
Triglycerides: 77 mg/dL (ref <150)
VLDL: 15 mg/dL (ref 0–40)

## 2019-01-02 ENCOUNTER — Ambulatory Visit: Payer: Medicaid Other | Admitting: Physician Assistant

## 2019-01-11 ENCOUNTER — Other Ambulatory Visit: Payer: Self-pay | Admitting: Family Medicine

## 2019-01-11 DIAGNOSIS — E119 Type 2 diabetes mellitus without complications: Secondary | ICD-10-CM

## 2019-01-11 DIAGNOSIS — Z794 Long term (current) use of insulin: Secondary | ICD-10-CM

## 2019-01-15 ENCOUNTER — Other Ambulatory Visit: Payer: Self-pay

## 2019-01-16 ENCOUNTER — Encounter: Payer: Self-pay | Admitting: Family Medicine

## 2019-01-16 ENCOUNTER — Ambulatory Visit: Payer: Medicaid Other | Admitting: Family Medicine

## 2019-01-16 VITALS — BP 130/74 | HR 80 | Temp 98.2°F | Resp 20 | Ht 62.0 in | Wt 218.0 lb

## 2019-01-16 DIAGNOSIS — E1169 Type 2 diabetes mellitus with other specified complication: Secondary | ICD-10-CM | POA: Diagnosis not present

## 2019-01-16 DIAGNOSIS — L03114 Cellulitis of left upper limb: Secondary | ICD-10-CM | POA: Diagnosis not present

## 2019-01-16 DIAGNOSIS — E1159 Type 2 diabetes mellitus with other circulatory complications: Secondary | ICD-10-CM | POA: Diagnosis not present

## 2019-01-16 DIAGNOSIS — I10 Essential (primary) hypertension: Secondary | ICD-10-CM

## 2019-01-16 DIAGNOSIS — I152 Hypertension secondary to endocrine disorders: Secondary | ICD-10-CM

## 2019-01-16 DIAGNOSIS — Z23 Encounter for immunization: Secondary | ICD-10-CM | POA: Diagnosis not present

## 2019-01-16 DIAGNOSIS — Z794 Long term (current) use of insulin: Secondary | ICD-10-CM

## 2019-01-16 DIAGNOSIS — E785 Hyperlipidemia, unspecified: Secondary | ICD-10-CM

## 2019-01-16 DIAGNOSIS — Z1211 Encounter for screening for malignant neoplasm of colon: Secondary | ICD-10-CM

## 2019-01-16 DIAGNOSIS — E119 Type 2 diabetes mellitus without complications: Secondary | ICD-10-CM

## 2019-01-16 DIAGNOSIS — Z1212 Encounter for screening for malignant neoplasm of rectum: Secondary | ICD-10-CM

## 2019-01-16 MED ORDER — NOVOLIN 70/30 RELION (70-30) 100 UNIT/ML ~~LOC~~ SUSP: SUBCUTANEOUS | 5 refills | Status: DC

## 2019-01-16 MED ORDER — DOXYCYCLINE HYCLATE 100 MG PO TABS: 100.0000 mg | ORAL_TABLET | Freq: Two times a day (BID) | ORAL | 0 refills | Status: AC

## 2019-01-16 MED ORDER — ASPIRIN EC 81 MG PO TBEC: 81.0000 mg | DELAYED_RELEASE_TABLET | Freq: Every day | ORAL | 2 refills | Status: DC

## 2019-01-16 MED ORDER — MUPIROCIN 2 % EX OINT: 1.0000 "application " | TOPICAL_OINTMENT | Freq: Two times a day (BID) | CUTANEOUS | 0 refills | Status: DC

## 2019-01-16 MED ORDER — LEVETIRACETAM 500 MG PO TABS: 500.0000 mg | ORAL_TABLET | Freq: Two times a day (BID) | ORAL | 2 refills | Status: DC

## 2019-01-16 NOTE — Progress Notes (Signed)
Subjective:  Patient ID: Tracy Farrell, female    DOB: 1955/05/28, 63 y.o.   MRN: [id number]  Patient Care Team: Baruch Gouty, FNP as PCP - General (Family Medicine)   Chief Complaint:  Medical Management of Chronic Issues (3 mo ), Diabetes, Hyperlipidemia, and Hypertension   HPI: Tracy Farrell is a 63 y.o. female presenting on 01/16/2019 for Medical Management of Chronic Issues (3 mo ), Diabetes, Hyperlipidemia, and Hypertension   1. Type 2 diabetes mellitus without complication, with long-term current use of insulin (HCC) Pt [facility]. Pt states she was told at her last visit that she has insurance and needed to establish with a PCP. Pt returned to clinic today to reestablish care. She reports she has been taking Novolin 70/30 40 units twice daily. States at one point she was taking Lantus but ran out of this so she restarted the 70/30 due to cost. Pt has a log of BS readings at home. Readings ranging from 79-260. Her last A1C was 8.4 on 12/27/2018. She denies polyuria, polyphagia, or polydipsia. She does have some swelling in her lower legs but does not wear compression socks. No visual changes, dizziness, or syncope.   2. Hypertension associated with type 2 diabetes mellitus ([place]) Complaint with meds - Yes Current Medications - Cozaar, Zestril, Lasix Checking BP at home ranging - 120/60 - 160/80 Exercising Regularly - No Watching Salt intake - Yes Pertinent ROS:  Headache - No Fatigue - No Visual Disturbances - No Chest pain - No Dyspnea - No Palpitations - No LE edema - Yes They report good compliance with medications and can restate their regimen by memory. No medication side effects.  Family, social, and smoking history reviewed.   BP Readings from Last 3 Encounters:  01/16/19 130/74  12/26/18 (!) 172/80  10/23/18 (!) 162/84   CMP Latest Ref Rng & Units 12/27/2018 10/23/2018 10/08/2018  Glucose 70 - 99 mg/dL 132(H) 302(H)  271(H)  BUN 8 - 23 mg/dL 19 12 14   Creatinine 0.44 - 1.00 mg/dL 1.10(H) 0.89 0.77  Sodium 135 - 145 mmol/L 137 135 132(L)  Potassium 3.5 - 5.1 mmol/L 4.0 4.3 4.2  Chloride 98 - 111 mmol/L 101 98 100  CO2 22 - 32 mmol/L 25 23 23   Calcium 8.9 - 10.3 mg/dL 9.0 8.9 8.5(L)  Total Protein 6.5 - 8.1 g/dL 7.0 6.2 5.7(L)  Total Bilirubin 0.3 - 1.2 mg/dL 0.7 0.3 0.7  Alkaline Phos 38 - 126 U/L 73 83 67  AST 15 - 41 U/L 11(L) 7 9(L)  ALT 0 - 44 U/L 12 9 12       3. Morbid obesity (HCC) Does not watch diet and does not exercise on a regular basis.   4. Hyperlipidemia associated with type 2 diabetes mellitus ([place]) Compliant with medications - Yes Current medications - Lipitor Side effects from medications - No Diet - generally unhealthy Exercise - none  Lab Results  Component Value Date   CHOL 124 12/27/2018   HDL 54 12/27/2018   LDLCALC 55 12/27/2018   TRIG 77 12/27/2018   CHOLHDL 2.3 12/27/2018     Family and personal medical history reviewed. Smoking and ETOH history reviewed.    5. Wound to left hand Pt reports having a scratch to her left hand. States she treated this for a few days. States one day she was removing the Band-Aid and caused a skin tear. States she has been cleaning  the area daily but has noticed some swelling and erythema around wound.    Relevant past medical, surgical, family, and social history reviewed and updated as indicated.  Allergies and medications reviewed and updated. Date reviewed: Chart in Epic.   Past Medical History:  Diagnosis Date  . Arthritis   . Carpal tunnel syndrome, bilateral   . Chronic back pain   . Depression   . Diabetes mellitus   . GERD (gastroesophageal reflux disease)   . Headache(784.0)   . Hyperlipemia   . Hypertension   . RLS (restless legs syndrome)   . Stroke [facility])     Past Surgical History:  Procedure Laterality Date  . ABDOMINAL HYSTERECTOMY    . CARPAL TUNNEL RELEASE     bilateral   . CARPAL TUNNEL RELEASE   08/17/2011   Procedure: CARPAL TUNNEL RELEASE;  Surgeon: Wynonia Sours, MD;  Location: [place];  Service: Orthopedics;  Laterality: Right;  re-release carpal canal hypothenar fat pad transfer  . KNEE SURGERY     right   . LUMBAR EPIDURAL INJECTION      Social History   Socioeconomic History  . Marital status: Married    Spouse name: Abe People  . Number of children: 2  . Years of education: Not on file  . Highest education level: Not on file  Occupational History  . Not on file  Tobacco Use  . Smoking status: Never Smoker  . Smokeless tobacco: Never Used  Substance and Sexual Activity  . Alcohol use: No  . Drug use: Never  . Sexual activity: Never  Other Topics Concern  . Not on file  Social History Narrative   Lives with husband , daughter passed away 04-21-2018   Social Determinants of Health   Financial Resource Strain:   . Difficulty of Paying Living Expenses: Not on file  Food Insecurity:   . Worried About Charity fundraiser in the Last Year: Not on file  . Ran Out of Food in the Last Year: Not on file  Transportation Needs:   . Lack of Transportation (Medical): Not on file  . Lack of Transportation (Non-Medical): Not on file  Physical Activity:   . Days of Exercise per Week: Not on file  . Minutes of Exercise per Session: Not on file  Stress:   . Feeling of Stress : Not on file  Social Connections:   . Frequency of Communication with Friends and Family: Not on file  . Frequency of Social Gatherings with Friends and Family: Not on file  . Attends Religious Services: Not on file  . Active Member of Clubs or Organizations: Not on file  . Attends Archivist Meetings: Not on file  . Marital Status: Not on file  Intimate Partner Violence:   . Fear of Current or Ex-Partner: Not on file  . Emotionally Abused: Not on file  . Physically Abused: Not on file  . Sexually Abused: Not on file    Outpatient Encounter Medications as of 01/16/2019    Medication Sig  . aspirin EC 81 MG tablet Take 1 tablet (81 mg total) by mouth daily.  Marland Kitchen atorvastatin (LIPITOR) 40 MG tablet Take 1 tablet (40 mg total) by mouth daily at 6 PM.  . citalopram (CELEXA) 20 MG tablet Take 1 tablet (20 mg total) by mouth daily.  . furosemide (LASIX) 20 MG tablet Take 1 tablet (20 mg total) by mouth daily.  Marland Kitchen gabapentin (NEURONTIN) 300 MG capsule Take 300  mg by mouth 3 (three) times daily.  Marland Kitchen levETIRAcetam (KEPPRA) 500 MG tablet Take 1 tablet (500 mg total) by mouth 2 (two) times daily.  Marland Kitchen lisinopril (ZESTRIL) 5 MG tablet Take 5 mg by mouth daily.  Marland Kitchen losartan (COZAAR) 25 MG tablet Take 1 tablet by mouth once daily  . metFORMIN (GLUCOPHAGE) 1000 MG tablet Take 1,000 mg by mouth 2 (two) times daily with a meal.  . NOVOLIN 70/30 RELION (70-30) 100 UNIT/ML injection INJECT 40 UNITS SUBCUTANEOUSLY TWICE DAILY WITH A MEAL  . omeprazole (PRILOSEC) 20 MG capsule Take 1 capsule (20 mg total) by mouth daily.  Marland Kitchen rOPINIRole (REQUIP) 1 MG tablet Take 1 tablet (1 mg total) by mouth at bedtime.  . [DISCONTINUED] aspirin EC 81 MG tablet Take 81 mg by mouth daily.  . [DISCONTINUED] levETIRAcetam (KEPPRA) 500 MG tablet Take 1 tablet (500 mg total) by mouth 2 (two) times daily.  . [DISCONTINUED] naproxen (NAPROSYN) 500 MG tablet Take 500 mg by mouth 2 (two) times daily as needed.  . doxycycline (VIBRA-TABS) 100 MG tablet Take 1 tablet (100 mg total) by mouth 2 (two) times daily for 10 days. 1 po bid  . mupirocin ointment (BACTROBAN) 2 % Place 1 application into the nose 2 (two) times daily.  . [DISCONTINUED] insulin glargine (LANTUS) 100 UNIT/ML injection Inject 0.4 mLs (40 Units total) into the skin daily. (Patient not taking: Reported on 01/16/2019)   No facility-administered encounter medications on file as of 01/16/2019.    Allergies  Allergen Reactions  . Sulfa Antibiotics Other (See Comments)    UNKNOWN REACTION    Review of Systems  Constitutional: Negative for  activity change, appetite change, chills, diaphoresis, fatigue, fever and unexpected weight change.  HENT: Negative.   Eyes: Negative.  Negative for photophobia and visual disturbance.  Respiratory: Negative for cough, chest tightness and shortness of breath.   Cardiovascular: Positive for leg swelling. Negative for chest pain and palpitations.  Gastrointestinal: Negative for abdominal pain, blood in stool, constipation, diarrhea, nausea and vomiting.  Endocrine: Negative.  Negative for cold intolerance, heat intolerance, polydipsia, polyphagia and polyuria.  Genitourinary: Negative for decreased urine volume, difficulty urinating, dysuria, frequency and urgency.  Musculoskeletal: Positive for gait problem (uses cane). Negative for arthralgias and myalgias.  Skin: Positive for color change and wound.  Allergic/Immunologic: Negative.   Neurological: Negative for dizziness, tremors, seizures, syncope, facial asymmetry, speech difficulty, weakness, light-headedness, numbness and headaches.  Hematological: Negative.   Psychiatric/Behavioral: Negative for confusion, hallucinations, sleep disturbance and suicidal ideas.  All other systems reviewed and are negative.       Objective:  BP 130/74   Pulse 80   Temp 98.2 F (36.8 C)   Resp 20   Ht 5\' 2"  (1.575 m)   Wt 218 lb (98.9 kg)   SpO2 99%   BMI 39.87 kg/m    Wt Readings from Last 3 Encounters:  01/16/19 218 lb (98.9 kg)  10/23/18 202 lb (91.6 kg)  10/09/18 204 lb 2.3 oz (92.6 kg)    Physical Exam Vitals and nursing note reviewed.  Constitutional:      General: She is not in acute distress.    Appearance: Normal appearance. She is well-developed and well-groomed. She is morbidly obese. She is not ill-appearing, toxic-appearing or diaphoretic.  HENT:     Head: Normocephalic and atraumatic.     Jaw: There is normal jaw occlusion.     Right Ear: Hearing normal.     Left Ear: Hearing normal.  Nose: Nose normal.      Mouth/Throat:     Lips: Pink.     Mouth: Mucous membranes are moist.     Pharynx: Oropharynx is clear. Uvula midline.  Eyes:     General: Lids are normal.     Extraocular Movements: Extraocular movements intact.     Conjunctiva/sclera: Conjunctivae normal.     Pupils: Pupils are equal, round, and reactive to light.  Neck:     Thyroid: No thyroid mass, thyromegaly or thyroid tenderness.     Vascular: No carotid bruit or JVD.     Trachea: Trachea and phonation normal.  Cardiovascular:     Rate and Rhythm: Normal rate and regular rhythm.     Chest Wall: PMI is not displaced.     Pulses: Normal pulses.     Heart sounds: Normal heart sounds. No murmur. No friction rub. No gallop.   Pulmonary:     Effort: Pulmonary effort is normal. No respiratory distress.     Breath sounds: Normal breath sounds. No wheezing.  Abdominal:     General: Bowel sounds are normal. There is no distension or abdominal bruit.     Palpations: Abdomen is soft. There is no hepatomegaly or splenomegaly.     Tenderness: There is no abdominal tenderness. There is no right CVA tenderness or left CVA tenderness.     Hernia: No hernia is present.  Musculoskeletal:        General: Normal range of motion.     Cervical back: Normal range of motion and neck supple.     Right lower leg: 1+ Edema present.     Left lower leg: 1+ Edema present.  Lymphadenopathy:     Cervical: No cervical adenopathy.  Skin:    General: Skin is warm and dry.     Capillary Refill: Capillary refill takes less than 2 seconds.     Coloration: Skin is not cyanotic, jaundiced or pale.     Findings: Erythema and wound present. No rash.       Neurological:     General: No focal deficit present.     Mental Status: She is alert and oriented to person, place, and time.     Cranial Nerves: Cranial nerves are intact.     Sensory: Sensation is intact.     Motor: Motor function is intact.     Coordination: Coordination is intact.     Gait: Gait  abnormal (uses cane, slow).     Deep Tendon Reflexes: Reflexes are normal and symmetric.  Psychiatric:        Attention and Perception: Attention and perception normal.        Mood and Affect: Mood and affect normal.        Speech: Speech normal.        Behavior: Behavior normal. Behavior is cooperative.        Thought Content: Thought content normal.        Cognition and Memory: Cognition normal. She exhibits impaired recent memory.        Judgment: Judgment normal.     Results for orders placed or performed during the hospital encounter of 12/27/18  Lipid panel  Result Value Ref Range   Cholesterol 124 0 - 200 mg/dL   Triglycerides 77 <150 mg/dL   HDL 54 >40 mg/dL   Total CHOL/HDL Ratio 2.3 RATIO   VLDL 15 0 - 40 mg/dL   LDL Cholesterol 55 0 - 99 mg/dL  Comprehensive metabolic panel  Result  Value Ref Range   Sodium 137 135 - 145 mmol/L   Potassium 4.0 3.5 - 5.1 mmol/L   Chloride 101 98 - 111 mmol/L   CO2 25 22 - 32 mmol/L   Glucose, Bld 132 (H) 70 - 99 mg/dL   BUN 19 8 - 23 mg/dL   Creatinine, Ser 1.10 (H) 0.44 - 1.00 mg/dL   Calcium 9.0 8.9 - 10.3 mg/dL   Total Protein 7.0 6.5 - 8.1 g/dL   Albumin 3.6 3.5 - 5.0 g/dL   AST 11 (L) 15 - 41 U/L   ALT 12 0 - 44 U/L   Alkaline Phosphatase 73 38 - 126 U/L   Total Bilirubin 0.7 0.3 - 1.2 mg/dL   GFR calc non Af Amer 53 (L) >60 mL/min   GFR calc Af Amer >60 >60 mL/min   Anion gap 11 5 - 15  Hemoglobin A1c  Result Value Ref Range   Hgb A1c MFr Bld 8.4 (H) 4.8 - 5.6 %   Mean Plasma Glucose 194.38 mg/dL       Pertinent labs & imaging results that were available during my care of the patient were reviewed by me and considered in my medical decision making.  Assessment & Plan:  Kinsli was seen today for medical management of chronic issues, diabetes, hyperlipidemia and hypertension.  Diagnoses and all orders for this visit:  Type 2 diabetes mellitus without complication, with long-term current use of insulin ([place]) Will  continue 70/30 and stop Lantus. Continue other medications as prescribed. Repeat labs in 3 months.  -     aspirin EC 81 MG tablet; Take 1 tablet (81 mg total) by mouth daily.  Hypertension associated with type 2 diabetes mellitus (HCC) BP well controlled. Changes were not made in regimen today. Goal BP is 130/80. Pt aware to report any persistent high or low readings. DASH diet and exercise encouraged. Exercise at least 150 minutes per week and increase as tolerated. Goal BMI > 25. Stress management encouraged. Avoid nicotine and tobacco product use. Avoid excessive alcohol and NSAID's. Avoid more than 2000 mg of sodium daily. Medications as prescribed. Follow up as scheduled.  -     aspirin EC 81 MG tablet; Take 1 tablet (81 mg total) by mouth daily.  Morbid obesity ([place]) Diet and exercise encouraged.  Recent labs from hospital stay reviewed.   Hyperlipidemia associated with type 2 diabetes mellitus ([place]) Diet encouraged - increase intake of fresh fruits and vegetables, increase intake of lean proteins. Bake, broil, or grill foods. Avoid fried, greasy, and fatty foods. Avoid fast foods. Increase intake of fiber-rich whole grains. Exercise encouraged - at least 150 minutes per week and advance as tolerated.  Goal BMI < 25. Continue medications as prescribed. Follow up in 3-6 months as discussed.   Cellulitis of hand, left Will initiate Bactroban ointment as below. If wound does not improve in 2-3 days, pt will initiate doxycycline.  -     mupirocin ointment (BACTROBAN) 2 %; Place 1 application into the nose 2 (two) times daily. -     doxycycline (VIBRA-TABS) 100 MG tablet; Take 1 tablet (100 mg total) by mouth 2 (two) times daily for 10 days. 1 po bid  Need for immunization against influenza -     Flu Vaccine QUAD 36+ mos IM  Screening for colorectal cancer -     Cologuard  Pt to follow up with neurology. Office has contacted pt to schedule appointment, pt will make appointment. Pt to get  eye exam.    Total time spent with patient 40 minutes.  Greater than 50% of encounter spent in coordination of care/counseling.  Continue all other maintenance medications.  Follow up plan: Return in about 2 months (around 03/29/2019), or if symptoms worsen or fail to improve, for DM.  Continue healthy lifestyle choices, including diet (rich in fruits, vegetables, and lean proteins, and low in salt and simple carbohydrates) and exercise (at least 30 minutes of moderate physical activity daily).  Educational handout given for DM  The above assessment and management plan was discussed with the patient. The patient verbalized understanding of and has agreed to the management plan. Patient is aware to call the clinic if they develop any new symptoms or if symptoms persist or worsen. Patient is aware when to return to the clinic for a follow-up visit. Patient educated on when it is appropriate to go to the emergency department.   Monia Pouch, FNP-C [facility] [phone]

## 2019-01-16 NOTE — Patient Instructions (Addendum)
Need eye exam Need to follow up with neurology. Continue 70/30, stop lantus. Cologuard ordered.  Doxycycline if hand worsens.   Continue to monitor your blood sugars as we discussed and record them. Bring the log to your next appointment.  Take your medications as directed.    Goal Blood glucose:    Fasting (before meals) = 80 to 130   Within 2 hours of eating = less than 180   Understanding your Hemoglobin A1c:     Diabetes Mellitus and Nutrition    I think that you would greatly benefit from seeing a nutritionist. If this is something you are interested in, please call Dr Jenne Campus at [phone] to schedule an appointment.   When you have diabetes (diabetes mellitus), it is very important to have healthy eating habits because your blood sugar (glucose) levels are greatly affected by what you eat and drink. Eating healthy foods in the appropriate amounts, at about the same times every day, can help you:  Control your blood glucose.  Lower your risk of heart disease.  Improve your blood pressure.  Reach or maintain a healthy weight.  Every person with diabetes is different, and each person has different needs for a meal plan. Your health care provider may recommend that you work with a diet and nutrition specialist (dietitian) to make a meal plan that is best for you. Your meal plan may vary depending on factors such as:  The calories you need.  The medicines you take.  Your weight.  Your blood glucose, blood pressure, and cholesterol levels.  Your activity level.  Other health conditions you have, such as heart or kidney disease.  How do carbohydrates affect me? Carbohydrates affect your blood glucose level more than any other type of food. Eating carbohydrates naturally increases the amount of glucose in your blood. Carbohydrate counting is a method for keeping track of how many carbohydrates you eat. Counting carbohydrates is important to keep your blood glucose at  a healthy level, especially if you use insulin or take certain oral diabetes medicines. It is important to know how many carbohydrates you can safely have in each meal. This is different for every person. Your dietitian can help you calculate how many carbohydrates you should have at each meal and for snack. Foods that contain carbohydrates include:  Bread, cereal, rice, pasta, and crackers.  Potatoes and corn.  Peas, beans, and lentils.  Milk and yogurt.  Fruit and juice.  Desserts, such as cakes, cookies, ice cream, and candy.  How does alcohol affect me? Alcohol can cause a sudden decrease in blood glucose (hypoglycemia), especially if you use insulin or take certain oral diabetes medicines. Hypoglycemia can be a life-threatening condition. Symptoms of hypoglycemia (sleepiness, dizziness, and confusion) are similar to symptoms of having too much alcohol. If your health care provider says that alcohol is safe for you, follow these guidelines:  Limit alcohol intake to no more than 1 drink per day for nonpregnant women and 2 drinks per day for men. One drink equals 12 oz of beer, 5 oz of wine, or 1 oz of hard liquor.  Do not drink on an empty stomach.  Keep yourself hydrated with water, diet soda, or unsweetened iced tea.  Keep in mind that regular soda, juice, and other mixers may contain a lot of sugar and must be counted as carbohydrates.  What are tips for following this plan?  Reading food labels  Start by checking the serving size on the label. The  amount of calories, carbohydrates, fats, and other nutrients listed on the label are based on one serving of the food. Many foods contain more than one serving per package.  Check the total grams (g) of carbohydrates in one serving. You can calculate the number of servings of carbohydrates in one serving by dividing the total carbohydrates by 15. For example, if a food has 30 g of total carbohydrates, it would be equal to 2  servings of carbohydrates.  Check the number of grams (g) of saturated and trans fats in one serving. Choose foods that have low or no amount of these fats.  Check the number of milligrams (mg) of sodium in one serving. Most people should limit total sodium intake to less than 2,300 mg per day.  Always check the nutrition information of foods labeled as "low-fat" or "nonfat". These foods may be higher in added sugar or refined carbohydrates and should be avoided.  Talk to your dietitian to identify your daily goals for nutrients listed on the label.  Shopping  Avoid buying canned, premade, or processed foods. These foods tend to be high in fat, sodium, and added sugar.  Shop around the outside edge of the grocery store. This includes fresh fruits and vegetables, bulk grains, fresh meats, and fresh dairy.  Cooking  Use low-heat cooking methods, such as baking, instead of high-heat cooking methods like deep frying.  Cook using healthy oils, such as olive, canola, or sunflower oil.  Avoid cooking with butter, cream, or high-fat meats.  Meal planning  Eat meals and snacks regularly, preferably at the same times every day. Avoid going long periods of time without eating.  Eat foods high in fiber, such as fresh fruits, vegetables, beans, and whole grains. Talk to your dietitian about how many servings of carbohydrates you can eat at each meal.  Eat 4-6 ounces of lean protein each day, such as lean meat, chicken, fish, eggs, or tofu. 1 ounce is equal to 1 ounce of meat, chicken, or fish, 1 egg, or 1/4 cup of tofu.  Eat some foods each day that contain healthy fats, such as avocado, nuts, seeds, and fish.  Lifestyle   Check your blood glucose regularly.  Exercise at least 30 minutes 5 or more days each week, or as told by your health care provider.  Take medicines as told by your health care provider.  Do not use any products that contain nicotine or tobacco, such as cigarettes and  e-cigarettes. If you need help quitting, ask your health care provider.  Work with a Social worker or diabetes educator to identify strategies to manage stress and any emotional and social challenges.   What are some questions to ask my health care provider?  Do I need to meet with a diabetes educator?  Do I need to meet with a dietitian?  What number can I call if I have questions?  When are the best times to check my blood glucose?   Where to find more information:  American Diabetes Association: diabetes.org/food-and-fitness/food  Academy of Nutrition and Dietetics: PokerClues.dk  [employer] of Diabetes and Digestive and Kidney Diseases (NIH): ContactWire.be   Summary  A healthy meal plan will help you control your blood glucose and maintain a healthy lifestyle.  Working with a diet and nutrition specialist (dietitian) can help you make a meal plan that is best for you.  Keep in mind that carbohydrates and alcohol have immediate effects on your blood glucose levels. It is important to count  carbohydrates and to use alcohol carefully. This information is not intended to replace advice given to you by your health care provider. Make sure you discuss any questions you have with your health care provider. Document Released: 10/07/2004 Document Revised: 02/15/2016 Document Reviewed: 02/15/2016 Elsevier Interactive Patient Education  [employer].

## 2019-01-19 ENCOUNTER — Other Ambulatory Visit: Payer: Self-pay | Admitting: Physician Assistant

## 2019-01-22 ENCOUNTER — Telehealth: Payer: Self-pay | Admitting: Family Medicine

## 2019-01-22 MED ORDER — GLUCOSE BLOOD VI STRP: ORAL_STRIP | 12 refills | Status: AC

## 2019-01-22 MED ORDER — ATORVASTATIN CALCIUM 40 MG PO TABS: 40.0000 mg | ORAL_TABLET | Freq: Every day | ORAL | 3 refills | Status: DC

## 2019-01-22 NOTE — Telephone Encounter (Signed)
Sent in

## 2019-01-22 NOTE — Telephone Encounter (Signed)
What is the name of the medication? citalopram (CELEXA) 20 MG tablet accu check guide test stips---send this one to [facility]  Have you contacted your pharmacy to request a refill? No  Which pharmacy would you like this sent to? [facility].   Patient notified that their request is being sent to the clinical staff for review and that they should receive a call once it is complete. If they do not receive a call within 24 hours they can check with their pharmacy or our office.

## 2019-01-23 ENCOUNTER — Ambulatory Visit: Payer: Self-pay | Admitting: Family Medicine

## 2019-01-25 ENCOUNTER — Other Ambulatory Visit: Payer: Self-pay | Admitting: Physician Assistant

## 2019-01-28 ENCOUNTER — Other Ambulatory Visit: Payer: Self-pay | Admitting: Physician Assistant

## 2019-01-30 ENCOUNTER — Telehealth: Payer: Self-pay | Admitting: Family Medicine

## 2019-01-30 ENCOUNTER — Other Ambulatory Visit: Payer: Self-pay | Admitting: Family Medicine

## 2019-01-30 DIAGNOSIS — G2581 Restless legs syndrome: Secondary | ICD-10-CM

## 2019-01-30 DIAGNOSIS — I69320 Aphasia following cerebral infarction: Secondary | ICD-10-CM

## 2019-01-30 DIAGNOSIS — F339 Major depressive disorder, recurrent, unspecified: Secondary | ICD-10-CM

## 2019-01-30 DIAGNOSIS — E119 Type 2 diabetes mellitus without complications: Secondary | ICD-10-CM

## 2019-01-30 MED ORDER — GABAPENTIN 300 MG PO CAPS: 300.0000 mg | ORAL_CAPSULE | Freq: Three times a day (TID) | ORAL | 0 refills | Status: DC

## 2019-01-30 MED ORDER — CITALOPRAM HYDROBROMIDE 20 MG PO TABS: 20.0000 mg | ORAL_TABLET | Freq: Every day | ORAL | 0 refills | Status: DC

## 2019-01-30 MED ORDER — METFORMIN HCL 1000 MG PO TABS: 1000.0000 mg | ORAL_TABLET | Freq: Two times a day (BID) | ORAL | 1 refills | Status: DC

## 2019-01-30 NOTE — Addendum Note (Signed)
Addended by: Baruch Gouty on: 01/30/2019 06:47 PM   Modules accepted: Orders

## 2019-01-30 NOTE — Telephone Encounter (Signed)
What is the name of the medication? citalopram (CELEXA) 20 MG tablet, metFORMIN. gabapentin (NEURONTIN) 300 MG capsule (GLUCOPHAGE) 1000 MG tablet  Have you contacted your pharmacy to request a refill? YES AND THEY SAID DR HAS TO CALL IT IN  Which pharmacy would you like this sent to? [facility]   Patient notified that their request is being sent to the clinical staff for review and that they should receive a call once it is complete. If they do not receive a call within 24 hours they can check with their pharmacy or our office.

## 2019-01-30 NOTE — Telephone Encounter (Signed)
Medications refilled, sent to [facility]

## 2019-01-30 NOTE — Telephone Encounter (Signed)
Referral placed.

## 2019-01-30 NOTE — Addendum Note (Signed)
Addended by: Antonietta Barcelona D on: 01/30/2019 04:48 PM   Modules accepted: Orders

## 2019-01-31 NOTE — Telephone Encounter (Signed)
Pt aware of referral being placed.

## 2019-02-06 ENCOUNTER — Other Ambulatory Visit: Payer: Self-pay | Admitting: Physician Assistant

## 2019-02-10 ENCOUNTER — Other Ambulatory Visit: Payer: Self-pay | Admitting: Physician Assistant

## 2019-02-13 ENCOUNTER — Other Ambulatory Visit: Payer: Self-pay | Admitting: *Deleted

## 2019-02-13 MED ORDER — LISINOPRIL 5 MG PO TABS: 5.0000 mg | ORAL_TABLET | Freq: Every day | ORAL | 0 refills | Status: DC

## 2019-02-23 ENCOUNTER — Other Ambulatory Visit: Payer: Self-pay | Admitting: Physician Assistant

## 2019-02-27 ENCOUNTER — Other Ambulatory Visit: Payer: Self-pay | Admitting: Physician Assistant

## 2019-02-28 ENCOUNTER — Other Ambulatory Visit: Payer: Self-pay | Admitting: Family Medicine

## 2019-02-28 ENCOUNTER — Telehealth: Payer: Self-pay | Admitting: *Deleted

## 2019-02-28 DIAGNOSIS — Z794 Long term (current) use of insulin: Secondary | ICD-10-CM

## 2019-02-28 DIAGNOSIS — E119 Type 2 diabetes mellitus without complications: Secondary | ICD-10-CM

## 2019-02-28 MED ORDER — INSULIN ISOPHANE & REGULAR (HUMAN 70-30)100 UNIT/ML KWIKPEN: 40.0000 [IU] | PEN_INJECTOR | Freq: Two times a day (BID) | SUBCUTANEOUS | 11 refills | Status: DC

## 2019-02-28 NOTE — Telephone Encounter (Signed)
RX changed 

## 2019-02-28 NOTE — Telephone Encounter (Signed)
Novolin 70/30 Vial INJ-Non Preferred by Medicaid  Preferred is Humulin 70/30 Kwikpen/Vial

## 2019-03-01 NOTE — Telephone Encounter (Signed)
Pt aware.

## 2019-03-02 ENCOUNTER — Other Ambulatory Visit: Payer: Self-pay | Admitting: Family Medicine

## 2019-03-02 DIAGNOSIS — G2581 Restless legs syndrome: Secondary | ICD-10-CM

## 2019-03-09 ENCOUNTER — Other Ambulatory Visit: Payer: Self-pay | Admitting: Family Medicine

## 2019-03-09 DIAGNOSIS — G2581 Restless legs syndrome: Secondary | ICD-10-CM

## 2019-03-09 LAB — COLOGUARD

## 2019-03-13 ENCOUNTER — Other Ambulatory Visit: Payer: Self-pay | Admitting: Physician Assistant

## 2019-03-18 ENCOUNTER — Telehealth: Payer: Self-pay | Admitting: Family Medicine

## 2019-03-18 MED ORDER — FUROSEMIDE 20 MG PO TABS: 20.0000 mg | ORAL_TABLET | Freq: Every day | ORAL | 2 refills | Status: DC

## 2019-03-18 NOTE — Telephone Encounter (Signed)
What is the name of the medication? Furosemide  Have you contacted your pharmacy to request a refill? Yes  Which pharmacy would you like this sent to? [facility]   Patient notified that their request is being sent to the clinical staff for review and that they should receive a call once it is complete. If they do not receive a call within 24 hours they can check with their pharmacy or our office.   Rakes' pt.  Please call pt.  She has been waiting for 3 weeks now for [facility] to send it to [place].  Diabetes meds--needs something cheaper.

## 2019-03-18 NOTE — Telephone Encounter (Signed)
Pt aware refill sent to pharmacy 

## 2019-03-29 ENCOUNTER — Other Ambulatory Visit: Payer: Self-pay

## 2019-04-01 ENCOUNTER — Ambulatory Visit (INDEPENDENT_AMBULATORY_CARE_PROVIDER_SITE_OTHER): Payer: Medicaid Other | Admitting: Family Medicine

## 2019-04-01 ENCOUNTER — Encounter: Payer: Self-pay | Admitting: Family Medicine

## 2019-04-01 ENCOUNTER — Other Ambulatory Visit: Payer: Self-pay

## 2019-04-01 VITALS — BP 138/78 | HR 86 | Temp 96.8°F | Resp 20 | Ht 62.0 in | Wt 230.0 lb

## 2019-04-01 DIAGNOSIS — I10 Essential (primary) hypertension: Secondary | ICD-10-CM

## 2019-04-01 DIAGNOSIS — E785 Hyperlipidemia, unspecified: Secondary | ICD-10-CM | POA: Diagnosis not present

## 2019-04-01 DIAGNOSIS — E1159 Type 2 diabetes mellitus with other circulatory complications: Secondary | ICD-10-CM

## 2019-04-01 DIAGNOSIS — G2581 Restless legs syndrome: Secondary | ICD-10-CM

## 2019-04-01 DIAGNOSIS — F339 Major depressive disorder, recurrent, unspecified: Secondary | ICD-10-CM

## 2019-04-01 DIAGNOSIS — E1169 Type 2 diabetes mellitus with other specified complication: Secondary | ICD-10-CM | POA: Diagnosis not present

## 2019-04-01 DIAGNOSIS — Z794 Long term (current) use of insulin: Secondary | ICD-10-CM

## 2019-04-01 DIAGNOSIS — I152 Hypertension secondary to endocrine disorders: Secondary | ICD-10-CM

## 2019-04-01 DIAGNOSIS — K219 Gastro-esophageal reflux disease without esophagitis: Secondary | ICD-10-CM

## 2019-04-01 DIAGNOSIS — M5442 Lumbago with sciatica, left side: Secondary | ICD-10-CM | POA: Diagnosis not present

## 2019-04-01 DIAGNOSIS — E119 Type 2 diabetes mellitus without complications: Secondary | ICD-10-CM

## 2019-04-01 LAB — BAYER DCA HB A1C WAIVED: HB A1C (BAYER DCA - WAIVED): 7.7 % — ABNORMAL HIGH (ref <7.0)

## 2019-04-01 MED ORDER — OMEPRAZOLE 20 MG PO CPDR: 20.0000 mg | DELAYED_RELEASE_CAPSULE | Freq: Every day | ORAL | 1 refills | Status: DC

## 2019-04-01 MED ORDER — CITALOPRAM HYDROBROMIDE 20 MG PO TABS: 20.0000 mg | ORAL_TABLET | Freq: Every day | ORAL | 0 refills | Status: DC

## 2019-04-01 MED ORDER — LEVETIRACETAM 500 MG PO TABS: 500.0000 mg | ORAL_TABLET | Freq: Two times a day (BID) | ORAL | 2 refills | Status: DC

## 2019-04-01 MED ORDER — KETOROLAC TROMETHAMINE 30 MG/ML IJ SOLN
30.0000 mg | Freq: Once | INTRAMUSCULAR | Status: AC
  Administered 2019-04-01: 30 mg via INTRAMUSCULAR

## 2019-04-01 MED ORDER — FUROSEMIDE 40 MG PO TABS: 40.0000 mg | ORAL_TABLET | Freq: Every day | ORAL | 3 refills | Status: DC

## 2019-04-01 MED ORDER — INSULIN ISOPHANE & REGULAR (HUMAN 70-30)100 UNIT/ML KWIKPEN: 50.0000 [IU] | PEN_INJECTOR | Freq: Two times a day (BID) | SUBCUTANEOUS | 11 refills | Status: DC

## 2019-04-01 MED ORDER — GABAPENTIN 300 MG PO CAPS: 300.0000 mg | ORAL_CAPSULE | Freq: Three times a day (TID) | ORAL | 0 refills | Status: DC

## 2019-04-01 MED ORDER — METHYLPREDNISOLONE ACETATE 40 MG/ML IJ SUSP
40.0000 mg | Freq: Once | INTRAMUSCULAR | Status: AC
  Administered 2019-04-01: 40 mg via INTRAMUSCULAR

## 2019-04-01 MED ORDER — LISINOPRIL 5 MG PO TABS: 5.0000 mg | ORAL_TABLET | Freq: Every day | ORAL | 0 refills | Status: DC

## 2019-04-01 MED ORDER — ROPINIROLE HCL 1 MG PO TABS: 1.0000 mg | ORAL_TABLET | Freq: Every day | ORAL | 0 refills | Status: DC

## 2019-04-01 NOTE — Progress Notes (Signed)
Subjective:  Patient ID: Tracy Farrell, female    DOB: 1955-05-05, 64 y.o.   MRN: [id number]  Patient Care Team: Baruch Gouty, FNP as PCP - General (Family Medicine)   Chief Complaint:  Medical Management of Chronic Issues (3 mo ), Diabetes, Hyperlipidemia, Gastroesophageal Reflux, and Hypertension   HPI: Tracy Farrell is a 64 y.o. female presenting on 04/01/2019 for Medical Management of Chronic Issues (3 mo ), Diabetes, Hyperlipidemia, Gastroesophageal Reflux, and Hypertension   1. Type 2 diabetes mellitus without complication, with long-term current use of insulin (HCC) Currently on metformin 1051m bid and humulin 70/30 40 units. She checks her Blood sugar BID. Blood sugars on home log range from mid 100s to mid 200s. She reports that she eats a diet high in vegetables and occasionally fish. She reports good medication compliance and denies medication side effects.   Had diabetic eye exam with Dr. LTruman Hayward   3. Hyperlipidemia associated with type 2 diabetes mellitus ([place] Compliant with medications - Yes Current medications - lipitor 460mSide effects from medications -no, denies myalgias  Diet - high in vegetables, low in fried, fatty foods       Exercise - does not exercise  Lab Results  Component Value Date   CHOL 124 12/27/2018   HDL 54 12/27/2018   LDLCALC 55 12/27/2018   TRIG 77 12/27/2018   CHOLHDL 2.3 12/27/2018     Family and personal medical history reviewed. Smoking and ETOH history reviewed.   2. Hypertension associated with type 2 diabetes mellitus (HCTunnel CityComplaint with meds - yes Current Medications - lisinopril and losartan  Checking BP at home ranging 106/63 to 160/85 with most readings around 130s/70s Exercising Regularly - No Watching Salt intake - Yes Pertinent ROS:  Headache - No Fatigue - Yes Visual Disturbances - No Chest pain - No Dyspnea - No Palpitations - No LE edema - Yes She reports good compliance with medications and can restate  their regimen by memory. No medication side effects.  Family, social, and smoking history reviewed.   BP Readings from Last 3 Encounters:  04/01/19 138/78  01/16/19 130/74  12/26/18 (!) 172/80   CMP Latest Ref Rng & Units 12/27/2018 10/23/2018 10/08/2018  Glucose 70 - 99 mg/dL 132(H) 302(H) 271(H)  BUN 8 - 23 mg/dL _0 Creatinine 0.44 - 1.00 mg/dL 1.10(H) 0.89 0.77  Sodium 135 - 145 mmol/L 137 135 132(L)  Potassium 3.5 - 5.1 mmol/L 4.0 4.3 4.2  Chloride 98 - 111 mmol/L 101 98 100  CO2 22 - 32 mmol/L _1 Calcium 8.9 - 10.3 mg/dL 9.0 8.9 8.5(L)  Total Protein 6.5 - 8.1 g/dL 7.0 6.2 5.7(L)  Total Bilirubin 0.3 - 1.2 mg/dL 0.7 0.3 0.7  Alkaline Phos 38 - 126 U/L 73 83 67  AST 15 - 41 U/L 11(L) 7 9(L)  ALT 0 - 44 U/L _2 4. Morbid obesity (HCWebsterDoes not exercise, reports she does try to watch what she eats. Avoids carbs and and sweets, tries to eat more vegetables.  Up 12 pounds since visit in December.    5. Lower extremity edema She reports bilateral lower extremity edema for a while that comes and goes. She also endorses soreness to both legs as well. She denies any shortness of breath, chest pain, cough, orthopnea or decrease in urination.    6. Recurrent Falls/Loss of balance Reports increase in falls recently that  have been worsening since her CVA. She reports she was never able to follow up with Neurology because they would not accept her insurance. States that she loses her balance frequently. She denies dizziness or lightheadness.    7. [name] without esophagitis Currently on omeprazole 56m daily. Reports some occasional acid reflux which she feels may be related to what she eats but otherwise doing well. Denies vomiting, diarrhea, sob, cough or dysphagia.   8. Left Lower back pain with Left side sciatica Reports pain x3 days after she fell. Continued pain that radiates down her left leg.    Relevant past medical, surgical, family, and social history  reviewed and updated as indicated.  Allergies and medications reviewed and updated. Date reviewed: Chart in Epic.   Past Medical History:  Diagnosis Date  . Arthritis   . Carpal tunnel syndrome, bilateral   . Chronic back pain   . Depression   . Diabetes mellitus   . GERD (gastroesophageal reflux disease)   . Headache(784.0)   . Hyperlipemia   . Hypertension   . RLS (restless legs syndrome)   . Stroke ([facility]     Past Surgical History:  Procedure Laterality Date  . ABDOMINAL HYSTERECTOMY    . CARPAL TUNNEL RELEASE     bilateral   . CARPAL TUNNEL RELEASE  08/17/2011   Procedure: CARPAL TUNNEL RELEASE;  Surgeon: GWynonia Sours MD;  [facility]  Service: Orthopedics;  Laterality: Right;  re-release carpal canal hypothenar fat pad transfer  . KNEE SURGERY     right   . LUMBAR EPIDURAL INJECTION      Social History   Socioeconomic History  . Marital status: Married    Spouse name: BAbe People . Number of children: 2  . Years of education: Not on file  . Highest education level: Not on file  Occupational History  . Not on file  Tobacco Use  . Smoking status: Never Smoker  . Smokeless tobacco: Never Used  Substance and Sexual Activity  . Alcohol use: No  . Drug use: Never  . Sexual activity: Never  Other Topics Concern  . Not on file  Social History Narrative   Lives with husband , daughter passed away 203/05/2018  Social Determinants of Health   Financial Resource Strain:   . Difficulty of Paying Living Expenses: Not on file  Food Insecurity:   . Worried About RCharity fundraiserin the Last Year: Not on file  . Ran Out of Food in the Last Year: Not on file  Transportation Needs:   . Lack of Transportation (Medical): Not on file  . Lack of Transportation (Non-Medical): Not on file  Physical Activity:   . Days of Exercise per Week: Not on file  . Minutes of Exercise per Session: Not on file  Stress:   . Feeling of Stress : Not on file  Social  Connections:   . Frequency of Communication with Friends and Family: Not on file  . Frequency of Social Gatherings with Friends and Family: Not on file  . Attends Religious Services: Not on file  . Active Member of Clubs or Organizations: Not on file  . Attends CArchivistMeetings: Not on file  . Marital Status: Not on file  Intimate Partner Violence:   . Fear of Current or Ex-Partner: Not on file  . Emotionally Abused: Not on file  . Physically Abused: Not on file  . Sexually Abused: Not on file  Outpatient Encounter Medications as of 04/01/2019  Medication Sig  . aspirin EC 81 MG tablet Take 1 tablet (81 mg total) by mouth daily.  Marland Kitchen atorvastatin (LIPITOR) 40 MG tablet Take 1 tablet (40 mg total) by mouth daily at 6 PM.  . citalopram (CELEXA) 20 MG tablet Take 1 tablet (20 mg total) by mouth daily.  . furosemide (LASIX) 20 MG tablet Take 1 tablet (20 mg total) by mouth daily.  Marland Kitchen gabapentin (NEURONTIN) 300 MG capsule TAKE 1 CAPSULE BY MOUTH THREE TIMES DAILY  . glucose blood test strip Use as instructed  . Insulin Isophane & Regular Human (HUMULIN 70/30 MIX) (70-30) 100 UNIT/ML PEN Inject 40 Units into the skin 2 (two) times daily with a meal.  . lisinopril (ZESTRIL) 5 MG tablet Take 1 tablet (5 mg total) by mouth daily.  Marland Kitchen losartan (COZAAR) 25 MG tablet Take 1 tablet by mouth once daily  . metFORMIN (GLUCOPHAGE) 1000 MG tablet Take 1 tablet (1,000 mg total) by mouth 2 (two) times daily with a meal.  . omeprazole (PRILOSEC) 20 MG capsule Take 1 capsule (20 mg total) by mouth daily.  Marland Kitchen rOPINIRole (REQUIP) 1 MG tablet TAKE 1 TABLET BY MOUTH AT BEDTIME  . levETIRAcetam (KEPPRA) 500 MG tablet Take 1 tablet (500 mg total) by mouth 2 (two) times daily.  . mupirocin ointment (BACTROBAN) 2 % Place 1 application into the nose 2 (two) times daily. (Patient not taking: Reported on 04/01/2019)   No facility-administered encounter medications on file as of 04/01/2019.    Allergies    Allergen Reactions  . Sulfa Antibiotics Other (See Comments)    UNKNOWN REACTION    Review of Systems  Constitutional: Negative for activity change and unexpected weight change.  HENT: Negative.   Eyes: Negative.   Respiratory: Negative for chest tightness and shortness of breath.   Cardiovascular: Positive for leg swelling. Negative for chest pain and palpitations.  Gastrointestinal: Negative for abdominal pain, constipation, diarrhea and nausea.  Endocrine: Negative for cold intolerance, heat intolerance, polydipsia and polyphagia.  Genitourinary: Negative for difficulty urinating, frequency and urgency.  Musculoskeletal: Positive for back pain. Negative for gait problem and myalgias.  Skin: Negative for color change.  Allergic/Immunologic: Negative.   Neurological: Negative for dizziness, syncope, weakness, light-headedness, numbness and headaches.  Hematological: Negative.   Psychiatric/Behavioral: Negative for behavioral problems and sleep disturbance. The patient is not nervous/anxious.   All other systems reviewed and are negative.       Objective:  BP 138/78   Pulse 86   Temp (!) 96.8 F (36 C)   Resp 20   Ht _0  (1.575 m)   Wt 230 lb (104.3 kg)   SpO2 99%   BMI 42.07 kg/m    Wt Readings from Last 3 Encounters:  04/01/19 230 lb (104.3 kg)  01/16/19 218 lb (98.9 kg)  10/23/18 202 lb (91.6 kg)    Physical Exam Vitals and nursing note reviewed.  Constitutional:      Appearance: Normal appearance. She is normal weight.  HENT:     Head: Normocephalic and atraumatic.     Nose: Nose normal.     Mouth/Throat:     Mouth: Mucous membranes are moist.  Eyes:     Pupils: Pupils are equal, round, and reactive to light.  Cardiovascular:     Rate and Rhythm: Normal rate and regular rhythm.     Pulses: Normal pulses.     Heart sounds: Normal heart sounds.  Pulmonary:  Effort: Pulmonary effort is normal.     Breath sounds: Normal breath sounds.  Abdominal:      General: Abdomen is flat. Bowel sounds are normal. There is no distension.     Palpations: Abdomen is soft.  Musculoskeletal:     Cervical back: Normal range of motion and neck supple.     Right lower leg: Edema present.     Left lower leg: Edema present.  Skin:    General: Skin is warm and dry.     Capillary Refill: Capillary refill takes less than 2 seconds.  Neurological:     General: No focal deficit present.     Mental Status: She is alert and oriented to person, place, and time. Mental status is at baseline.     Cranial Nerves: No cranial nerve deficit.     Sensory: Sensory deficit present.     Motor: No weakness.     Coordination: Coordination normal.     Gait: Gait normal.  Psychiatric:        Mood and Affect: Mood normal.        Behavior: Behavior normal.        Thought Content: Thought content normal.        Judgment: Judgment normal.     Results for orders placed or performed during the hospital encounter of 12/27/18  Lipid panel  Result Value Ref Range   Cholesterol 124 0 - 200 mg/dL   Triglycerides 77 <150 mg/dL   HDL 54 >40 mg/dL   Total CHOL/HDL Ratio 2.3 RATIO   VLDL 15 0 - 40 mg/dL   LDL Cholesterol 55 0 - 99 mg/dL  Comprehensive metabolic panel  Result Value Ref Range   Sodium 137 135 - 145 mmol/L   Potassium 4.0 3.5 - 5.1 mmol/L   Chloride 101 98 - 111 mmol/L   CO2 25 22 - 32 mmol/L   Glucose, Bld 132 (H) 70 - 99 mg/dL   BUN 19 8 - 23 mg/dL   Creatinine, Ser 1.10 (H) 0.44 - 1.00 mg/dL   Calcium 9.0 8.9 - 10.3 mg/dL   Total Protein 7.0 6.5 - 8.1 g/dL   Albumin 3.6 3.5 - 5.0 g/dL   AST 11 (L) 15 - 41 U/L   ALT 12 0 - 44 U/L   Alkaline Phosphatase 73 38 - 126 U/L   Total Bilirubin 0.7 0.3 - 1.2 mg/dL   GFR calc non Af Amer 53 (L) >60 mL/min   GFR calc Af Amer >60 >60 mL/min   Anion gap 11 5 - 15  Hemoglobin A1c  Result Value Ref Range   Hgb A1c MFr Bld 8.4 (H) 4.8 - 5.6 %   Mean Plasma Glucose 194.38 mg/dL       Pertinent labs & imaging  results that were available during my care of the patient were reviewed by me and considered in my medical decision making.  Assessment & Plan:  Maci was seen today for medical management of chronic issues, diabetes, hyperlipidemia, gastroesophageal reflux and hypertension.  Diagnoses and all orders for this visit:  Type 2 diabetes mellitus without complication, with long-term current use of insulin (HCC) -     CBC with Differential/Platelet -     Bayer DCA Hb A1c Waived A1C 7.7 today.  Continue metformin 1042m BID with good result.   increase humulin 70/30 to 50 units BID    Hypertension associated with type 2 diabetes mellitus (HCC) -     CBC with Differential/Platelet -  CMP14+EGFR Continue current medication regimen with good result  Hyperlipidemia associated with type 2 diabetes mellitus ([place]) -     CBC with Differential/Platelet -     Lipid panel Continue current medication regimen with good result. Discussed adding daily exercise as tolerated.   Morbid obesity ([place]) Discussed continuing diet high in vegetables, low in fatty, greasy, fried foods and adding daily exercise as tolerated.   Lower extremity edema increase lasix from 64m/daily to 453mdaily.   Left Lower back pain with Left side sciatica IM injection of Toradol and Depomedrol  given in office.   Continue all other maintenance medications.  Follow up plan: Return in about 3 months (around 07/02/2019), or if symptoms worsen or fail to improve.  Continue healthy lifestyle choices, including diet (rich in fruits, vegetables, and lean proteins, and low in salt and simple carbohydrates) and exercise (at least 30 minutes of moderate physical activity daily).    The above assessment and management plan was discussed with the patient. The patient verbalized understanding of and has agreed to the management plan. Patient is aware to call the clinic if they develop any new symptoms or if symptoms persist or  worsen. Patient is aware when to return to the clinic for a follow-up visit. Patient educated on when it is appropriate to go to the emergency department.    ChScherrie GerlachBSN, RN, AGNP-Student   I personally was present during the history, physical exam, and medical decision-making activities of this service and have verified that the service and findings are accurately documented in the nurse practitioner student's note.  MiMonia PouchFNP-C [facility] [address] [place] [phone]

## 2019-04-01 NOTE — Addendum Note (Signed)
Addended by: Zannie Cove on: 04/01/2019 02:37 PM   Modules accepted: Orders

## 2019-04-01 NOTE — Patient Instructions (Signed)

## 2019-04-02 LAB — LIPID PANEL
Chol/HDL Ratio: 2.8 ratio (ref 0.0–4.4)
Cholesterol, Total: 142 mg/dL (ref 100–199)
HDL: 50 mg/dL (ref >39)
LDL Chol Calc (NIH): 63 mg/dL (ref 0–99)
Triglycerides: 175 mg/dL — ABNORMAL HIGH (ref 0–149)
VLDL Cholesterol Cal: 29 mg/dL (ref 5–40)

## 2019-04-02 LAB — CMP14+EGFR
ALT: 12 IU/L (ref 0–32)
AST: 13 IU/L (ref 0–40)
Albumin/Globulin Ratio: 1.8 (ref 1.2–2.2)
Albumin: 4.4 g/dL (ref 3.8–4.8)
Alkaline Phosphatase: 94 IU/L (ref 39–117)
BUN/Creatinine Ratio: 14 (ref 12–28)
BUN: 23 mg/dL (ref 8–27)
Bilirubin Total: <0.2 mg/dL (ref 0.0–1.2)
CO2: 25 mmol/L (ref 20–29)
Calcium: 9.9 mg/dL (ref 8.7–10.3)
Chloride: 95 mmol/L — ABNORMAL LOW (ref 96–106)
Creatinine, Ser: 1.64 mg/dL — ABNORMAL HIGH (ref 0.57–1.00)
GFR calc Af Amer: 38 mL/min/{1.73_m2} — ABNORMAL LOW (ref >59)
GFR calc non Af Amer: 33 mL/min/{1.73_m2} — ABNORMAL LOW (ref >59)
Globulin, Total: 2.5 g/dL (ref 1.5–4.5)
Glucose: 148 mg/dL — ABNORMAL HIGH (ref 65–99)
Potassium: 4.8 mmol/L (ref 3.5–5.2)
Sodium: 137 mmol/L (ref 134–144)
Total Protein: 6.9 g/dL (ref 6.0–8.5)

## 2019-04-02 LAB — CBC WITH DIFFERENTIAL/PLATELET
Basophils Absolute: 0 10*3/uL (ref 0.0–0.2)
Basos: 1 %
EOS (ABSOLUTE): 0.3 10*3/uL (ref 0.0–0.4)
Eos: 3 %
Hematocrit: 34.7 % (ref 34.0–46.6)
Hemoglobin: 11.2 g/dL (ref 11.1–15.9)
Immature Grans (Abs): 0 10*3/uL (ref 0.0–0.1)
Immature Granulocytes: 0 %
Lymphocytes Absolute: 2 10*3/uL (ref 0.7–3.1)
Lymphs: 25 %
MCH: 26.7 pg (ref 26.6–33.0)
MCHC: 32.3 g/dL (ref 31.5–35.7)
MCV: 83 fL (ref 79–97)
Monocytes Absolute: 0.5 10*3/uL (ref 0.1–0.9)
Monocytes: 7 %
Neutrophils Absolute: 5.2 10*3/uL (ref 1.4–7.0)
Neutrophils: 64 %
Platelets: 340 10*3/uL (ref 150–450)
RBC: 4.19 x10E6/uL (ref 3.77–5.28)
RDW: 14.9 % (ref 11.7–15.4)
WBC: 8 10*3/uL (ref 3.4–10.8)

## 2019-04-08 ENCOUNTER — Other Ambulatory Visit: Payer: Self-pay | Admitting: Family Medicine

## 2019-04-08 ENCOUNTER — Telehealth: Payer: Self-pay | Admitting: Family Medicine

## 2019-04-08 DIAGNOSIS — Z794 Long term (current) use of insulin: Secondary | ICD-10-CM

## 2019-04-08 DIAGNOSIS — E119 Type 2 diabetes mellitus without complications: Secondary | ICD-10-CM

## 2019-04-08 MED ORDER — INSULIN PEN NEEDLE 32G X 4 MM MISC: 1.0000 "application " | Freq: Four times a day (QID) | 3 refills | Status: DC

## 2019-04-08 NOTE — Telephone Encounter (Signed)
Left message , needle script sent to pharmacy.   Please call our office for other questions or concerns.

## 2019-04-08 NOTE — Telephone Encounter (Signed)
RX sent

## 2019-04-13 ENCOUNTER — Other Ambulatory Visit: Payer: Self-pay | Admitting: Family Medicine

## 2019-04-13 DIAGNOSIS — F339 Major depressive disorder, recurrent, unspecified: Secondary | ICD-10-CM

## 2019-04-19 ENCOUNTER — Encounter: Payer: Self-pay | Admitting: *Deleted

## 2019-05-01 ENCOUNTER — Encounter: Payer: Self-pay | Admitting: *Deleted

## 2019-05-05 ENCOUNTER — Other Ambulatory Visit: Payer: Self-pay | Admitting: Family Medicine

## 2019-05-05 DIAGNOSIS — I152 Hypertension secondary to endocrine disorders: Secondary | ICD-10-CM

## 2019-05-05 DIAGNOSIS — E1159 Type 2 diabetes mellitus with other circulatory complications: Secondary | ICD-10-CM

## 2019-05-10 ENCOUNTER — Other Ambulatory Visit: Payer: Self-pay | Admitting: Family Medicine

## 2019-05-10 DIAGNOSIS — E1159 Type 2 diabetes mellitus with other circulatory complications: Secondary | ICD-10-CM

## 2019-05-27 ENCOUNTER — Other Ambulatory Visit: Payer: Self-pay | Admitting: *Deleted

## 2019-05-27 DIAGNOSIS — G2581 Restless legs syndrome: Secondary | ICD-10-CM

## 2019-05-27 MED ORDER — GABAPENTIN 300 MG PO CAPS: 300.0000 mg | ORAL_CAPSULE | Freq: Three times a day (TID) | ORAL | 1 refills | Status: DC

## 2019-05-28 ENCOUNTER — Other Ambulatory Visit: Payer: Self-pay | Admitting: *Deleted

## 2019-05-28 DIAGNOSIS — E1159 Type 2 diabetes mellitus with other circulatory complications: Secondary | ICD-10-CM

## 2019-05-28 DIAGNOSIS — I10 Essential (primary) hypertension: Secondary | ICD-10-CM

## 2019-05-28 MED ORDER — LOSARTAN POTASSIUM 25 MG PO TABS: 25.0000 mg | ORAL_TABLET | Freq: Every day | ORAL | 0 refills | Status: DC

## 2019-07-04 ENCOUNTER — Other Ambulatory Visit: Payer: Self-pay

## 2019-07-04 ENCOUNTER — Encounter: Payer: Self-pay | Admitting: Family Medicine

## 2019-07-04 ENCOUNTER — Ambulatory Visit (INDEPENDENT_AMBULATORY_CARE_PROVIDER_SITE_OTHER): Payer: Medicare Other | Admitting: Family Medicine

## 2019-07-04 VITALS — BP 135/84 | HR 81 | Temp 97.4°F | Ht 62.0 in | Wt 226.6 lb

## 2019-07-04 DIAGNOSIS — K219 Gastro-esophageal reflux disease without esophagitis: Secondary | ICD-10-CM

## 2019-07-04 DIAGNOSIS — I152 Hypertension secondary to endocrine disorders: Secondary | ICD-10-CM

## 2019-07-04 DIAGNOSIS — Z794 Long term (current) use of insulin: Secondary | ICD-10-CM

## 2019-07-04 DIAGNOSIS — F339 Major depressive disorder, recurrent, unspecified: Secondary | ICD-10-CM

## 2019-07-04 DIAGNOSIS — E1165 Type 2 diabetes mellitus with hyperglycemia: Secondary | ICD-10-CM | POA: Diagnosis not present

## 2019-07-04 DIAGNOSIS — Z23 Encounter for immunization: Secondary | ICD-10-CM | POA: Diagnosis not present

## 2019-07-04 DIAGNOSIS — Z87898 Personal history of other specified conditions: Secondary | ICD-10-CM

## 2019-07-04 DIAGNOSIS — E785 Hyperlipidemia, unspecified: Secondary | ICD-10-CM

## 2019-07-04 DIAGNOSIS — G2581 Restless legs syndrome: Secondary | ICD-10-CM

## 2019-07-04 DIAGNOSIS — I10 Essential (primary) hypertension: Secondary | ICD-10-CM

## 2019-07-04 DIAGNOSIS — E1159 Type 2 diabetes mellitus with other circulatory complications: Secondary | ICD-10-CM

## 2019-07-04 DIAGNOSIS — E1169 Type 2 diabetes mellitus with other specified complication: Secondary | ICD-10-CM | POA: Diagnosis not present

## 2019-07-04 DIAGNOSIS — Z780 Asymptomatic menopausal state: Secondary | ICD-10-CM

## 2019-07-04 DIAGNOSIS — Z1159 Encounter for screening for other viral diseases: Secondary | ICD-10-CM

## 2019-07-04 DIAGNOSIS — R252 Cramp and spasm: Secondary | ICD-10-CM

## 2019-07-04 DIAGNOSIS — Z8673 Personal history of transient ischemic attack (TIA), and cerebral infarction without residual deficits: Secondary | ICD-10-CM

## 2019-07-04 DIAGNOSIS — Z0000 Encounter for general adult medical examination without abnormal findings: Secondary | ICD-10-CM

## 2019-07-04 DIAGNOSIS — Z1211 Encounter for screening for malignant neoplasm of colon: Secondary | ICD-10-CM

## 2019-07-04 LAB — BAYER DCA HB A1C WAIVED: HB A1C (BAYER DCA - WAIVED): 9.2 % — ABNORMAL HIGH (ref <7.0)

## 2019-07-04 MED ORDER — PANTOPRAZOLE SODIUM 20 MG PO TBEC: 20.0000 mg | DELAYED_RELEASE_TABLET | Freq: Every day | ORAL | 2 refills | Status: DC

## 2019-07-04 MED ORDER — GABAPENTIN 300 MG PO CAPS: 300.0000 mg | ORAL_CAPSULE | Freq: Three times a day (TID) | ORAL | 1 refills | Status: DC

## 2019-07-04 MED ORDER — LEVETIRACETAM 500 MG PO TABS: 500.0000 mg | ORAL_TABLET | Freq: Two times a day (BID) | ORAL | 1 refills | Status: DC

## 2019-07-04 MED ORDER — ROPINIROLE HCL 1 MG PO TABS: 1.0000 mg | ORAL_TABLET | Freq: Every day | ORAL | 1 refills | Status: DC

## 2019-07-04 MED ORDER — ATORVASTATIN CALCIUM 40 MG PO TABS: 40.0000 mg | ORAL_TABLET | Freq: Every day | ORAL | 1 refills | Status: DC

## 2019-07-04 MED ORDER — CITALOPRAM HYDROBROMIDE 20 MG PO TABS: 20.0000 mg | ORAL_TABLET | Freq: Every day | ORAL | 1 refills | Status: DC

## 2019-07-04 MED ORDER — FUROSEMIDE 40 MG PO TABS: 40.0000 mg | ORAL_TABLET | Freq: Every day | ORAL | 1 refills | Status: DC

## 2019-07-04 MED ORDER — LOSARTAN POTASSIUM 25 MG PO TABS: 25.0000 mg | ORAL_TABLET | Freq: Every day | ORAL | 1 refills | Status: DC

## 2019-07-04 MED ORDER — LISINOPRIL 5 MG PO TABS: 5.0000 mg | ORAL_TABLET | Freq: Every day | ORAL | 1 refills | Status: DC

## 2019-07-04 NOTE — Patient Instructions (Signed)
Food Choices for Gastroesophageal Reflux Disease, Adult When you have gastroesophageal reflux disease (GERD), the foods you eat and your eating habits are very important. Choosing the right foods can help ease your discomfort. Think about working with a nutrition specialist (dietitian) to help you make good choices. What are tips for following this plan?  Meals  Choose healthy foods that are low in fat, such as fruits, vegetables, whole grains, low-fat dairy products, and lean meat, fish, and poultry.  Eat small meals often instead of 3 large meals a day. Eat your meals slowly, and in a place where you are relaxed. Avoid bending over or lying down until 2-3 hours after eating.  Avoid eating meals 2-3 hours before bed.  Avoid drinking a lot of liquid with meals.  Cook foods using methods other than frying. Bake, grill, or broil food instead.  Avoid or limit: ? Chocolate. ? Peppermint or spearmint. ? Alcohol. ? Pepper. ? Black and decaffeinated coffee. ? Black and decaffeinated tea. ? Bubbly (carbonated) soft drinks. ? Caffeinated energy drinks and soft drinks.  Limit high-fat foods such as: ? Fatty meat or fried foods. ? Whole milk, cream, butter, or ice cream. ? Nuts and nut butters. ? Pastries, donuts, and sweets made with butter or shortening.  Avoid foods that cause symptoms. These foods may be different for everyone. Common foods that cause symptoms include: ? Tomatoes. ? Oranges, lemons, and limes. ? Peppers. ? Spicy food. ? Onions and garlic. ? Vinegar. Lifestyle  Maintain a healthy weight. Ask your doctor what weight is healthy for you. If you need to lose weight, work with your doctor to do so safely.  Exercise for at least 30 minutes for 5 or more days each week, or as told by your doctor.  Wear loose-fitting clothes.  Do not smoke. If you need help quitting, ask your doctor.  Sleep with the head of your bed higher than your feet. Use a wedge under the  mattress or blocks under the bed frame to raise the head of the bed. Summary  When you have gastroesophageal reflux disease (GERD), food and lifestyle choices are very important in easing your symptoms.  Eat small meals often instead of 3 large meals a day. Eat your meals slowly, and in a place where you are relaxed.  Limit high-fat foods such as fatty meat or fried foods.  Avoid bending over or lying down until 2-3 hours after eating.  Avoid peppermint and spearmint, caffeine, alcohol, and chocolate. This information is not intended to replace advice given to you by your health care provider. Make sure you discuss any questions you have with your health care provider. Document Revised: 05/03/2018 Document Reviewed: 02/16/2016 Elsevier Patient Education  [place].  Diabetes Mellitus and Nutrition, Adult When you have diabetes (diabetes mellitus), it is very important to have healthy eating habits because your blood sugar (glucose) levels are greatly affected by what you eat and drink. Eating healthy foods in the appropriate amounts, at about the same times every day, can help you:  Control your blood glucose.  Lower your risk of heart disease.  Improve your blood pressure.  Reach or maintain a healthy weight. Every person with diabetes is different, and each person has different needs for a meal plan. Your health care provider may recommend that you work with a diet and nutrition specialist (dietitian) to make a meal plan that is best for you. Your meal plan may vary depending on factors such as:  The calories you need.  The medicines you take.  Your weight.  Your blood glucose, blood pressure, and cholesterol levels.  Your activity level.  Other health conditions you have, such as heart or kidney disease. How do carbohydrates affect me? Carbohydrates, also called carbs, affect your blood glucose level more than any other type of food. Eating carbs naturally raises  the amount of glucose in your blood. Carb counting is a method for keeping track of how many carbs you eat. Counting carbs is important to keep your blood glucose at a healthy level, especially if you use insulin or take certain oral diabetes medicines. It is important to know how many carbs you can safely have in each meal. This is different for every person. Your dietitian can help you calculate how many carbs you should have at each meal and for each snack. Foods that contain carbs include:  Bread, cereal, rice, pasta, and crackers.  Potatoes and corn.  Peas, beans, and lentils.  Milk and yogurt.  Fruit and juice.  Desserts, such as cakes, cookies, ice cream, and candy. How does alcohol affect me? Alcohol can cause a sudden decrease in blood glucose (hypoglycemia), especially if you use insulin or take certain oral diabetes medicines. Hypoglycemia can be a life-threatening condition. Symptoms of hypoglycemia (sleepiness, dizziness, and confusion) are similar to symptoms of having too much alcohol. If your health care provider says that alcohol is safe for you, follow these guidelines:  Limit alcohol intake to no more than 1 drink per day for nonpregnant women and 2 drinks per day for men. One drink equals 12 oz of beer, 5 oz of wine, or 1 oz of hard liquor.  Do not drink on an empty stomach.  Keep yourself hydrated with water, diet soda, or unsweetened iced tea.  Keep in mind that regular soda, juice, and other mixers may contain a lot of sugar and must be counted as carbs. What are tips for following this plan?  Reading food labels  Start by checking the serving size on the "Nutrition Facts" label of packaged foods and drinks. The amount of calories, carbs, fats, and other nutrients listed on the label is based on one serving of the item. Many items contain more than one serving per package.  Check the total grams (g) of carbs in one serving. You can calculate the number of  servings of carbs in one serving by dividing the total carbs by 15. For example, if a food has 30 g of total carbs, it would be equal to 2 servings of carbs.  Check the number of grams (g) of saturated and trans fats in one serving. Choose foods that have low or no amount of these fats.  Check the number of milligrams (mg) of salt (sodium) in one serving. Most people should limit total sodium intake to less than 2,300 mg per day.  Always check the nutrition information of foods labeled as "low-fat" or "nonfat". These foods may be higher in added sugar or refined carbs and should be avoided.  Talk to your dietitian to identify your daily goals for nutrients listed on the label. Shopping  Avoid buying canned, premade, or processed foods. These foods tend to be high in fat, sodium, and added sugar.  Shop around the outside edge of the grocery store. This includes fresh fruits and vegetables, bulk grains, fresh meats, and fresh dairy. Cooking  Use low-heat cooking methods, such as baking, instead of high-heat cooking methods like deep frying.  Cook using healthy oils, such as olive, canola, or sunflower oil.  Avoid cooking with butter, cream, or high-fat meats. Meal planning  Eat meals and snacks regularly, preferably at the same times every day. Avoid going long periods of time without eating.  Eat foods high in fiber, such as fresh fruits, vegetables, beans, and whole grains. Talk to your dietitian about how many servings of carbs you can eat at each meal.  Eat 4-6 ounces (oz) of lean protein each day, such as lean meat, chicken, fish, eggs, or tofu. One oz of lean protein is equal to: ? 1 oz of meat, chicken, or fish. ? 1 egg. ?  cup of tofu.  Eat some foods each day that contain healthy fats, such as avocado, nuts, seeds, and fish. Lifestyle  Check your blood glucose regularly.  Exercise regularly as told by your health care provider. This may include: ? 150 minutes of  moderate-intensity or vigorous-intensity exercise each week. This could be brisk walking, biking, or water aerobics. ? Stretching and doing strength exercises, such as yoga or weightlifting, at least 2 times a week.  Take medicines as told by your health care provider.  Do not use any products that contain nicotine or tobacco, such as cigarettes and e-cigarettes. If you need help quitting, ask your health care provider.  Work with a Social worker or diabetes educator to identify strategies to manage stress and any emotional and social challenges. Questions to ask a health care provider  Do I need to meet with a diabetes educator?  Do I need to meet with a dietitian?  What number can I call if I have questions?  When are the best times to check my blood glucose? Where to find more information:  American Diabetes Association: diabetes.org  Academy of Nutrition and Dietetics: www.eatright.[employer] of Diabetes and Digestive and Kidney Diseases (NIH): [employer] Summary  A healthy meal plan will help you control your blood glucose and maintain a healthy lifestyle.  Working with a diet and nutrition specialist (dietitian) can help you make a meal plan that is best for you.  Keep in mind that carbohydrates (carbs) and alcohol have immediate effects on your blood glucose levels. It is important to count carbs and to use alcohol carefully. This information is not intended to replace advice given to you by your health care provider. Make sure you discuss any questions you have with your health care provider. Document Revised: 12/23/2016 Document Reviewed: 02/15/2016 Elsevier Patient Education  2020 [employer].

## 2019-07-04 NOTE — Progress Notes (Signed)
Assessment & Plan:  1. Uncontrolled type 2 diabetes mellitus with hyperglycemia, with long-term current use of insulin (HCC) Lab Results  Component Value Date   HGBA1C 9.2 (H) 07/04/2019   HGBA1C 7.7 (H) 04/01/2019   HGBA1C 8.4 (H) 12/27/2018  - Diabetes is not at goal of A1c < 7. - Medications: continue current medications, until labs result to reassess kidney function - Home glucose monitoring: continue monitoring - Patient is currently taking a statin. Patient is taking an ACE-inhibitor/ARB.  - Last foot exam: 01/16/2019 - Last diabetic eye exam: 2019 - Urine Microalbumin/Creat Ratio: 10/23/2018 - Instruction/counseling given: reminded to get eye exam, reminded to bring blood glucose meter & log to each visit, discussed diet and provided printed educational material - Bayer DCA Hb A1c Waived - atorvastatin (LIPITOR) 40 MG tablet; Take 1 tablet (40 mg total) by mouth daily at 6 PM.  Dispense: 90 tablet; Refill: 1 - CMP14+EGFR  2. Hypertension associated with type 2 diabetes mellitus ([place]) - Well controlled on current regimen.  - furosemide (LASIX) 40 MG tablet; Take 1 tablet (40 mg total) by mouth daily.  Dispense: 90 tablet; Refill: 1 - losartan (COZAAR) 25 MG tablet; Take 1 tablet (25 mg total) by mouth daily.  Dispense: 90 tablet; Refill: 1 - lisinopril (ZESTRIL) 5 MG tablet; Take 1 tablet (5 mg total) by mouth daily.  Dispense: 90 tablet; Refill: 1 - CMP14+EGFR  3. Hyperlipidemia associated with type 2 diabetes mellitus ([place]) - Well controlled on current regimen.  - atorvastatin (LIPITOR) 40 MG tablet; Take 1 tablet (40 mg total) by mouth daily at 6 PM.  Dispense: 90 tablet; Refill: 1 - CMP14+EGFR  4. GERD without esophagitis - Uncontrolled. Omeprazole D/C'd. Rx'd Protonix. Education provided on food choices for GERD.  - pantoprazole (PROTONIX) 20 MG tablet; Take 1 tablet (20 mg total) by mouth daily.  Dispense: 30 tablet; Refill: 2 - CMP14+EGFR  5. Depression, recurrent  ([place]) - Well controlled on current regimen.  - citalopram (CELEXA) 20 MG tablet; Take 1 tablet (20 mg total) by mouth daily.  Dispense: 90 tablet; Refill: 1 - CMP14+EGFR  6. Restless leg syndrome - Well controlled on current regimen.  - gabapentin (NEURONTIN) 300 MG capsule; Take 1 capsule (300 mg total) by mouth 3 (three) times daily.  Dispense: 270 capsule; Refill: 1 - rOPINIRole (REQUIP) 1 MG tablet; Take 1 tablet (1 mg total) by mouth at bedtime.  Dispense: 90 tablet; Refill: 1 - CMP14+EGFR  7. History of CVA (cerebrovascular accident) - Patient to continue statin and ASA.  - atorvastatin (LIPITOR) 40 MG tablet; Take 1 tablet (40 mg total) by mouth daily at 6 PM.  Dispense: 90 tablet; Refill: 1  8. 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 Continue Keppra.  - levETIRAcetam (KEPPRA) 500 MG tablet; Take 1 tablet (500 mg total) by mouth 2 (two) times daily.  Dispense: 180 tablet; Refill: 1 - CMP14+EGFR - Levetiracetam level  9. Cramping of hands - CMP14+EGFR - Magnesium  10. Cramp of toe - CMP14+EGFR - Magnesium  11. Postmenopausal estrogen deficiency - DG [facility] DEXA  12. Colon cancer screening - Ambulatory referral to Gastroenterology  13. Encounter for hepatitis C screening test for low risk patient - Hepatitis C antibody  14. Immunization due - Immunizations given in office.  - Pneumococcal conjugate vaccine 13-valent - Varicella-zoster vaccine IM (Shingrix)  15. Healthcare maintenance - Patient to schedule mammogram on the bus. Her last mammogram is being requested from the [facility]  [place] in [place]. She reports having the COVID-19 vaccines and will bring vaccination card to her next appointment. DEXA ordered and will be scheduled.    Return in about 3 months (around 10/04/2019) for DM, GERD.  Hendricks Limes, MSN, APRN, FNP-C [facility]  Subjective:    Patient ID: Tracy Farrell, female    DOB: 02/27/1955, 64 y.o.    MRN: [id number]  Patient Care Team: Baruch Gouty, FNP as PCP - General (Family Medicine)   Chief Complaint:  Chief Complaint  Patient presents with  . Establish Care    rakes pt   . Diabetes    check up of chronic medical conditions  . Shortness of Breath    patient states in the last few months if she has to walk that she will get SOB.    HPI: Tracy Farrell is a 64 y.o. female presenting on 07/04/2019 for Establish Care (rakes pt ), Diabetes (check up of chronic medical conditions), and Shortness of Breath (patient states in the last few months if she has to walk that she will get SOB.)  Diabetes: Patient presents for follow up of diabetes. Current symptoms include: none. Known diabetic complications: cardiovascular disease and cerebrovascular disease. Medication compliance: yes. Current diet: in general, an "unhealthy" diet. Current exercise: none. Home blood sugar records: fasting BG ~175. Is she  on ACE inhibitor or angiotensin II receptor blocker? Yes. Is she on a statin? Yes.   Lab Results  Component Value Date   HGBA1C 9.2 (H) 07/04/2019   HGBA1C 7.7 (H) 04/01/2019   HGBA1C 8.4 (H) 12/27/2018   Lab Results  Component Value Date   LDLCALC 63 04/01/2019   CREATININE 2.83 (H) 07/04/2019     Patient does not feel omeprazole is working for her GERD.   She takes Keppra but does not know why. She is unaware of seizures. Upon chart review, it appears her seizures occurred when she had her stroke.   New complaints: Patient complains her hands and toes have been cramping for the past two weeks.   Patient reports shortness of breath with exertion.   Social history:  Relevant past medical, surgical, family and social history reviewed and updated as indicated. Interim medical history since our last visit reviewed.  Allergies and medications reviewed and updated.  DATA REVIEWED: CHART IN EPIC  ROS: Negative unless specifically indicated above in HPI.    Current  Outpatient Medications:  .  aspirin EC 81 MG tablet, Take 1 tablet (81 mg total) by mouth daily., Disp: 90 tablet, Rfl: 2 .  furosemide (LASIX) 40 MG tablet, Take 1 tablet (40 mg total) by mouth daily., Disp: 30 tablet, Rfl: 3 .  gabapentin (NEURONTIN) 300 MG capsule, Take 1 capsule (300 mg total) by mouth 3 (three) times daily., Disp: 90 capsule, Rfl: 1 .  glucose blood test strip, Use as instructed, Disp: 100 each, Rfl: 12 .  insulin isophane & regular human (HUMULIN 70/30 MIX) (70-30) 100 UNIT/ML KwikPen, Inject 50 Units into the skin 2 (two) times daily with a meal., Disp: 15 mL, Rfl: 11 .  Insulin Pen Needle 32G X 4 MM MISC, 1 application by Does not apply route in the morning, at noon, in the evening, and at bedtime., Disp: 1000 each, Rfl: 3 .  losartan (COZAAR) 25 MG tablet, Take 1 tablet (25 mg total) by mouth daily., Disp: 90 tablet, Rfl: 0 .  rOPINIRole (REQUIP) 1 MG tablet, Take 1 tablet (1 mg total)  by mouth at bedtime., Disp: 90 tablet, Rfl: 0 .  atorvastatin (LIPITOR) 40 MG tablet, Take 1 tablet (40 mg total) by mouth daily at 6 PM., Disp: 30 tablet, Rfl: 3 .  citalopram (CELEXA) 20 MG tablet, Take 1 tablet (20 mg total) by mouth daily., Disp: 90 tablet, Rfl: 0 .  levETIRAcetam (KEPPRA) 500 MG tablet, Take 1 tablet (500 mg total) by mouth 2 (two) times daily., Disp: 60 tablet, Rfl: 2 .  lisinopril (ZESTRIL) 5 MG tablet, Take 1 tablet (5 mg total) by mouth daily., Disp: 90 tablet, Rfl: 0 .  metFORMIN (GLUCOPHAGE) 1000 MG tablet, Take 1 tablet (1,000 mg total) by mouth 2 (two) times daily with a meal., Disp: 180 tablet, Rfl: 1 .  omeprazole (PRILOSEC) 20 MG capsule, Take 1 capsule (20 mg total) by mouth daily., Disp: 90 capsule, Rfl: 1   Allergies  Allergen Reactions  . Sulfa Antibiotics Other (See Comments)    UNKNOWN REACTION   Past Medical History:  Diagnosis Date  . Arthritis   . Carpal tunnel syndrome, bilateral   . Chronic back pain   . Depression   . Diabetes mellitus     . GERD (gastroesophageal reflux disease)   . Headache(784.0)   . Hyperlipemia   . Hypertension   . RLS (restless legs syndrome)   . Stroke [facility])     Past Surgical History:  Procedure Laterality Date  . ABDOMINAL HYSTERECTOMY    . CARPAL TUNNEL RELEASE     bilateral   . CARPAL TUNNEL RELEASE  08/17/2011   Procedure: CARPAL TUNNEL RELEASE;  Surgeon: Wynonia Sours, MD;  Location: [facility];  Service: Orthopedics;  Laterality: Right;  re-release carpal canal hypothenar fat pad transfer  . KNEE SURGERY     right   . LUMBAR EPIDURAL INJECTION      Social History   Socioeconomic History  . Marital status: Married    Spouse name: Abe People  . Number of children: 2  . Years of education: Not on file  . Highest education level: Not on file  Occupational History  . Not on file  Tobacco Use  . Smoking status: Never Smoker  . Smokeless tobacco: Never Used  Vaping Use  . Vaping Use: Never used  Substance and Sexual Activity  . Alcohol use: No  . Drug use: Never  . Sexual activity: Never  Other Topics Concern  . Not on file  Social History Narrative   Lives with husband , daughter passed away 03/31/18   Social Determinants of Health   Financial Resource Strain:   . Difficulty of Paying Living Expenses:   Food Insecurity:   . Worried About Charity fundraiser in the Last Year:   . Arboriculturist in the Last Year:   Transportation Needs:   . Film/video editor (Medical):   Marland Kitchen Lack of Transportation (Non-Medical):   Physical Activity:   . Days of Exercise per Week:   . Minutes of Exercise per Session:   Stress:   . Feeling of Stress :   Social Connections:   . Frequency of Communication with Friends and Family:   . Frequency of Social Gatherings with Friends and Family:   . Attends Religious Services:   . Active Member of Clubs or Organizations:   . Attends Archivist Meetings:   Marland Kitchen Marital Status:   Intimate Partner Violence:   . Fear of Current or  Ex-Partner:   . Emotionally Abused:   .  Physically Abused:   . Sexually Abused:         Objective:    BP 135/84   Pulse 81   Temp (!) 97.4 F (36.3 C) (Temporal)   Ht '5\' 2"'  (1.575 m)   Wt 226 lb 9.6 oz (102.8 kg)   SpO2 95%   BMI 41.45 kg/m   Wt Readings from Last 3 Encounters:  07/04/19 226 lb 9.6 oz (102.8 kg)  04/01/19 230 lb (104.3 kg)  01/16/19 218 lb (98.9 kg)    Physical Exam Vitals reviewed.  Constitutional:      General: She is not in acute distress.    Appearance: Normal appearance. She is morbidly obese. She is not ill-appearing, toxic-appearing or diaphoretic.  HENT:     Head: Normocephalic and atraumatic.  Eyes:     General: No scleral icterus.       Right eye: No discharge.        Left eye: No discharge.     Conjunctiva/sclera: Conjunctivae normal.  Cardiovascular:     Rate and Rhythm: Normal rate and regular rhythm.     Heart sounds: Normal heart sounds. No murmur heard.  No friction rub. No gallop.   Pulmonary:     Effort: Pulmonary effort is normal. No respiratory distress.     Breath sounds: Normal breath sounds. No stridor. No wheezing, rhonchi or rales.  Musculoskeletal:        General: Normal range of motion.     Cervical back: Normal range of motion.  Skin:    General: Skin is warm and dry.     Capillary Refill: Capillary refill takes less than 2 seconds.  Neurological:     General: No focal deficit present.     Mental Status: She is alert and oriented to person, place, and time. Mental status is at baseline.  Psychiatric:        Mood and Affect: Mood normal.        Behavior: Behavior normal.        Thought Content: Thought content normal.        Judgment: Judgment normal.     Lab Results  Component Value Date   TSH 2.270 10/23/2018   Lab Results  Component Value Date   WBC 8.0 04/01/2019   HGB 11.2 04/01/2019   HCT 34.7 04/01/2019   MCV 83 04/01/2019   PLT 340 04/01/2019   Lab Results  Component Value Date   NA 136  07/04/2019   K 4.3 07/04/2019   CO2 21 07/04/2019   GLUCOSE 193 (H) 07/04/2019   BUN 33 (H) 07/04/2019   CREATININE 2.83 (H) 07/04/2019   BILITOT 0.3 07/04/2019   ALKPHOS 100 07/04/2019   AST 9 07/04/2019   ALT 8 07/04/2019   PROT 6.3 07/04/2019   ALBUMIN 3.9 07/04/2019   CALCIUM 8.4 (L) 07/04/2019   ANIONGAP 11 12/27/2018   Lab Results  Component Value Date   CHOL 142 04/01/2019   Lab Results  Component Value Date   HDL 50 04/01/2019   Lab Results  Component Value Date   LDLCALC 63 04/01/2019   Lab Results  Component Value Date   TRIG 175 (H) 04/01/2019   Lab Results  Component Value Date   CHOLHDL 2.8 04/01/2019   Lab Results  Component Value Date   HGBA1C 9.2 (H) 07/04/2019

## 2019-07-06 LAB — CMP14+EGFR
ALT: 8 IU/L (ref 0–32)
AST: 9 IU/L (ref 0–40)
Albumin/Globulin Ratio: 1.6 (ref 1.2–2.2)
Albumin: 3.9 g/dL (ref 3.8–4.8)
Alkaline Phosphatase: 100 IU/L (ref 48–121)
BUN/Creatinine Ratio: 12 (ref 12–28)
BUN: 33 mg/dL — ABNORMAL HIGH (ref 8–27)
Bilirubin Total: 0.3 mg/dL (ref 0.0–1.2)
CO2: 21 mmol/L (ref 20–29)
Calcium: 8.4 mg/dL — ABNORMAL LOW (ref 8.7–10.3)
Chloride: 97 mmol/L (ref 96–106)
Creatinine, Ser: 2.83 mg/dL — ABNORMAL HIGH (ref 0.57–1.00)
GFR calc Af Amer: 19 mL/min/{1.73_m2} — ABNORMAL LOW (ref >59)
GFR calc non Af Amer: 17 mL/min/{1.73_m2} — ABNORMAL LOW (ref >59)
Globulin, Total: 2.4 g/dL (ref 1.5–4.5)
Glucose: 193 mg/dL — ABNORMAL HIGH (ref 65–99)
Potassium: 4.3 mmol/L (ref 3.5–5.2)
Sodium: 136 mmol/L (ref 134–144)
Total Protein: 6.3 g/dL (ref 6.0–8.5)

## 2019-07-06 LAB — MAGNESIUM: Magnesium: 0.7 mg/dL — CL (ref 1.6–2.3)

## 2019-07-06 LAB — LEVETIRACETAM LEVEL: Levetiracetam Lvl: 34.4 ug/mL (ref 10.0–40.0)

## 2019-07-06 LAB — HEPATITIS C ANTIBODY: Hep C Virus Ab: <0.1 s/co ratio (ref 0.0–0.9)

## 2019-07-07 ENCOUNTER — Encounter: Payer: Self-pay | Admitting: Family Medicine

## 2019-07-07 DIAGNOSIS — N184 Chronic kidney disease, stage 4 (severe): Secondary | ICD-10-CM

## 2019-07-07 DIAGNOSIS — Z87898 Personal history of other specified conditions: Secondary | ICD-10-CM | POA: Insufficient documentation

## 2019-07-07 HISTORY — DX: Chronic kidney disease, stage 4 (severe): N18.4

## 2019-07-07 HISTORY — DX: Hypomagnesemia: E83.42

## 2019-07-08 ENCOUNTER — Other Ambulatory Visit: Payer: Self-pay | Admitting: *Deleted

## 2019-07-08 DIAGNOSIS — Z794 Long term (current) use of insulin: Secondary | ICD-10-CM

## 2019-07-08 DIAGNOSIS — R7989 Other specified abnormal findings of blood chemistry: Secondary | ICD-10-CM

## 2019-07-09 ENCOUNTER — Encounter: Payer: Self-pay | Admitting: Internal Medicine

## 2019-07-09 ENCOUNTER — Telehealth: Payer: Self-pay | Admitting: Family Medicine

## 2019-07-09 NOTE — Telephone Encounter (Signed)
Reviewed results and recommendations with pt's grand daughter and she voiced understanding.

## 2019-07-09 NOTE — Telephone Encounter (Signed)
Pts grand daughter, Joretta Bachelor calling to speak with a nurse to discuss pts labs regarding her kidneys. She is on pts DPR.

## 2019-07-11 ENCOUNTER — Ambulatory Visit (INDEPENDENT_AMBULATORY_CARE_PROVIDER_SITE_OTHER): Payer: Medicare Other | Admitting: Pharmacist

## 2019-07-11 ENCOUNTER — Other Ambulatory Visit: Payer: Self-pay

## 2019-07-11 DIAGNOSIS — E1159 Type 2 diabetes mellitus with other circulatory complications: Secondary | ICD-10-CM | POA: Diagnosis not present

## 2019-07-11 DIAGNOSIS — E1165 Type 2 diabetes mellitus with hyperglycemia: Secondary | ICD-10-CM

## 2019-07-11 DIAGNOSIS — Z794 Long term (current) use of insulin: Secondary | ICD-10-CM | POA: Diagnosis not present

## 2019-07-11 DIAGNOSIS — I10 Essential (primary) hypertension: Secondary | ICD-10-CM | POA: Diagnosis not present

## 2019-07-11 MED ORDER — TRULICITY 1.5 MG/0.5ML ~~LOC~~ SOAJ: 1.5000 mg | SUBCUTANEOUS | 4 refills | Status: DC

## 2019-07-11 NOTE — Progress Notes (Signed)
    07/11/2019 Name: Tracy Farrell MRN: [id number] DOB: January 11, 1956   S:  28 yoF presents for diabetes evaluation, education, and management Patient was referred and last seen by Primary Care Provider on 07/04/19.  Insurance coverage/medication affordability: UHC medicare/medicaid  Patient reports adherence with medications. . Current diabetes medications include: insulin 70/30, metformin . Current hypertension medications include: lisinopril, losartan Goal 130/80 . Current hyperlipidemia medications include: atorvastatin   Patient denies hypoglycemic events.   Patient reported dietary habits: Eats 2-3 meals/day  Discussed meal planning options and Plate method for health eating Avoid sugary drinks and desserts Incorporate balanced protein, non starchy veggies, 1 serving of carbohydrate Increase water intake Increase physical activity as able.  Patient-reported exercise habits: n/a  O:  Lab Results  Component Value Date   HGBA1C 9.2 (H) 07/04/2019    Lipid Panel     Component Value Date/Time   CHOL 142 04/01/2019 1153   TRIG 175 (H) 04/01/2019 1153   HDL 50 04/01/2019 1153   CHOLHDL 2.8 04/01/2019 1153   CHOLHDL 2.3 12/27/2018 0945   VLDL 15 12/27/2018 0945   LDLCALC 63 04/01/2019 1153    Home fasting blood sugars: 150-180  2 hour post-meal/random blood sugars: n/a.     A/P:  Diabetes T2DM currently uncontrolled. Patient is able to verbalize appropriate hypoglycemia management plan. Patient is adherent with medication. Control is suboptimal due to diet.  -Decarese insulin 70/30 to 30-40 units BID  Will transition to tresiba at next visit  -Started GLP trulicty (covered by [facility]) 0.75 mg sq weekly  Sample given --> BSJ#[id number] C, EXP 02/03/21  Increase to 1.5mg  sq weekly in 4 weeks  -Unclear why patient is on ACE/ARB--message sent to provider to clarify  -Extensively discussed pathophysiology of diabetes, recommended lifestyle interventions, dietary  effects on blood sugar control  -Counseled on s/sx of and management of hypoglycemia  -Next A1C anticipated 3 months  -Recommended referral to neuro --> patient s/p stroke in the fall 2020 (potentially had a seizure), Never saw neuro on discharge due to high copay--but insurance is better now   Written patient instructions provided.  Total time in face to face counseling 30 minutes.   Follow up PCP Clinic Visit ON 10/04/19 Follow up PharmD in 4 weeks   Regina Eck, PharmD, BCPS Clinical Pharmacist, [place]  II Phone [phone]

## 2019-07-12 ENCOUNTER — Telehealth: Payer: Self-pay | Admitting: Family Medicine

## 2019-07-12 DIAGNOSIS — Z87898 Personal history of other specified conditions: Secondary | ICD-10-CM

## 2019-07-12 DIAGNOSIS — Z8673 Personal history of transient ischemic attack (TIA), and cerebral infarction without residual deficits: Secondary | ICD-10-CM

## 2019-07-12 NOTE — Telephone Encounter (Signed)
-----   Message from Lavera Guise, [facility] sent at 07/12/2019  8:43 AM EDT ----- I'm not able to place referral or I would! Thank you :) ----- Message ----- From: Loman Brooklyn, FNP Sent: 07/11/2019   4:57 PM EDT To: Lavera Guise, RPH  I don't think that it is a bad idea to still send her. Are you able to place the referral in your encounter or do I need to open a separate one and place the referral? ----- Message ----- From: Lavera Guise, [facility] Sent: 07/11/2019   2:55 PM EDT To: Loman Brooklyn, FNP  Patient s/p stroke in the fall 2020 (potentially had a seizure) Never saw neuro on discharge due to high copay--but insurance is better now Does she need to neuro now? Just want to be thorough Still having memory pauses, etc--otherwise doing okay

## 2019-07-12 NOTE — Telephone Encounter (Signed)
Referral placed.

## 2019-07-12 NOTE — Telephone Encounter (Signed)
Patient aware.

## 2019-07-23 ENCOUNTER — Encounter: Payer: Self-pay | Admitting: Pharmacist

## 2019-07-25 ENCOUNTER — Other Ambulatory Visit (HOSPITAL_COMMUNITY): Payer: Self-pay | Admitting: Nephrology

## 2019-07-25 ENCOUNTER — Other Ambulatory Visit: Payer: Self-pay | Admitting: Nephrology

## 2019-07-25 DIAGNOSIS — N189 Chronic kidney disease, unspecified: Secondary | ICD-10-CM | POA: Diagnosis not present

## 2019-07-25 DIAGNOSIS — I129 Hypertensive chronic kidney disease with stage 1 through stage 4 chronic kidney disease, or unspecified chronic kidney disease: Secondary | ICD-10-CM | POA: Diagnosis not present

## 2019-07-25 DIAGNOSIS — N179 Acute kidney failure, unspecified: Secondary | ICD-10-CM

## 2019-07-25 DIAGNOSIS — I5032 Chronic diastolic (congestive) heart failure: Secondary | ICD-10-CM | POA: Diagnosis not present

## 2019-07-25 DIAGNOSIS — N170 Acute kidney failure with tubular necrosis: Secondary | ICD-10-CM | POA: Diagnosis not present

## 2019-08-05 ENCOUNTER — Ambulatory Visit (HOSPITAL_COMMUNITY)
Admission: RE | Admit: 2019-08-05 | Discharge: 2019-08-05 | Disposition: A | Discharge status: Home / Self Care | Payer: Medicare Other | Source: Ambulatory Visit | Attending: Nephrology | Admitting: Nephrology

## 2019-08-05 ENCOUNTER — Other Ambulatory Visit: Payer: Self-pay

## 2019-08-05 DIAGNOSIS — N189 Chronic kidney disease, unspecified: Secondary | ICD-10-CM | POA: Diagnosis not present

## 2019-08-05 DIAGNOSIS — Z79899 Other long term (current) drug therapy: Secondary | ICD-10-CM | POA: Diagnosis not present

## 2019-08-05 DIAGNOSIS — E559 Vitamin D deficiency, unspecified: Secondary | ICD-10-CM | POA: Diagnosis not present

## 2019-08-05 DIAGNOSIS — I129 Hypertensive chronic kidney disease with stage 1 through stage 4 chronic kidney disease, or unspecified chronic kidney disease: Secondary | ICD-10-CM | POA: Diagnosis not present

## 2019-08-05 DIAGNOSIS — E1122 Type 2 diabetes mellitus with diabetic chronic kidney disease: Secondary | ICD-10-CM | POA: Diagnosis not present

## 2019-08-05 DIAGNOSIS — N170 Acute kidney failure with tubular necrosis: Secondary | ICD-10-CM | POA: Diagnosis not present

## 2019-08-05 DIAGNOSIS — Z1159 Encounter for screening for other viral diseases: Secondary | ICD-10-CM | POA: Diagnosis not present

## 2019-08-05 DIAGNOSIS — N179 Acute kidney failure, unspecified: Secondary | ICD-10-CM | POA: Diagnosis not present

## 2019-08-05 DIAGNOSIS — Q600 Renal agenesis, unilateral: Secondary | ICD-10-CM | POA: Diagnosis not present

## 2019-08-05 DIAGNOSIS — I5032 Chronic diastolic (congestive) heart failure: Secondary | ICD-10-CM | POA: Diagnosis not present

## 2019-08-08 ENCOUNTER — Encounter: Payer: Self-pay | Admitting: Pharmacist

## 2019-08-08 ENCOUNTER — Other Ambulatory Visit: Payer: Self-pay

## 2019-08-08 ENCOUNTER — Ambulatory Visit (INDEPENDENT_AMBULATORY_CARE_PROVIDER_SITE_OTHER): Payer: Medicare Other | Admitting: Pharmacist

## 2019-08-08 VITALS — BP 136/80 | HR 90

## 2019-08-08 DIAGNOSIS — E1165 Type 2 diabetes mellitus with hyperglycemia: Secondary | ICD-10-CM | POA: Diagnosis not present

## 2019-08-08 DIAGNOSIS — Z794 Long term (current) use of insulin: Secondary | ICD-10-CM | POA: Diagnosis not present

## 2019-08-08 NOTE — Progress Notes (Signed)
FBG [id number]    08/08/2019 Name: Tracy Farrell MRN: [id number] DOB: 28-Nov-1955   S:  82 yoF presents for diabetes evaluation, education, and management Patient was referred and last seen by Primary Care Provider on 07/04/19.  Insurance coverage/medication affordability: [employer]HC medicare/medicaid  Patient reports adherence with medications.  Current diabetes medications include: insulin 12/19, trulicity  Current hypertension medications include: n/a (ACEi held due to kidneys per nephro) Goal 130/80  Current hyperlipidemia medications include: atorvastatin   Patient denies hypoglycemic events.   Patient reported dietary habits: Eats 2-3 meals/day  Discussed meal planning options and Plate method for health eating Avoid sugary drinks and desserts Incorporate balanced protein, non starchy veggies, 1 serving of carbohydrate Increase water intake Increase physical activity as able.   Patient-reported exercise habits: n/a having back pain   O:  Lab Results  Component Value Date   HGBA1C 9.2 (H) 07/04/2019    Vitals:   08/08/19 1430  BP: 136/80  Pulse: 90    Lipid Panel     Component Value Date/Time   CHOL 142 04/01/2019 1153   TRIG 175 (H) 04/01/2019 1153   HDL 50 04/01/2019 1153   CHOLHDL 2.8 04/01/2019 1153   CHOLHDL 2.3 12/27/2018 0945   VLDL 15 12/27/2018 0945   LDLCALC 63 04/01/2019 1153    Home fasting blood sugars: doesn't write down, but recalls <200  2 hour post-meal/random blood sugars: 374 today in office, patient states she just had an energy drink and is unsure how much sugar was in the can    A/P:  Diabetes T2DM currently uncontrolled. Patient is able to verbalize appropriate hypoglycemia management plan. Patient is adherent with medication.   -Increase insulin 70/30 to 40 units BID             Consider transition to tresiba at next visit  -Started GLP trulicty (covered by [facility]) 0.75 mg sq weekly ? Increase to 3mg  sq weekly at repeat A1C  follow up  -Extensively discussed pathophysiology of diabetes, recommended lifestyle interventions, dietary effects on blood sugar control  -Counseled on s/sx of and management of hypoglycemia  -Next A1C anticipated 09/2019.  Written patient instructions provided.  Total time in face to face counseling 30 minutes.   Follow up PCP Clinic Visit in 09/2019.    Regina Eck, PharmD, BCPS Clinical Pharmacist, [place]  II Phone [phone]

## 2019-08-28 DIAGNOSIS — I5032 Chronic diastolic (congestive) heart failure: Secondary | ICD-10-CM | POA: Diagnosis not present

## 2019-08-28 DIAGNOSIS — N170 Acute kidney failure with tubular necrosis: Secondary | ICD-10-CM | POA: Diagnosis not present

## 2019-08-28 DIAGNOSIS — N189 Chronic kidney disease, unspecified: Secondary | ICD-10-CM | POA: Diagnosis not present

## 2019-08-28 DIAGNOSIS — I129 Hypertensive chronic kidney disease with stage 1 through stage 4 chronic kidney disease, or unspecified chronic kidney disease: Secondary | ICD-10-CM | POA: Diagnosis not present

## 2019-09-04 ENCOUNTER — Telehealth: Payer: Self-pay | Admitting: *Deleted

## 2019-09-04 ENCOUNTER — Ambulatory Visit (INDEPENDENT_AMBULATORY_CARE_PROVIDER_SITE_OTHER): Payer: Medicare Other | Admitting: Gastroenterology

## 2019-09-04 ENCOUNTER — Other Ambulatory Visit: Payer: Self-pay

## 2019-09-04 ENCOUNTER — Encounter: Payer: Self-pay | Admitting: *Deleted

## 2019-09-04 ENCOUNTER — Encounter: Payer: Self-pay | Admitting: Gastroenterology

## 2019-09-04 VITALS — BP 151/82 | HR 80 | Temp 96.6°F | Ht 62.0 in | Wt 223.6 lb

## 2019-09-04 DIAGNOSIS — K219 Gastro-esophageal reflux disease without esophagitis: Secondary | ICD-10-CM | POA: Diagnosis not present

## 2019-09-04 DIAGNOSIS — R112 Nausea with vomiting, unspecified: Secondary | ICD-10-CM | POA: Insufficient documentation

## 2019-09-04 DIAGNOSIS — R1013 Epigastric pain: Secondary | ICD-10-CM | POA: Diagnosis not present

## 2019-09-04 DIAGNOSIS — D509 Iron deficiency anemia, unspecified: Secondary | ICD-10-CM | POA: Insufficient documentation

## 2019-09-04 NOTE — Patient Instructions (Signed)
1. I suspect your dark stools may be due to the iron. Black stools can be a sign of bleeding. Iron and Pepto can also make your stools dark. We will recheck for worsening anemia. 2. If your stools become black, loose, tarry looking and you feel like your weakness worsens, consider going to ER for evaluation.  3. Plan for colonoscopy and upper endoscopy in the near future. See separate instructions.
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
## 2019-09-04 NOTE — Telephone Encounter (Signed)
PA approved for TCS/EGD via [place]. Auth# [id number] dates 10/08/2019-01/06/2020

## 2019-09-09 ENCOUNTER — Encounter: Payer: Self-pay | Admitting: Pharmacist

## 2019-09-09 ENCOUNTER — Ambulatory Visit (INDEPENDENT_AMBULATORY_CARE_PROVIDER_SITE_OTHER): Payer: Medicare Other | Admitting: Pharmacist

## 2019-09-09 ENCOUNTER — Other Ambulatory Visit: Payer: Self-pay

## 2019-09-09 VITALS — BP 144/82 | HR 77

## 2019-09-09 DIAGNOSIS — E1165 Type 2 diabetes mellitus with hyperglycemia: Secondary | ICD-10-CM

## 2019-09-09 DIAGNOSIS — Z794 Long term (current) use of insulin: Secondary | ICD-10-CM

## 2019-09-09 MED ORDER — TRULICITY 3 MG/0.5ML ~~LOC~~ SOAJ
3.0000 mg | SUBCUTANEOUS | 3 refills | Status: DC
Start: 2019-09-09 — End: 2020-02-04

## 2019-09-09 NOTE — Progress Notes (Signed)
    09/09/2019 Name: Tracy Farrell MRN: [id number] DOB: Dec 24, 1955   S:  35 yoF presents for diabetes evaluation, education, and management Patient was referred and last seen by Primary Care Provider on 07/04/19. Insurance coverage/medication affordability:[employer]HC medicare/medicaid  Patientreportsadherence with medications.  Current diabetes medications include:insulin 22/63, trulicity  Current hypertension medications include:n/a (ACEi held due to kidneys per nephro) Goal 130/80  Current hyperlipidemia medications include:atorvastatin   Patient denies hypoglycemic events.   Patient reported dietary habits: Eats 2-3 meals/day Discussed meal planning options and Plate method for healthy eating . Avoid sugary drinks and desserts . Incorporate balanced protein, non starchy veggies, 1 serving of carbohydrate with each meal . Increase water intake . Increase physical activity as able  Patient-reported exercise habits: n/a, back pain  Patient reports nocturia (nighttime urination).  Patient denies neuropathy (nerve pain).  Patient denies visual changes.  Patient reports self foot exams.    O:  Lab Results  Component Value Date   HGBA1C 9.2 (H) 07/04/2019    Vitals:   09/09/19 0939  BP: (!) 144/82  Pulse: 77     Lipid Panel     Component Value Date/Time   CHOL 142 04/01/2019 1153   TRIG 175 (H) 04/01/2019 1153   HDL 50 04/01/2019 1153   CHOLHDL 2.8 04/01/2019 1153   CHOLHDL 2.3 12/27/2018 0945   VLDL 15 12/27/2018 0945   LDLCALC 63 04/01/2019 1153    Home fasting blood sugars: not checking FBG today was 368  2 hour post-meal/random blood sugars: not checking.   A/P:  Diabetes T2DM currently uncontrolled. Patient is able to verbalize appropriate hypoglycemia management plan. Patient is adherent with medication. Control is suboptimal due to diet/lifestyle.  -Increase insulin70/30 to 40 units BID (patient was still taking 35 units twice  daily) Consider transition to tresiba as able  -Increase GLP trulicty (covered by medicaid) to 3 mg sq weekly ? Patient denies GI side effects ? Denies h/o thyroid cancer  -Extensively discussed pathophysiology of diabetes, recommended lifestyle interventions, dietary effects on blood sugar control  -Counseled on s/sx of and management of hypoglycemia  -Next A1C anticipated 10/04/19.    Written patient instructions provided.  Total time in face to face counseling 30 minutes.   Follow up PCP Clinic Visit ON 10/04/19.   Regina Eck, PharmD, BCPS Clinical Pharmacist, [place]  II Phone [phone]

## 2019-09-10 ENCOUNTER — Encounter: Payer: Self-pay | Admitting: Gastroenterology

## 2019-09-13 ENCOUNTER — Ambulatory Visit: Payer: Medicare Other | Admitting: Family Medicine

## 2019-09-13 ENCOUNTER — Encounter: Payer: Self-pay | Admitting: Family Medicine

## 2019-10-02 NOTE — Patient Instructions (Signed)
Tracy Farrell  10/02/2019     @PREFPERIOPPHARMACY @   Your procedure is scheduled on 10/08/2019.  Report to [facility] at  1245  P.M.  Call this number if you have problems the morning of surgery:  [phone]   Remember:  Follow the diet and prep instructions given to you by the office.                      Take these medicines the morning of surgery with A SIP OF WATER  Celexa, gabapentin, keppra, protonix. DO NOT take any medications for diabetes the morning of your procedure    Do not wear jewelry, make-up or nail polish.  Do not wear lotions, powders, or perfumes. Please wear deodorant and brush your teeth.  Do not shave 48 hours prior to surgery.  Men may shave face and neck.  Do not bring valuables to the hospital.  [facility] is not responsible for any belongings or valuables.  Contacts, dentures or bridgework may not be worn into surgery.  Leave your suitcase in the car.  After surgery it may be brought to your room.  For patients admitted to the hospital, discharge time will be determined by your treatment team.  Patients discharged the day of surgery will not be allowed to drive home.   Name and phone number of your driver:   family Special instructions:   DO NOT smoke the morning of your procedure.  Please read over the following fact sheets that you were given. Anesthesia Post-op Instructions and Care and Recovery After Surgery       Upper Endoscopy, Adult, Care After This sheet gives you information about how to care for yourself after your procedure. Your health care provider may also give you more specific instructions. If you have problems or questions, contact your health care provider. What can I expect after the procedure? After the procedure, it is common to have:  A sore throat.  Mild stomach pain or discomfort.  Bloating.  Nausea. Follow these instructions at home:   Follow instructions from your health care provider about what  to eat or drink after your procedure.  Return to your normal activities as told by your health care provider. Ask your health care provider what activities are safe for you.  Take over-the-counter and prescription medicines only as told by your health care provider.  Do not drive for 24 hours if you were given a sedative during your procedure.  Keep all follow-up visits as told by your health care provider. This is important. Contact a health care provider if you have:  A sore throat that lasts longer than one day.  Trouble swallowing. Get help right away if:  You vomit blood or your vomit looks like coffee grounds.  You have: ? A fever. ? Bloody, black, or tarry stools. ? A severe sore throat or you cannot swallow. ? Difficulty breathing. ? Severe pain in your chest or abdomen. Summary  After the procedure, it is common to have a sore throat, mild stomach discomfort, bloating, and nausea.  Do not drive for 24 hours if you were given a sedative during the procedure.  Follow instructions from your health care provider about what to eat or drink after your procedure.  Return to your normal activities as told by your health care provider. This information is not intended to replace advice given to you by your health care provider. Make  sure you discuss any questions you have with your health care provider. Document Revised: 07/04/2017 Document Reviewed: 06/12/2017 Elsevier Patient Education  [place].  Colonoscopy, Adult, Care After This sheet gives you information about how to care for yourself after your procedure. Your health care provider may also give you more specific instructions. If you have problems or questions, contact your health care provider. What can I expect after the procedure? After the procedure, it is common to have:  A small amount of blood in your stool for 24 hours after the procedure.  Some gas.  Mild cramping or bloating of your  abdomen. Follow these instructions at home: Eating and drinking   Drink enough fluid to keep your urine pale yellow.  Follow instructions from your health care provider about eating or drinking restrictions.  Resume your normal diet as instructed by your health care provider. Avoid heavy or fried foods that are hard to digest. Activity  Rest as told by your health care provider.  Avoid sitting for a long time without moving. Get up to take short walks every 1-2 hours. This is important to improve blood flow and breathing. Ask for help if you feel weak or unsteady.  Return to your normal activities as told by your health care provider. Ask your health care provider what activities are safe for you. Managing cramping and bloating   Try walking around when you have cramps or feel bloated.  Apply heat to your abdomen as told by your health care provider. Use the heat source that your health care provider recommends, such as a moist heat pack or a heating pad. ? Place a towel between your skin and the heat source. ? Leave the heat on for 20-30 minutes. ? Remove the heat if your skin turns bright red. This is especially important if you are unable to feel pain, heat, or cold. You may have a greater risk of getting burned. General instructions  For the first 24 hours after the procedure: ? Do not drive or use machinery. ? Do not sign important documents. ? Do not drink alcohol. ? Do your regular daily activities at a slower pace than normal. ? Eat soft foods that are easy to digest.  Take over-the-counter and prescription medicines only as told by your health care provider.  Keep all follow-up visits as told by your health care provider. This is important. Contact a health care provider if:  You have blood in your stool 2-3 days after the procedure. Get help right away if you have:  More than a small spotting of blood in your stool.  Large blood clots in your stool.  Swelling  of your abdomen.  Nausea or vomiting.  A fever.  Increasing pain in your abdomen that is not relieved with medicine. Summary  After the procedure, it is common to have a small amount of blood in your stool. You may also have mild cramping and bloating of your abdomen.  For the first 24 hours after the procedure, do not drive or use machinery, sign important documents, or drink alcohol.  Get help right away if you have a lot of blood in your stool, nausea or vomiting, a fever, or increased pain in your abdomen. This information is not intended to replace advice given to you by your health care provider. Make sure you discuss any questions you have with your health care provider. Document Revised: 08/06/2018 Document Reviewed: 08/06/2018 Elsevier Patient Education  [place]. Monitored Anesthesia  Care, Care After These instructions provide you with information about caring for yourself after your procedure. Your health care provider may also give you more specific instructions. Your treatment has been planned according to current medical practices, but problems sometimes occur. Call your health care provider if you have any problems or questions after your procedure. What can I expect after the procedure? After your procedure, you may:  Feel sleepy for several hours.  Feel clumsy and have poor balance for several hours.  Feel forgetful about what happened after the procedure.  Have poor judgment for several hours.  Feel nauseous or vomit.  Have a sore throat if you had a breathing tube during the procedure. Follow these instructions at home: For at least 24 hours after the procedure:      Have a responsible adult stay with you. It is important to have someone help care for you until you are awake and alert.  Rest as needed.  Do not: ? Participate in activities in which you could fall or become injured. ? Drive. ? Use heavy machinery. ? Drink alcohol. ? Take  sleeping pills or medicines that cause drowsiness. ? Make important decisions or sign legal documents. ? Take care of children on your own. Eating and drinking  Follow the diet that is recommended by your health care provider.  If you vomit, drink water, juice, or soup when you can drink without vomiting.  Make sure you have little or no nausea before eating solid foods. General instructions  Take over-the-counter and prescription medicines only as told by your health care provider.  If you have sleep apnea, surgery and certain medicines can increase your risk for breathing problems. Follow instructions from your health care provider about wearing your sleep device: ? Anytime you are sleeping, including during daytime naps. ? While taking prescription pain medicines, sleeping medicines, or medicines that make you drowsy.  If you smoke, do not smoke without supervision.  Keep all follow-up visits as told by your health care provider. This is important. Contact a health care provider if:  You keep feeling nauseous or you keep vomiting.  You feel light-headed.  You develop a rash.  You have a fever. Get help right away if:  You have trouble breathing. Summary  For several hours after your procedure, you may feel sleepy and have poor judgment.  Have a responsible adult stay with you for at least 24 hours or until you are awake and alert. This information is not intended to replace advice given to you by your health care provider. Make sure you discuss any questions you have with your health care provider. Document Revised: 04/10/2017 Document Reviewed: 05/03/2015 Elsevier Patient Education  [place].

## 2019-10-04 ENCOUNTER — Ambulatory Visit (INDEPENDENT_AMBULATORY_CARE_PROVIDER_SITE_OTHER): Payer: Medicare Other | Admitting: Family Medicine

## 2019-10-04 ENCOUNTER — Telehealth: Payer: Self-pay | Admitting: Pharmacist

## 2019-10-04 ENCOUNTER — Other Ambulatory Visit: Payer: Self-pay

## 2019-10-04 ENCOUNTER — Encounter: Payer: Self-pay | Admitting: Family Medicine

## 2019-10-04 VITALS — BP 132/83 | HR 86 | Temp 97.2°F | Ht 62.0 in | Wt 216.0 lb

## 2019-10-04 DIAGNOSIS — E1165 Type 2 diabetes mellitus with hyperglycemia: Secondary | ICD-10-CM

## 2019-10-04 DIAGNOSIS — Z794 Long term (current) use of insulin: Secondary | ICD-10-CM | POA: Diagnosis not present

## 2019-10-04 DIAGNOSIS — H938X2 Other specified disorders of left ear: Secondary | ICD-10-CM

## 2019-10-04 DIAGNOSIS — M25562 Pain in left knee: Secondary | ICD-10-CM

## 2019-10-04 LAB — CMP14+EGFR
ALT: 9 IU/L (ref 0–32)
AST: 8 IU/L (ref 0–40)
Albumin/Globulin Ratio: 1.3 (ref 1.2–2.2)
Albumin: 3.9 g/dL (ref 3.8–4.8)
Alkaline Phosphatase: 135 IU/L — ABNORMAL HIGH (ref 48–121)
BUN/Creatinine Ratio: 7 — ABNORMAL LOW (ref 12–28)
BUN: 12 mg/dL (ref 8–27)
Bilirubin Total: 0.5 mg/dL (ref 0.0–1.2)
CO2: 24 mmol/L (ref 20–29)
Calcium: 9.4 mg/dL (ref 8.7–10.3)
Chloride: 90 mmol/L — ABNORMAL LOW (ref 96–106)
Creatinine, Ser: 1.63 mg/dL — ABNORMAL HIGH (ref 0.57–1.00)
GFR calc Af Amer: 38 mL/min/{1.73_m2} — ABNORMAL LOW (ref >59)
GFR calc non Af Amer: 33 mL/min/{1.73_m2} — ABNORMAL LOW (ref >59)
Globulin, Total: 2.9 g/dL (ref 1.5–4.5)
Glucose: 402 mg/dL (ref 65–99)
Potassium: 4.3 mmol/L (ref 3.5–5.2)
Sodium: 129 mmol/L — ABNORMAL LOW (ref 134–144)
Total Protein: 6.8 g/dL (ref 6.0–8.5)

## 2019-10-04 LAB — BAYER DCA HB A1C WAIVED: HB A1C (BAYER DCA - WAIVED): 12.9 % — ABNORMAL HIGH (ref <7.0)

## 2019-10-04 MED ORDER — TRESIBA FLEXTOUCH 200 UNIT/ML ~~LOC~~ SOPN: 50.0000 [IU] | PEN_INJECTOR | Freq: Every morning | SUBCUTANEOUS | 0 refills | Status: DC

## 2019-10-04 NOTE — Telephone Encounter (Signed)
Patient is forgetting Twice daily insulin injections Transition patient to 50 units of Tresiba U-200 once daily in the AM Continue other medications as prescribed

## 2019-10-04 NOTE — Patient Instructions (Signed)
Tylenol 1,000 mg three times a day for knee pain.  Voltaren gel - over the counter - apply four times a day to the knee
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
## 2019-10-06 ENCOUNTER — Other Ambulatory Visit: Payer: Self-pay | Admitting: Family Medicine

## 2019-10-06 DIAGNOSIS — K219 Gastro-esophageal reflux disease without esophagitis: Secondary | ICD-10-CM

## 2019-10-07 ENCOUNTER — Telehealth: Payer: Self-pay | Admitting: *Deleted

## 2019-10-07 ENCOUNTER — Encounter (HOSPITAL_COMMUNITY)
Admission: RE | Admit: 2019-10-07 | Discharge: 2019-10-07 | Disposition: A | Discharge status: Home / Self Care | Payer: Medicare Other | Source: Ambulatory Visit | Attending: Internal Medicine | Admitting: Internal Medicine

## 2019-10-07 ENCOUNTER — Encounter: Payer: Self-pay | Admitting: Family Medicine

## 2019-10-07 ENCOUNTER — Encounter (HOSPITAL_COMMUNITY): Payer: Self-pay

## 2019-10-07 ENCOUNTER — Other Ambulatory Visit (HOSPITAL_COMMUNITY)
Admission: RE | Admit: 2019-10-07 | Discharge: 2019-10-07 | Disposition: A | Discharge status: Home / Self Care | Payer: Medicare Other | Source: Ambulatory Visit | Attending: Internal Medicine | Admitting: Internal Medicine

## 2019-10-07 NOTE — Telephone Encounter (Signed)
-----   Message from Naaman Plummer, RN sent at 10/07/2019  1:09 PM EDT ----- Ms. Salem did not show up for PAT at 8 am. I tried to contact her without any luck.

## 2019-10-07 NOTE — Telephone Encounter (Signed)
Called pt and LMOVM to call back. Also left message on VM procedure for tomorrow has been cancelled.

## 2019-10-08 ENCOUNTER — Encounter (HOSPITAL_COMMUNITY): Admission: RE | Payer: Self-pay | Source: Home / Self Care

## 2019-10-08 ENCOUNTER — Ambulatory Visit (HOSPITAL_COMMUNITY): Admission: RE | Admit: 2019-10-08 | Payer: Medicare Other | Source: Home / Self Care

## 2019-10-08 SURGERY — COLONOSCOPY WITH PROPOFOL
Anesthesia: Monitor Anesthesia Care

## 2019-10-08 NOTE — Telephone Encounter (Signed)
noted 

## 2019-10-08 NOTE — Telephone Encounter (Signed)
LMOVM for pt./ FYI to [facility] as why procedure was cancelled.

## 2019-10-16 ENCOUNTER — Other Ambulatory Visit: Payer: Self-pay | Admitting: *Deleted

## 2019-10-16 DIAGNOSIS — Z87898 Personal history of other specified conditions: Secondary | ICD-10-CM

## 2019-10-25 ENCOUNTER — Telehealth (INDEPENDENT_AMBULATORY_CARE_PROVIDER_SITE_OTHER): Payer: Medicare Other | Admitting: Pharmacist

## 2019-10-25 DIAGNOSIS — E119 Type 2 diabetes mellitus without complications: Secondary | ICD-10-CM

## 2019-10-25 NOTE — Progress Notes (Signed)
10/25/2019 Name: Tracy Farrell MRN: [id number] DOB: 02-May-1955   S:  51 yoF Presents for diabetes evaluation, education, and management Patient was referred and last seen by Primary Care Provider on 10/04/19.  Due to COVID-19 pandemic this visit was conducted virtually. This visit type was conducted due to national recommendations for restrictions regarding the COVID-19 Pandemic (e.g. social distancing, sheltering in place) in an effort to limit this patient's exposure and mitigate transmission in our community. All issues noted in this document were discussed and addressed.  A physical exam was not performed with this format.   I connected with Tracy Farrell on 10/25/19 at 0835 by video and verified that: I am speaking with the correct person using two identifiers. Tracy Farrell is currently located at home during visit. The provider, Tracy Farrell, PharmD is located in their office at time of visit.   I discussed the limitations, risks, security and privacy concerns of performing an evaluation and management service by telephone and the availability of in person appointments. I also discussed with the patient that there may be a patient responsible charge related to this service. The patient expressed understanding and agreed to proceed.  Insurance coverage/medication affordability: [employer] medicare  Patient reports adherence with medications. . Current diabetes medications include: tresiba, trulicity  . Current hypertension medications include: n/a (ACEi held due to kidneys per nephro) Goal 130/80 . Current hyperlipidemia medications include: atorvastatin   Patient denies hypoglycemic events.   Patient reported dietary habits: Eats 2-3 meals/day Patient reports she is not that hungry  . Breakfast:scrambled eggs/toast, bacon . Lunch: skips . Dinner: not in to protein as much . Snacks: n/a . Drinks: water, coffee  Discussed meal planning options and Plate method for healthy  eating . Avoid sugary drinks and desserts . Incorporate balanced protein, non starchy veggies, 1 serving of carbohydrate with each meal . Increase water intake . Increase physical activity as able  The patient is asked to make an attempt to improve diet and exercise patterns to aid in medical management of this problem..  Patient-reported exercise habits: n/a  Patient denies nocturia (nighttime urination). Only once  Patient reports neuropathy (nerve pain).  ON Gabapentin   Patient denies visual changes.  Patient denies self foot exams.    O:  Lab Results  Component Value Date   HGBA1C 12.9 (H) 10/04/2019    Lipid Panel     Component Value Date/Time   CHOL 142 04/01/2019 1153   TRIG 175 (H) 04/01/2019 1153   HDL 50 04/01/2019 1153   CHOLHDL 2.8 04/01/2019 1153   CHOLHDL 2.3 12/27/2018 0945   VLDL 15 12/27/2018 0945   LDLCALC 63 04/01/2019 1153    Home fasting blood sugars: not checking, encouraged patient to check BGs at least 3 times weekly fasting  2 hour post-meal/random blood sugars: not checking.   A/P:  Diabetes T2DM currently uncontrolled. Patient is adherent with medication. Control is suboptimal due to medications/diet.  -Continued basal insulin TRESIBA U-200 (insulin DEGLUDEC). 50 UNITS IN THE MORNING.  Patient states her husband makes sure she takes her insulin every morning.  This regimen is better from a compliance standpoint  Encouraged her to check BGs for [place] to know how to titrate insulin   Patient reports she does feel better overall since starting Tresiba   CONTINUE TRULICITY (GLP1) 3mg  sq weekly -->covered by medicaid to 3 mg sq weekly ? Patient denies GI side effects; tolerating well ? Denies h/o thyroid cancer  -Continue to  hold ACEi per nephro; patient has f/u in November  -Extensively discussed pathophysiology of diabetes, recommended lifestyle interventions, dietary effects on blood sugar control  -Counseled on s/sx of and  management of hypoglycemia  -Next A1C anticipated 01/23/20.  Written patient instructions provided.  Total time in face to face counseling 15 minutes.   Follow up PCP Clinic Visit ON 01/23/20   Tracy Farrell, PharmD, [place] Clinical Pharmacist, [place]  II Phone [phone]

## 2019-10-29 ENCOUNTER — Other Ambulatory Visit: Payer: Self-pay | Admitting: *Deleted

## 2019-10-29 DIAGNOSIS — Z794 Long term (current) use of insulin: Secondary | ICD-10-CM

## 2019-10-29 DIAGNOSIS — E1159 Type 2 diabetes mellitus with other circulatory complications: Secondary | ICD-10-CM

## 2019-10-29 MED ORDER — ASPIRIN EC 81 MG PO TBEC: 81.0000 mg | DELAYED_RELEASE_TABLET | Freq: Every day | ORAL | 3 refills | Status: DC

## 2019-11-10 ENCOUNTER — Other Ambulatory Visit: Payer: Self-pay | Admitting: Family Medicine

## 2019-11-10 DIAGNOSIS — Z794 Long term (current) use of insulin: Secondary | ICD-10-CM

## 2019-11-10 DIAGNOSIS — E1165 Type 2 diabetes mellitus with hyperglycemia: Secondary | ICD-10-CM

## 2019-12-16 ENCOUNTER — Other Ambulatory Visit: Payer: Self-pay | Admitting: Family Medicine

## 2019-12-16 DIAGNOSIS — Z794 Long term (current) use of insulin: Secondary | ICD-10-CM

## 2019-12-16 DIAGNOSIS — E1165 Type 2 diabetes mellitus with hyperglycemia: Secondary | ICD-10-CM

## 2019-12-18 ENCOUNTER — Encounter: Payer: Self-pay | Admitting: *Deleted

## 2019-12-26 ENCOUNTER — Other Ambulatory Visit: Payer: Self-pay | Admitting: Family Medicine

## 2019-12-26 DIAGNOSIS — K219 Gastro-esophageal reflux disease without esophagitis: Secondary | ICD-10-CM

## 2020-01-07 ENCOUNTER — Other Ambulatory Visit: Payer: Self-pay | Admitting: *Deleted

## 2020-01-07 DIAGNOSIS — E1159 Type 2 diabetes mellitus with other circulatory complications: Secondary | ICD-10-CM

## 2020-01-13 ENCOUNTER — Other Ambulatory Visit: Payer: Self-pay | Admitting: Family Medicine

## 2020-01-13 DIAGNOSIS — E1159 Type 2 diabetes mellitus with other circulatory complications: Secondary | ICD-10-CM

## 2020-01-14 ENCOUNTER — Other Ambulatory Visit: Payer: Self-pay | Admitting: Family Medicine

## 2020-01-14 DIAGNOSIS — E1159 Type 2 diabetes mellitus with other circulatory complications: Secondary | ICD-10-CM

## 2020-01-17 ENCOUNTER — Other Ambulatory Visit: Payer: Self-pay | Admitting: Family Medicine

## 2020-01-17 DIAGNOSIS — I152 Hypertension secondary to endocrine disorders: Secondary | ICD-10-CM

## 2020-01-17 DIAGNOSIS — F339 Major depressive disorder, recurrent, unspecified: Secondary | ICD-10-CM

## 2020-01-17 DIAGNOSIS — Z87898 Personal history of other specified conditions: Secondary | ICD-10-CM

## 2020-01-21 ENCOUNTER — Other Ambulatory Visit: Payer: Self-pay | Admitting: Family Medicine

## 2020-01-21 DIAGNOSIS — E1165 Type 2 diabetes mellitus with hyperglycemia: Secondary | ICD-10-CM

## 2020-01-23 ENCOUNTER — Ambulatory Visit: Payer: Medicare Other | Admitting: Family Medicine

## 2020-01-23 ENCOUNTER — Other Ambulatory Visit: Payer: Medicare Other

## 2020-01-27 ENCOUNTER — Other Ambulatory Visit: Payer: Medicare Other

## 2020-01-27 DIAGNOSIS — Z20822 Contact with and (suspected) exposure to covid-19: Secondary | ICD-10-CM

## 2020-01-28 LAB — SARS-COV-2, NAA 2 DAY TAT

## 2020-01-28 LAB — NOVEL CORONAVIRUS, NAA: SARS-CoV-2, NAA: NOT DETECTED

## 2020-01-28 LAB — SPECIMEN STATUS REPORT

## 2020-02-03 ENCOUNTER — Other Ambulatory Visit: Payer: Self-pay | Admitting: Family

## 2020-02-03 DIAGNOSIS — E1165 Type 2 diabetes mellitus with hyperglycemia: Secondary | ICD-10-CM

## 2020-02-04 ENCOUNTER — Other Ambulatory Visit: Payer: Self-pay | Admitting: Family Medicine

## 2020-02-11 ENCOUNTER — Ambulatory Visit: Payer: Medicare Other

## 2020-02-11 ENCOUNTER — Ambulatory Visit (INDEPENDENT_AMBULATORY_CARE_PROVIDER_SITE_OTHER): Payer: Medicare Other | Admitting: Family Medicine

## 2020-02-11 ENCOUNTER — Encounter: Payer: Self-pay | Admitting: Family Medicine

## 2020-02-11 ENCOUNTER — Telehealth: Payer: Self-pay | Admitting: Family Medicine

## 2020-02-11 DIAGNOSIS — E1169 Type 2 diabetes mellitus with other specified complication: Secondary | ICD-10-CM | POA: Diagnosis not present

## 2020-02-11 DIAGNOSIS — M545 Low back pain, unspecified: Secondary | ICD-10-CM

## 2020-02-11 DIAGNOSIS — G8929 Other chronic pain: Secondary | ICD-10-CM

## 2020-02-11 DIAGNOSIS — D509 Iron deficiency anemia, unspecified: Secondary | ICD-10-CM

## 2020-02-11 DIAGNOSIS — E559 Vitamin D deficiency, unspecified: Secondary | ICD-10-CM | POA: Insufficient documentation

## 2020-02-11 DIAGNOSIS — Z8673 Personal history of transient ischemic attack (TIA), and cerebral infarction without residual deficits: Secondary | ICD-10-CM

## 2020-02-11 DIAGNOSIS — E1165 Type 2 diabetes mellitus with hyperglycemia: Secondary | ICD-10-CM | POA: Diagnosis not present

## 2020-02-11 DIAGNOSIS — Z794 Long term (current) use of insulin: Secondary | ICD-10-CM

## 2020-02-11 DIAGNOSIS — Z0000 Encounter for general adult medical examination without abnormal findings: Secondary | ICD-10-CM

## 2020-02-11 DIAGNOSIS — E785 Hyperlipidemia, unspecified: Secondary | ICD-10-CM

## 2020-02-11 DIAGNOSIS — G2581 Restless legs syndrome: Secondary | ICD-10-CM | POA: Diagnosis not present

## 2020-02-11 DIAGNOSIS — N184 Chronic kidney disease, stage 4 (severe): Secondary | ICD-10-CM | POA: Diagnosis not present

## 2020-02-11 MED ORDER — GABAPENTIN 300 MG PO CAPS: 300.0000 mg | ORAL_CAPSULE | Freq: Three times a day (TID) | ORAL | 1 refills | Status: DC

## 2020-02-11 MED ORDER — ATORVASTATIN CALCIUM 40 MG PO TABS: 40.0000 mg | ORAL_TABLET | Freq: Every day | ORAL | 1 refills | Status: DC

## 2020-02-11 MED ORDER — ROPINIROLE HCL 1 MG PO TABS: 1.0000 mg | ORAL_TABLET | Freq: Every day | ORAL | 1 refills | Status: DC

## 2020-02-11 MED ORDER — TRESIBA FLEXTOUCH 200 UNIT/ML ~~LOC~~ SOPN: 50.0000 [IU] | PEN_INJECTOR | Freq: Every morning | SUBCUTANEOUS | 2 refills | Status: DC

## 2020-02-11 MED ORDER — TRULICITY 3 MG/0.5ML ~~LOC~~ SOAJ: 3.0000 mg | SUBCUTANEOUS | 0 refills | Status: DC

## 2020-02-11 MED ORDER — LISINOPRIL 2.5 MG PO TABS: 2.5000 mg | ORAL_TABLET | Freq: Every day | ORAL | 2 refills | Status: DC

## 2020-02-11 NOTE — Progress Notes (Addendum)
Virtual Visit via Telephone Note  I connected with Tracy Farrell on 02/11/20 at 8:47 AM by telephone and verified that I am speaking with the correct person using two identifiers. Tracy Farrell is currently located at home and her husband is currently with her during this visit. The provider, Loman Brooklyn, FNP is located in their home at time of visit.  I discussed the limitations, risks, security and privacy concerns of performing an evaluation and management service by telephone and the availability of in person appointments. I also discussed with the patient that there may be a patient responsible charge related to this service. The patient expressed understanding and agreed to proceed.  Subjective: PCP: Loman Brooklyn, FNP  Chief Complaint  Patient presents with  . Diabetes   Diabetes: Patient presents for follow up of diabetes. Current symptoms include: none. Known diabetic complications: cardiovascular disease and cerebrovascular disease. Medication compliance: yes. Current diet: in general, a "healthy" diet  . Current exercise: none. Home blood sugar records: BGs range between 110 and 130. Is she  on ACE inhibitor or angiotensin II receptor blocker? No. Is she on a statin? Yes.   Lab Results  Component Value Date   HGBA1C 12.9 (H) 10/04/2019   HGBA1C 9.2 (H) 07/04/2019   HGBA1C 7.7 (H) 04/01/2019   Lab Results  Component Value Date   LDLCALC 63 04/01/2019   CREATININE 1.63 (H) 10/04/2019     Back pain: Patient reports low back and left leg pain that has been going on for many years.  She reports at times it becomes excruciating.  She does not take anything for the pain as she reports it will calm down if she goes to lay down for about an hour.  She knows it is going to come on if she was going to the grocery store or somewhere where she has done a lot of walking.  She believes it to be EKG of heart rate she had many years ago.   ROS: Per HPI  Current Outpatient  Medications:  .  aspirin EC 81 MG tablet, Take 1 tablet (81 mg total) by mouth daily., Disp: 90 tablet, Rfl: 3 .  atorvastatin (LIPITOR) 40 MG tablet, Take 1 tablet (40 mg total) by mouth daily at 6 PM., Disp: 90 tablet, Rfl: 1 .  cholecalciferol (VITAMIN D3) 25 MCG (1000 UNIT) tablet, Take 1,000 Units by mouth daily., Disp: , Rfl:  .  citalopram (CELEXA) 20 MG tablet, Take 1 tablet by mouth once daily, Disp: 90 tablet, Rfl: 0 .  Dulaglutide (TRULICITY) 3 KA/7.6OT SOPN, INJECT 0.5 MLS (3MG TOTAL) AS DIRECTED ONCE A WEEK, Disp: 4 mL, Rfl: 0 .  Ferrous Sulfate (IRON PO), Take by mouth daily. Patient unsure of dose, Disp: , Rfl:  .  furosemide (LASIX) 40 MG tablet, Take 1 tablet by mouth once daily, Disp: 90 tablet, Rfl: 0 .  gabapentin (NEURONTIN) 300 MG capsule, Take 1 capsule (300 mg total) by mouth 3 (three) times daily., Disp: 270 capsule, Rfl: 1 .  glucose blood test strip, Use as instructed, Disp: 100 each, Rfl: 12 .  Insulin Pen Needle 32G X 4 MM MISC, 1 application by Does not apply route in the morning, at noon, in the evening, and at bedtime., Disp: 1000 each, Rfl: 3 .  levETIRAcetam (KEPPRA) 500 MG tablet, Take 1 tablet by mouth twice daily, Disp: 180 tablet, Rfl: 0 .  magnesium oxide (MAG-OX) 400 MG tablet, Take 400 mg by mouth  daily., Disp: , Rfl:  .  pantoprazole (PROTONIX) 20 MG tablet, Take 1 tablet by mouth once daily, Disp: 90 tablet, Rfl: 0 .  rOPINIRole (REQUIP) 1 MG tablet, Take 1 tablet (1 mg total) by mouth at bedtime., Disp: 90 tablet, Rfl: 1 .  TRESIBA FLEXTOUCH 200 UNIT/ML FlexTouch Pen, INJECT 50 UNITS SUBCUTANEOUSLY ONCE DAILY IN THE MORNING, Disp: 9 mL, Rfl: 0  Allergies  Allergen Reactions  . Sulfa Antibiotics Other (See Comments)    UNKNOWN REACTION   Past Medical History:  Diagnosis Date  . Arthritis   . Carpal tunnel syndrome, bilateral   . Chronic back pain   . CKD (chronic kidney disease) stage 4, GFR 15-29 ml/min (HCC) 07/07/2019  . Depression   .  Diabetes mellitus   . GERD (gastroesophageal reflux disease)   . Headache(784.0)   . Hyperlipemia   . Hypertension   . Hypomagnesemia 07/07/2019  . RLS (restless legs syndrome)   . Stroke [facility])     Observations/Objective: A&O  No respiratory distress or wheezing audible over the phone Mood, judgement, and thought processes all WNL   Assessment and Plan: 1. Uncontrolled type 2 diabetes mellitus with hyperglycemia, with long-term current use of insulin (HCC) Lab Results  Component Value Date   HGBA1C 12.9 (H) 10/04/2019   HGBA1C 9.2 (H) 07/04/2019   HGBA1C 7.7 (H) 04/01/2019   - Patient will come for labs. - Medications: continue current medications until labs result - Home glucose monitoring: continue monitoring - Patient is currently taking a statin. Patient is taking an ACE-inhibitor/ARB.  - Last foot exam: 01/16/2019 - unable to perform today as this was a telephone visit due to inclement weather - Last diabetic eye exam: 05/29/2017 - Urine Microalbumin/Creat Ratio: 10/23/2018 - Instruction/counseling given: reminded to get eye exam - Microalbumin / creatinine urine ratio; Future - Lipid panel; Future - CMP14+EGFR; Future - Bayer DCA Hb A1c Waived; Future - lisinopril (ZESTRIL) 2.5 MG tablet; Take 1 tablet (2.5 mg total) by mouth daily.  Dispense: 30 tablet; Refill: 2 - atorvastatin (LIPITOR) 40 MG tablet; Take 1 tablet (40 mg total) by mouth daily at 6 PM.  Dispense: 90 tablet; Refill: 1 - Dulaglutide (TRULICITY) 3 DD/2.2GU SOPN; Inject 3 mg as directed once a week.  Dispense: 6 mL; Refill: 0 - insulin degludec (TRESIBA FLEXTOUCH) 200 UNIT/ML FlexTouch Pen; Inject 50 Units into the skin in the morning.  Dispense: 9 mL; Refill: 2  2. Hyperlipidemia associated with type 2 diabetes mellitus ([place]) - Labs to assess. Continue statin. - Lipid panel; Future - CMP14+EGFR; Future - atorvastatin (LIPITOR) 40 MG tablet; Take 1 tablet (40 mg total) by mouth daily at 6 PM.  Dispense: 90  tablet; Refill: 1  3. History of CVA (cerebrovascular accident) - Continue ASA and statin. - atorvastatin (LIPITOR) 40 MG tablet; Take 1 tablet (40 mg total) by mouth daily at 6 PM.  Dispense: 90 tablet; Refill: 1  4. Restless leg syndrome - Well controlled on current regimen.  - CMP14+EGFR; Future - gabapentin (NEURONTIN) 300 MG capsule; Take 1 capsule (300 mg total) by mouth 3 (three) times daily.  Dispense: 270 capsule; Refill: 1 - rOPINIRole (REQUIP) 1 MG tablet; Take 1 tablet (1 mg total) by mouth at bedtime.  Dispense: 90 tablet; Refill: 1  5. CKD (chronic kidney disease) stage 4, GFR 15-29 ml/min (HCC) - Encouraged patient to schedule f/u with nephrologist. Discussed avoidance of all NSAIDs.  - CMP14+EGFR; Future  6. Iron deficiency anemia, unspecified iron deficiency  anemia type - Encouraged patient to re-schedule appointment with GI for colonoscopy and EGD. - CBC with Differential/Platelet; Future  7. Hypomagnesemia - Labs to assess. - Magnesium; Future  8. Vitamin D deficiency - Labs to assess. - VITAMIN D 25 Hydroxy (Vit-D Deficiency, Fractures); Future  9. Healthcare maintenance - We will assist patient in getting mammogram and DEXA scheduled. She will come for a nurse visit for flu and TDAP. Patient to schedule eye exam. Encouraged f/u with GI for colonoscopy.   10. Chronic low back pain without sciatica, unspecified back pain laterality - Encouraged to try Tylenol when pain occurs or prior to onset of pain since she does know anything that involves lots of walking will bring on the pain. Advised to avoid all NSAIDs due to CKD.   Follow Up Instructions: Return as directed after labs result.  I discussed the assessment and treatment plan with the patient. The patient was provided an opportunity to ask questions and all were answered. The patient agreed with the plan and demonstrated an understanding of the instructions.   The patient was advised to call back or  seek an in-person evaluation if the symptoms worsen or if the condition fails to improve as anticipated.  The above assessment and management plan was discussed with the patient. The patient verbalized understanding of and has agreed to the management plan. Patient is aware to call the clinic if symptoms persist or worsen. Patient is aware when to return to the clinic for a follow-up visit. Patient educated on when it is appropriate to go to the emergency department.   Time call ended: 9:27 AM  I provided 40 minutes of non-face-to-face time during this encounter.  Hendricks Limes, MSN, APRN, FNP-C [facility] 02/11/20

## 2020-02-11 NOTE — Telephone Encounter (Signed)
Please call patient and schedule a mammogram on the bus.   Also, please let her know if she can get her DEXA scan when she comes for lab work.   Last thing, please schedule a nurse visit for TDAP & flu vaccines.

## 2020-02-12 ENCOUNTER — Other Ambulatory Visit: Payer: Self-pay | Admitting: Family Medicine

## 2020-02-12 DIAGNOSIS — Z1231 Encounter for screening mammogram for malignant neoplasm of breast: Secondary | ICD-10-CM

## 2020-02-12 NOTE — Telephone Encounter (Signed)
Appts made

## 2020-03-04 ENCOUNTER — Other Ambulatory Visit: Payer: Medicare Other

## 2020-03-04 ENCOUNTER — Other Ambulatory Visit: Payer: Self-pay

## 2020-03-04 DIAGNOSIS — E559 Vitamin D deficiency, unspecified: Secondary | ICD-10-CM

## 2020-03-04 DIAGNOSIS — D509 Iron deficiency anemia, unspecified: Secondary | ICD-10-CM

## 2020-03-04 DIAGNOSIS — N184 Chronic kidney disease, stage 4 (severe): Secondary | ICD-10-CM

## 2020-03-04 DIAGNOSIS — E1165 Type 2 diabetes mellitus with hyperglycemia: Secondary | ICD-10-CM | POA: Diagnosis not present

## 2020-03-04 DIAGNOSIS — Z794 Long term (current) use of insulin: Secondary | ICD-10-CM

## 2020-03-04 DIAGNOSIS — G2581 Restless legs syndrome: Secondary | ICD-10-CM

## 2020-03-04 DIAGNOSIS — E785 Hyperlipidemia, unspecified: Secondary | ICD-10-CM

## 2020-03-04 LAB — BAYER DCA HB A1C WAIVED: HB A1C (BAYER DCA - WAIVED): 7.7 % — ABNORMAL HIGH (ref <7.0)

## 2020-03-05 ENCOUNTER — Other Ambulatory Visit: Payer: Self-pay | Admitting: Family Medicine

## 2020-03-05 DIAGNOSIS — E1165 Type 2 diabetes mellitus with hyperglycemia: Secondary | ICD-10-CM

## 2020-03-05 DIAGNOSIS — Z794 Long term (current) use of insulin: Secondary | ICD-10-CM

## 2020-03-05 DIAGNOSIS — N184 Chronic kidney disease, stage 4 (severe): Secondary | ICD-10-CM

## 2020-03-05 LAB — CMP14+EGFR
ALT: 13 IU/L (ref 0–32)
AST: 11 IU/L (ref 0–40)
Albumin/Globulin Ratio: 1.3 (ref 1.2–2.2)
Albumin: 3.7 g/dL — ABNORMAL LOW (ref 3.8–4.8)
Alkaline Phosphatase: 129 IU/L — ABNORMAL HIGH (ref 44–121)
BUN/Creatinine Ratio: 9 — ABNORMAL LOW (ref 12–28)
BUN: 13 mg/dL (ref 8–27)
Bilirubin Total: 0.4 mg/dL (ref 0.0–1.2)
CO2: 27 mmol/L (ref 20–29)
Calcium: 9.3 mg/dL (ref 8.7–10.3)
Chloride: 100 mmol/L (ref 96–106)
Creatinine, Ser: 1.47 mg/dL — ABNORMAL HIGH (ref 0.57–1.00)
GFR calc Af Amer: 43 mL/min/{1.73_m2} — ABNORMAL LOW (ref >59)
GFR calc non Af Amer: 37 mL/min/{1.73_m2} — ABNORMAL LOW (ref >59)
Globulin, Total: 2.9 g/dL (ref 1.5–4.5)
Glucose: 116 mg/dL — ABNORMAL HIGH (ref 65–99)
Potassium: 5 mmol/L (ref 3.5–5.2)
Sodium: 142 mmol/L (ref 134–144)
Total Protein: 6.6 g/dL (ref 6.0–8.5)

## 2020-03-05 LAB — LIPID PANEL
Chol/HDL Ratio: 3.5 ratio (ref 0.0–4.4)
Cholesterol, Total: 115 mg/dL (ref 100–199)
HDL: 33 mg/dL — ABNORMAL LOW (ref >39)
LDL Chol Calc (NIH): 61 mg/dL (ref 0–99)
Triglycerides: 111 mg/dL (ref 0–149)
VLDL Cholesterol Cal: 21 mg/dL (ref 5–40)

## 2020-03-05 LAB — MICROALBUMIN / CREATININE URINE RATIO
Creatinine, Urine: 209.5 mg/dL
Microalb/Creat Ratio: 19 mg/g creat (ref 0–29)
Microalbumin, Urine: 40 ug/mL

## 2020-03-05 LAB — CBC WITH DIFFERENTIAL/PLATELET
Basophils Absolute: 0 10*3/uL (ref 0.0–0.2)
Basos: 0 %
EOS (ABSOLUTE): 0.2 10*3/uL (ref 0.0–0.4)
Eos: 3 %
Hematocrit: 36.1 % (ref 34.0–46.6)
Hemoglobin: 11.9 g/dL (ref 11.1–15.9)
Immature Grans (Abs): 0 10*3/uL (ref 0.0–0.1)
Immature Granulocytes: 0 %
Lymphocytes Absolute: 1.6 10*3/uL (ref 0.7–3.1)
Lymphs: 20 %
MCH: 28.7 pg (ref 26.6–33.0)
MCHC: 33 g/dL (ref 31.5–35.7)
MCV: 87 fL (ref 79–97)
Monocytes Absolute: 0.5 10*3/uL (ref 0.1–0.9)
Monocytes: 7 %
Neutrophils Absolute: 5.8 10*3/uL (ref 1.4–7.0)
Neutrophils: 70 %
Platelets: 323 10*3/uL (ref 150–450)
RBC: 4.15 x10E6/uL (ref 3.77–5.28)
RDW: 14.1 % (ref 11.7–15.4)
WBC: 8.1 10*3/uL (ref 3.4–10.8)

## 2020-03-05 LAB — VITAMIN D 25 HYDROXY (VIT D DEFICIENCY, FRACTURES): Vit D, 25-Hydroxy: 33.1 ng/mL (ref 30.0–100.0)

## 2020-03-05 LAB — MAGNESIUM: Magnesium: 2.1 mg/dL (ref 1.6–2.3)

## 2020-03-05 MED ORDER — DAPAGLIFLOZIN PROPANEDIOL 5 MG PO TABS: 5.0000 mg | ORAL_TABLET | Freq: Every day | ORAL | 2 refills | Status: DC

## 2020-03-18 ENCOUNTER — Other Ambulatory Visit: Payer: Self-pay

## 2020-03-18 ENCOUNTER — Ambulatory Visit (INDEPENDENT_AMBULATORY_CARE_PROVIDER_SITE_OTHER): Payer: Medicare Other

## 2020-03-18 ENCOUNTER — Ambulatory Visit
Admission: RE | Admit: 2020-03-18 | Discharge: 2020-03-18 | Disposition: A | Discharge status: Home / Self Care | Payer: Medicare Other | Source: Ambulatory Visit | Attending: Family Medicine | Admitting: Family Medicine

## 2020-03-18 DIAGNOSIS — Z1231 Encounter for screening mammogram for malignant neoplasm of breast: Secondary | ICD-10-CM | POA: Diagnosis not present

## 2020-03-18 DIAGNOSIS — Z780 Asymptomatic menopausal state: Secondary | ICD-10-CM | POA: Diagnosis not present

## 2020-03-20 DIAGNOSIS — Z13820 Encounter for screening for osteoporosis: Secondary | ICD-10-CM | POA: Diagnosis not present

## 2020-03-20 DIAGNOSIS — Z780 Asymptomatic menopausal state: Secondary | ICD-10-CM | POA: Diagnosis not present

## 2020-03-21 ENCOUNTER — Other Ambulatory Visit: Payer: Self-pay | Admitting: Nurse Practitioner

## 2020-03-21 DIAGNOSIS — K219 Gastro-esophageal reflux disease without esophagitis: Secondary | ICD-10-CM

## 2020-03-30 ENCOUNTER — Other Ambulatory Visit: Payer: Self-pay | Admitting: Family Medicine

## 2020-03-30 DIAGNOSIS — E1165 Type 2 diabetes mellitus with hyperglycemia: Secondary | ICD-10-CM

## 2020-04-06 ENCOUNTER — Other Ambulatory Visit: Payer: Self-pay | Admitting: Nurse Practitioner

## 2020-04-06 DIAGNOSIS — Z794 Long term (current) use of insulin: Secondary | ICD-10-CM

## 2020-04-06 DIAGNOSIS — E1165 Type 2 diabetes mellitus with hyperglycemia: Secondary | ICD-10-CM

## 2020-04-10 ENCOUNTER — Other Ambulatory Visit: Payer: Self-pay | Admitting: Family Medicine

## 2020-04-10 DIAGNOSIS — Z794 Long term (current) use of insulin: Secondary | ICD-10-CM

## 2020-04-10 DIAGNOSIS — F339 Major depressive disorder, recurrent, unspecified: Secondary | ICD-10-CM

## 2020-04-10 DIAGNOSIS — E1165 Type 2 diabetes mellitus with hyperglycemia: Secondary | ICD-10-CM

## 2020-05-02 ENCOUNTER — Other Ambulatory Visit: Payer: Self-pay | Admitting: Family Medicine

## 2020-05-02 DIAGNOSIS — E1165 Type 2 diabetes mellitus with hyperglycemia: Secondary | ICD-10-CM

## 2020-05-02 DIAGNOSIS — Z794 Long term (current) use of insulin: Secondary | ICD-10-CM

## 2020-05-03 ENCOUNTER — Other Ambulatory Visit: Payer: Self-pay | Admitting: Family Medicine

## 2020-05-03 DIAGNOSIS — Z87898 Personal history of other specified conditions: Secondary | ICD-10-CM

## 2020-05-04 NOTE — Telephone Encounter (Signed)
Last office visit 02/11/20 Upcoming appointment 05/12/20 Last refill 01/20/20, #180, no refills

## 2020-05-09 ENCOUNTER — Other Ambulatory Visit: Payer: Self-pay | Admitting: Family Medicine

## 2020-05-09 DIAGNOSIS — E1159 Type 2 diabetes mellitus with other circulatory complications: Secondary | ICD-10-CM

## 2020-05-12 ENCOUNTER — Ambulatory Visit (INDEPENDENT_AMBULATORY_CARE_PROVIDER_SITE_OTHER): Payer: Medicare Other | Admitting: Family Medicine

## 2020-05-12 ENCOUNTER — Other Ambulatory Visit: Payer: Self-pay

## 2020-05-12 ENCOUNTER — Encounter: Payer: Self-pay | Admitting: Family Medicine

## 2020-05-12 VITALS — BP 138/78 | HR 77 | Temp 96.7°F | Ht 62.0 in | Wt 224.2 lb

## 2020-05-12 DIAGNOSIS — G2581 Restless legs syndrome: Secondary | ICD-10-CM

## 2020-05-12 DIAGNOSIS — Z23 Encounter for immunization: Secondary | ICD-10-CM

## 2020-05-12 DIAGNOSIS — I152 Hypertension secondary to endocrine disorders: Secondary | ICD-10-CM

## 2020-05-12 DIAGNOSIS — E1159 Type 2 diabetes mellitus with other circulatory complications: Secondary | ICD-10-CM | POA: Diagnosis not present

## 2020-05-12 DIAGNOSIS — R5383 Other fatigue: Secondary | ICD-10-CM | POA: Diagnosis not present

## 2020-05-12 DIAGNOSIS — N184 Chronic kidney disease, stage 4 (severe): Secondary | ICD-10-CM

## 2020-05-12 DIAGNOSIS — Z794 Long term (current) use of insulin: Secondary | ICD-10-CM

## 2020-05-12 DIAGNOSIS — E1165 Type 2 diabetes mellitus with hyperglycemia: Secondary | ICD-10-CM | POA: Diagnosis not present

## 2020-05-12 DIAGNOSIS — F339 Major depressive disorder, recurrent, unspecified: Secondary | ICD-10-CM | POA: Insufficient documentation

## 2020-05-12 MED ORDER — CITALOPRAM HYDROBROMIDE 40 MG PO TABS
40.0000 mg | ORAL_TABLET | Freq: Every day | ORAL | 2 refills | Status: DC
Start: 2020-05-12 — End: 2020-09-03

## 2020-05-12 MED ORDER — ROPINIROLE HCL 1 MG PO TABS: 2.0000 mg | ORAL_TABLET | Freq: Every day | ORAL | 1 refills | Status: DC

## 2020-05-12 NOTE — Patient Instructions (Signed)
Please schedule appointment for your diabetic eye exam and a colonoscopy.
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
## 2020-05-17 ENCOUNTER — Other Ambulatory Visit: Payer: Self-pay | Admitting: Family Medicine

## 2020-05-17 DIAGNOSIS — E1165 Type 2 diabetes mellitus with hyperglycemia: Secondary | ICD-10-CM

## 2020-05-17 DIAGNOSIS — N184 Chronic kidney disease, stage 4 (severe): Secondary | ICD-10-CM

## 2020-05-25 ENCOUNTER — Encounter: Payer: Medicare Other | Admitting: Family Medicine

## 2020-05-26 ENCOUNTER — Encounter: Payer: Self-pay | Admitting: Family Medicine

## 2020-05-27 ENCOUNTER — Other Ambulatory Visit: Payer: Self-pay | Admitting: Family Medicine

## 2020-05-27 DIAGNOSIS — E1165 Type 2 diabetes mellitus with hyperglycemia: Secondary | ICD-10-CM

## 2020-06-03 ENCOUNTER — Encounter: Payer: Self-pay | Admitting: Family Medicine

## 2020-06-03 ENCOUNTER — Other Ambulatory Visit: Payer: Self-pay

## 2020-06-03 ENCOUNTER — Ambulatory Visit (INDEPENDENT_AMBULATORY_CARE_PROVIDER_SITE_OTHER): Payer: Medicare Other | Admitting: Family Medicine

## 2020-06-03 VITALS — BP 138/79 | HR 76 | Temp 97.0°F | Ht 62.0 in | Wt 225.0 lb

## 2020-06-03 DIAGNOSIS — Z1211 Encounter for screening for malignant neoplasm of colon: Secondary | ICD-10-CM | POA: Diagnosis not present

## 2020-06-03 DIAGNOSIS — Z794 Long term (current) use of insulin: Secondary | ICD-10-CM | POA: Diagnosis not present

## 2020-06-03 DIAGNOSIS — E1159 Type 2 diabetes mellitus with other circulatory complications: Secondary | ICD-10-CM | POA: Diagnosis not present

## 2020-06-03 DIAGNOSIS — R6889 Other general symptoms and signs: Secondary | ICD-10-CM | POA: Diagnosis not present

## 2020-06-03 DIAGNOSIS — Z8673 Personal history of transient ischemic attack (TIA), and cerebral infarction without residual deficits: Secondary | ICD-10-CM | POA: Diagnosis not present

## 2020-06-03 DIAGNOSIS — K219 Gastro-esophageal reflux disease without esophagitis: Secondary | ICD-10-CM | POA: Diagnosis not present

## 2020-06-03 DIAGNOSIS — E1165 Type 2 diabetes mellitus with hyperglycemia: Secondary | ICD-10-CM | POA: Diagnosis not present

## 2020-06-03 DIAGNOSIS — R5383 Other fatigue: Secondary | ICD-10-CM

## 2020-06-03 DIAGNOSIS — N184 Chronic kidney disease, stage 4 (severe): Secondary | ICD-10-CM

## 2020-06-03 DIAGNOSIS — I152 Hypertension secondary to endocrine disorders: Secondary | ICD-10-CM

## 2020-06-03 DIAGNOSIS — E785 Hyperlipidemia, unspecified: Secondary | ICD-10-CM

## 2020-06-03 DIAGNOSIS — E1169 Type 2 diabetes mellitus with other specified complication: Secondary | ICD-10-CM | POA: Diagnosis not present

## 2020-06-03 DIAGNOSIS — G2581 Restless legs syndrome: Secondary | ICD-10-CM

## 2020-06-03 LAB — BAYER DCA HB A1C WAIVED: HB A1C (BAYER DCA - WAIVED): 7 % — ABNORMAL HIGH (ref <7.0)

## 2020-06-03 MED ORDER — GABAPENTIN 300 MG PO CAPS: ORAL_CAPSULE | ORAL | 1 refills | Status: DC

## 2020-06-03 MED ORDER — DAPAGLIFLOZIN PROPANEDIOL 10 MG PO TABS: 10.0000 mg | ORAL_TABLET | Freq: Every day | ORAL | 1 refills | Status: DC

## 2020-06-03 MED ORDER — DAPAGLIFLOZIN PROPANEDIOL 5 MG PO TABS: 5.0000 mg | ORAL_TABLET | Freq: Every day | ORAL | 1 refills | Status: DC

## 2020-06-03 MED ORDER — LISINOPRIL 2.5 MG PO TABS: 2.5000 mg | ORAL_TABLET | Freq: Every day | ORAL | 1 refills | Status: DC

## 2020-06-03 MED ORDER — PANTOPRAZOLE SODIUM 20 MG PO TBEC: 20.0000 mg | DELAYED_RELEASE_TABLET | Freq: Every day | ORAL | 1 refills | Status: DC

## 2020-06-03 MED ORDER — ATORVASTATIN CALCIUM 40 MG PO TABS: 40.0000 mg | ORAL_TABLET | Freq: Every day | ORAL | 1 refills | Status: DC
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
## 2020-06-03 NOTE — Patient Instructions (Signed)
Decrease Requip to 1 mg at bedtime x1 week, then 0.5 mg at bedtime x1 week, then 0.5 mg every other bedtime x1 week, then stop.   Increase Farxiga from 5 mg to 10 mg once daily.  Continue Gabapentin 300 mg in the morning and afternoon, but increase to 600 mg at bedtime.   Tylenol 1,000 mg three times per day as needed for pain.

## 2020-06-04 ENCOUNTER — Encounter: Payer: Self-pay | Admitting: Family Medicine

## 2020-06-04 ENCOUNTER — Other Ambulatory Visit: Payer: Self-pay | Admitting: Family Medicine

## 2020-06-04 DIAGNOSIS — E538 Deficiency of other specified B group vitamins: Secondary | ICD-10-CM

## 2020-06-04 DIAGNOSIS — N184 Chronic kidney disease, stage 4 (severe): Secondary | ICD-10-CM

## 2020-06-04 HISTORY — DX: Deficiency of other specified B group vitamins: E53.8

## 2020-06-04 LAB — ANEMIA PROFILE B
Basophils Absolute: 0 10*3/uL (ref 0.0–0.2)
Basos: 0 %
EOS (ABSOLUTE): 0.2 10*3/uL (ref 0.0–0.4)
Eos: 3 %
Ferritin: 121 ng/mL (ref 15–150)
Folate: 8.9 ng/mL (ref >3.0)
Hematocrit: 36.5 % (ref 34.0–46.6)
Hemoglobin: 12 g/dL (ref 11.1–15.9)
Immature Grans (Abs): 0 10*3/uL (ref 0.0–0.1)
Immature Granulocytes: 0 %
Iron Saturation: 17 % (ref 15–55)
Iron: 52 ug/dL (ref 27–139)
Lymphocytes Absolute: 1.7 10*3/uL (ref 0.7–3.1)
Lymphs: 21 %
MCH: 28.8 pg (ref 26.6–33.0)
MCHC: 32.9 g/dL (ref 31.5–35.7)
MCV: 88 fL (ref 79–97)
Monocytes Absolute: 0.5 10*3/uL (ref 0.1–0.9)
Monocytes: 6 %
Neutrophils Absolute: 5.8 10*3/uL (ref 1.4–7.0)
Neutrophils: 70 %
Platelets: 323 10*3/uL (ref 150–450)
RBC: 4.17 x10E6/uL (ref 3.77–5.28)
RDW: 14 % (ref 11.7–15.4)
Retic Ct Pct: 1.6 % (ref 0.6–2.6)
Total Iron Binding Capacity: 311 ug/dL (ref 250–450)
UIBC: 259 ug/dL (ref 118–369)
Vitamin B-12: 140 pg/mL — ABNORMAL LOW (ref 232–1245)
WBC: 8.2 10*3/uL (ref 3.4–10.8)

## 2020-06-04 LAB — TSH: TSH: 4.27 u[IU]/mL (ref 0.450–4.500)

## 2020-06-04 LAB — LIPID PANEL
Chol/HDL Ratio: 3.9 ratio (ref 0.0–4.4)
Cholesterol, Total: 143 mg/dL (ref 100–199)
HDL: 37 mg/dL — ABNORMAL LOW (ref >39)
LDL Chol Calc (NIH): 84 mg/dL (ref 0–99)
Triglycerides: 123 mg/dL (ref 0–149)
VLDL Cholesterol Cal: 22 mg/dL (ref 5–40)

## 2020-06-04 LAB — CMP14+EGFR
ALT: 10 IU/L (ref 0–32)
AST: 11 IU/L (ref 0–40)
Albumin/Globulin Ratio: 1.9 (ref 1.2–2.2)
Albumin: 4.2 g/dL (ref 3.8–4.8)
Alkaline Phosphatase: 122 IU/L — ABNORMAL HIGH (ref 44–121)
BUN/Creatinine Ratio: 10 — ABNORMAL LOW (ref 12–28)
BUN: 20 mg/dL (ref 8–27)
Bilirubin Total: 0.4 mg/dL (ref 0.0–1.2)
CO2: 26 mmol/L (ref 20–29)
Calcium: 9.7 mg/dL (ref 8.7–10.3)
Chloride: 94 mmol/L — ABNORMAL LOW (ref 96–106)
Creatinine, Ser: 1.97 mg/dL — ABNORMAL HIGH (ref 0.57–1.00)
Globulin, Total: 2.2 g/dL (ref 1.5–4.5)
Glucose: 141 mg/dL — ABNORMAL HIGH (ref 65–99)
Potassium: 5 mmol/L (ref 3.5–5.2)
Sodium: 133 mmol/L — ABNORMAL LOW (ref 134–144)
Total Protein: 6.4 g/dL (ref 6.0–8.5)
eGFR: 28 mL/min/{1.73_m2} — ABNORMAL LOW (ref >59)

## 2020-06-04 LAB — HM DIABETES EYE EXAM

## 2020-06-18 ENCOUNTER — Encounter: Payer: Self-pay | Admitting: Family Medicine

## 2020-06-18 ENCOUNTER — Ambulatory Visit (INDEPENDENT_AMBULATORY_CARE_PROVIDER_SITE_OTHER): Payer: Medicare Other | Admitting: Family Medicine

## 2020-06-18 ENCOUNTER — Other Ambulatory Visit: Payer: Self-pay

## 2020-06-18 VITALS — BP 131/72 | HR 74 | Temp 97.3°F | Ht 62.0 in | Wt 223.2 lb

## 2020-06-18 DIAGNOSIS — Z0000 Encounter for general adult medical examination without abnormal findings: Secondary | ICD-10-CM

## 2020-06-18 DIAGNOSIS — Z23 Encounter for immunization: Secondary | ICD-10-CM

## 2020-06-18 DIAGNOSIS — R159 Full incontinence of feces: Secondary | ICD-10-CM

## 2020-06-18 NOTE — Patient Instructions (Signed)
Tracy Farrell , Thank you for taking time to come for your Medicare Wellness Visit. I appreciate your ongoing commitment to your health goals. Please review the following plan we discussed and let me know if I can assist you in the future.   These are the goals we discussed: Goals    . Urinary Incontinence Identified     Participate in pelvic health physical therapy to improve urinary incontinence.        This is a list of the screening recommended for you and due dates:  Health Maintenance  Topic Date Due  . Eye exam for diabetics  05/30/2018  . Colon Cancer Screening  10/03/2020*  . Pneumonia vaccines (2 of 2 - PPSV23) 07/03/2020  . Flu Shot  08/24/2020  . Hemoglobin A1C  12/04/2020  . Mammogram  03/18/2021  . Complete foot exam   05/12/2021  . DEXA scan (bone density measurement)  03/20/2022  . Tetanus Vaccine  05/13/2030  . COVID-19 Vaccine  Completed  . Hepatitis C Screening: USPSTF Recommendation to screen - Ages 59-79 yo.  Completed  . HIV Screening  Completed  . HPV Vaccine  Aged Out  *Topic was postponed. The date shown is not the original due date.     Advance Directive Advance directives are legal documents that allow you to make decisions about your health care and medical treatment in case you become unable to communicate for yourself. Advance directives let your wishes be known to family, friends, and health care providers. Discussing and writing advance directives should happen over time rather than all at once. Advance directives can be changed and updated at any time. There are different types of advance directives, such as:  Medical power of attorney.  Living will.  Do not resuscitate (DNR) order or do not attempt resuscitation (DNAR) order. Health care proxy and medical power of attorney A health care proxy is also called a health care agent. This person is appointed to make medical decisions for you when you are unable to make decisions for yourself. Generally,  people ask a trusted friend or family member to act as their proxy and represent their preferences. Make sure you have an agreement with your trusted person to act as your proxy. A proxy may have to make a medical decision on your behalf if your wishes are not known. A medical power of attorney, also called a durable power of attorney for health care, is a legal document that names your health care proxy. Depending on the laws in your state, the document may need to be:  Signed.  Notarized.  Dated.  Copied.  Witnessed.  Incorporated into your medical record. You may also want to appoint a trusted person to manage your money in the event you are unable to do so. This is called a durable power of attorney for finances. It is a separate legal document from the durable power of attorney for health care. You may choose your health care proxy or someone different to act as your agent in money matters. If you do not appoint a proxy, or there is a concern that the proxy is not acting in your best interest, a court may appoint a guardian to act on your behalf. Living will A living will is a set of instructions that state your wishes about medical care when you cannot express them yourself. Health care providers should keep a copy of your living will in your medical record. You may want to give a copy  to family members or friends. To alert caregivers in case of an emergency, you can place a card in your wallet to let them know that you have a living will and where they can find it. A living will is used if you become:  Terminally ill.  Disabled.  Unable to communicate or make decisions. The following decisions should be included in your living will:  To use or not to use life support equipment, such as dialysis machines and breathing machines (ventilators).  Whether you want a DNR or DNAR order. This tells health care providers not to use cardiopulmonary resuscitation (CPR) if breathing or  heartbeat stops.  To use or not to use tube feeding.  To be given or not to be given food and fluids.  Whether you want comfort (palliative) care when the goal becomes comfort rather than a cure.  Whether you want to donate your organs and tissues. A living will does not give instructions for distributing your money and property if you should pass away. DNR or DNAR A DNR or DNAR order is a request not to have CPR in the event that your heart stops beating or you stop breathing. If a DNR or DNAR order has not been made and shared, a health care provider will try to help any patient whose heart has stopped or who has stopped breathing. If you plan to have surgery, talk with your health care provider about how your DNR or DNAR order will be followed if problems occur. What if I do not have an advance directive? Some states assign family decision makers to act on your behalf if you do not have an advance directive. Each state has its own laws about advance directives. You may want to check with your health care provider, attorney, or state representative about the laws in your state. Summary  Advance directives are legal documents that allow you to make decisions about your health care and medical treatment in case you become unable to communicate for yourself.  The process of discussing and writing advance directives should happen over time. You can change and update advance directives at any time.  Advance directives may include a medical power of attorney, a living will, and a DNR or DNAR order. This information is not intended to replace advice given to you by your health care provider. Make sure you discuss any questions you have with your health care provider. Document Revised: 10/15/2019 Document Reviewed: 10/15/2019 Elsevier Patient Education  2021 [employer].
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
## 2020-06-27 ENCOUNTER — Other Ambulatory Visit: Payer: Self-pay | Admitting: Family Medicine

## 2020-06-27 DIAGNOSIS — Z794 Long term (current) use of insulin: Secondary | ICD-10-CM

## 2020-06-27 DIAGNOSIS — E1165 Type 2 diabetes mellitus with hyperglycemia: Secondary | ICD-10-CM

## 2020-07-03 ENCOUNTER — Encounter: Payer: Self-pay | Admitting: *Deleted

## 2020-07-10 ENCOUNTER — Other Ambulatory Visit: Payer: Self-pay

## 2020-07-10 ENCOUNTER — Encounter: Payer: Self-pay | Admitting: Family Medicine

## 2020-07-10 ENCOUNTER — Telehealth: Payer: Self-pay | Admitting: Family Medicine

## 2020-07-10 ENCOUNTER — Telehealth (INDEPENDENT_AMBULATORY_CARE_PROVIDER_SITE_OTHER): Payer: Medicare Other | Admitting: Family Medicine

## 2020-07-10 ENCOUNTER — Ambulatory Visit (HOSPITAL_COMMUNITY)
Admission: RE | Admit: 2020-07-10 | Discharge: 2020-07-10 | Disposition: A | Discharge status: Home / Self Care | Payer: Medicare Other | Source: Ambulatory Visit | Attending: Family Medicine | Admitting: Family Medicine

## 2020-07-10 DIAGNOSIS — G2581 Restless legs syndrome: Secondary | ICD-10-CM

## 2020-07-10 DIAGNOSIS — R531 Weakness: Secondary | ICD-10-CM

## 2020-07-10 DIAGNOSIS — I6381 Other cerebral infarction due to occlusion or stenosis of small artery: Secondary | ICD-10-CM | POA: Diagnosis not present

## 2020-07-10 DIAGNOSIS — F339 Major depressive disorder, recurrent, unspecified: Secondary | ICD-10-CM

## 2020-07-10 DIAGNOSIS — Z8673 Personal history of transient ischemic attack (TIA), and cerebral infarction without residual deficits: Secondary | ICD-10-CM | POA: Diagnosis not present

## 2020-07-10 DIAGNOSIS — I739 Peripheral vascular disease, unspecified: Secondary | ICD-10-CM | POA: Diagnosis not present

## 2020-07-10 MED ORDER — PRAMIPEXOLE DIHYDROCHLORIDE 0.125 MG PO TABS: 0.1250 mg | ORAL_TABLET | Freq: Every day | ORAL | 0 refills | Status: DC

## 2020-07-10 NOTE — Progress Notes (Signed)
Virtual Visit via Telephone Note  I connected with Tracy Farrell on 07/10/20 at 8:47 AM by telephone and verified that I am speaking with the correct person using two identifiers. Tracy Farrell is currently located at home and her husband is currently with her during this visit. The provider, Loman Brooklyn, FNP is located in their office at time of visit.  I discussed the limitations, risks, security and privacy concerns of performing an evaluation and management service by telephone and the availability of in person appointments. I also discussed with the patient that there may be a patient responsible charge related to this service. The patient expressed understanding and agreed to proceed.  Subjective: PCP: Loman Brooklyn, FNP  Chief Complaint  Patient presents with   Depression   restless legs   RLS: patient is following up on an increase in gabapentin to help her restless legs. She reports it did not help at all. She has previously failed Requip.   Depression: feels okay. Does not want a change in medication.   Patient reports left arm pain continuously for the past couple of weeks. States she feels weaker on her left side for the same amount of time. Denies slurred speech or facial drooping. No fall or injury. History of stroke. Does not have a neurologist.    ROS: Per HPI  Current Outpatient Medications:    aspirin EC 81 MG tablet, Take 1 tablet (81 mg total) by mouth daily., Disp: 90 tablet, Rfl: 3   atorvastatin (LIPITOR) 40 MG tablet, Take 1 tablet (40 mg total) by mouth daily at 6 PM., Disp: 90 tablet, Rfl: 1   cholecalciferol (VITAMIN D3) 25 MCG (1000 UNIT) tablet, Take 1,000 Units by mouth daily., Disp: , Rfl:    citalopram (CELEXA) 40 MG tablet, Take 1 tablet (40 mg total) by mouth daily., Disp: 30 tablet, Rfl: 2   dapagliflozin propanediol (FARXIGA) 10 MG TABS tablet, Take 1 tablet (10 mg total) by mouth daily before breakfast., Disp: 90 tablet, Rfl: 1   Ferrous  Sulfate (IRON PO), Take by mouth daily. Patient unsure of dose, Disp: , Rfl:    furosemide (LASIX) 40 MG tablet, Take 1 tablet by mouth once daily, Disp: 90 tablet, Rfl: 0   gabapentin (NEURONTIN) 300 MG capsule, Take 1 capsule (300 mg total) by mouth 2 (two) times daily AND 2 capsules (600 mg total) at bedtime., Disp: 360 capsule, Rfl: 1   glucose blood test strip, Use as instructed, Disp: 100 each, Rfl: 12   Insulin Pen Needle 32G X 4 MM MISC, 1 application by Does not apply route in the morning, at noon, in the evening, and at bedtime., Disp: 1000 each, Rfl: 3   levETIRAcetam (KEPPRA) 500 MG tablet, Take 1 tablet (500 mg total) by mouth 2 (two) times daily., Disp: 180 tablet, Rfl: 1   lisinopril (ZESTRIL) 2.5 MG tablet, Take 1 tablet (2.5 mg total) by mouth daily., Disp: 90 tablet, Rfl: 1   magnesium oxide (MAG-OX) 400 MG tablet, Take 400 mg by mouth daily., Disp: , Rfl:    pantoprazole (PROTONIX) 20 MG tablet, Take 1 tablet (20 mg total) by mouth daily., Disp: 90 tablet, Rfl: 1   TRESIBA FLEXTOUCH 200 UNIT/ML FlexTouch Pen, INJECT 50 UNITS SUBCUTANEOUSLY ONCE DAILY IN THE MORNING, Disp: 9 mL, Rfl: 0   TRULICITY 3 0000000 SOPN, INJECT 1 DOSE ('3MG'$  TOTAL) SUBCUTANEOUSLY ONCE A WEEK, Disp: 4 mL, Rfl: 0  Allergies  Allergen Reactions   Sulfa  Antibiotics Other (See Comments)    UNKNOWN REACTION   Past Medical History:  Diagnosis Date   Arthritis    Carpal tunnel syndrome, bilateral    Chronic back pain    CKD (chronic kidney disease) stage 4, GFR 15-29 ml/min (HCC) 07/07/2019   Depression    Diabetes mellitus    GERD (gastroesophageal reflux disease)    Headache(784.0)    Hyperlipemia    Hypertension    Hypomagnesemia 07/07/2019   RLS (restless legs syndrome)    Stroke (HCC)    Vitamin B12 deficiency 06/04/2020    Observations/Objective: A&O  No respiratory distress or wheezing audible over the phone Mood, judgement, and thought processes all WNL   Assessment and Plan: 1.  Restless leg syndrome Uncontrolled. Patient advised to wean off Gabapentin by decreasing by one tablet each week. Started Mirapex. - pramipexole (MIRAPEX) 0.125 MG tablet; Take 1 tablet (0.125 mg total) by mouth at bedtime. After 1 week may increase to 2 tablets if needed.  Dispense: 30 tablet; Refill: 0  2. Depression, recurrent ([place]) Well controlled on current regimen.   3. Left-sided weakness - CT Head Wo Contrast; Future  4. History of CVA (cerebrovascular accident) - CT Head Wo Contrast; Future   Follow Up Instructions: Return in about 2 weeks (around 07/24/2020) for RLS (telephone).  I discussed the assessment and treatment plan with the patient. The patient was provided an opportunity to ask questions and all were answered. The patient agreed with the plan and demonstrated an understanding of the instructions.   The patient was advised to call back or seek an in-person evaluation if the symptoms worsen or if the condition fails to improve as anticipated.  The above assessment and management plan was discussed with the patient. The patient verbalized understanding of and has agreed to the management plan. Patient is aware to call the clinic if symptoms persist or worsen. Patient is aware when to return to the clinic for a follow-up visit. Patient educated on when it is appropriate to go to the emergency department.   Time call ended: 9:09 AM  I provided 22 minutes of non-face-to-face time during this encounter.  Hendricks Limes, MSN, APRN, FNP-C [facility] 07/10/20

## 2020-07-16 ENCOUNTER — Telehealth: Payer: Self-pay | Admitting: Family Medicine

## 2020-07-16 DIAGNOSIS — N184 Chronic kidney disease, stage 4 (severe): Secondary | ICD-10-CM

## 2020-07-16 NOTE — Telephone Encounter (Signed)
Why is patient getting palliative care?

## 2020-07-16 NOTE — Telephone Encounter (Signed)
Colletta Maryland called from [facility] regarding referral for [place] for pt. Wants to know if Karsten Fells is going to continue being pts attending physician while pt is on pallative care and if so, they need Britney to put order in Epic for it.   Can call Colletta Maryland at [phone] for any questions.

## 2020-07-16 NOTE — Telephone Encounter (Signed)
Spoke to patients sister and she states that hospice has a new program called serious illness care. States that it is not palliative care it is now serious illness care. She thinks that should would benefit from the program since it has NP's that come and talk to patient and social workers.  I asked Tracy Farrell what made her sister qualify and she states since she has CKD, DM & has had a stroke before.   Please review and advise

## 2020-07-16 NOTE — Telephone Encounter (Signed)
Spoke with stephanie at [facility] and asked why patient was going on palliative care States that patients sister requested it   Attempted to contact Shirlean Mylar (sister) at [facility]  When sister calls back please ask for reason she thinks her sister should be on palliative care and also call Stephane back after [name] decides if she will approve or not.

## 2020-07-17 NOTE — Telephone Encounter (Signed)
Attempted to call Colletta Maryland, no answer

## 2020-07-17 NOTE — Telephone Encounter (Signed)
I do not believe these are reasons to refer for her, but can we please call Stephanic back to verify?

## 2020-07-17 NOTE — Telephone Encounter (Signed)
Tracy Farrell returned missed call. Please call her back at work at [phone]. She is leaving work early today so if its after 1:30 then she can be reached on her cell phone at [phone].

## 2020-07-19 ENCOUNTER — Other Ambulatory Visit: Payer: Self-pay | Admitting: Family Medicine

## 2020-07-19 DIAGNOSIS — E1159 Type 2 diabetes mellitus with other circulatory complications: Secondary | ICD-10-CM

## 2020-07-19 DIAGNOSIS — G2581 Restless legs syndrome: Secondary | ICD-10-CM

## 2020-07-19 DIAGNOSIS — Z794 Long term (current) use of insulin: Secondary | ICD-10-CM

## 2020-07-21 NOTE — Telephone Encounter (Signed)
Attempted to contact stephanie - NA

## 2020-07-22 NOTE — Telephone Encounter (Signed)
Tracy Farrell did send a community message that these diagnoses do qualify patient for serious illness care. I have let her know I can be the treating/signing provider.

## 2020-07-23 ENCOUNTER — Encounter: Payer: Self-pay | Admitting: Family Medicine

## 2020-07-23 ENCOUNTER — Ambulatory Visit (INDEPENDENT_AMBULATORY_CARE_PROVIDER_SITE_OTHER): Payer: Medicare Other | Admitting: Family Medicine

## 2020-07-23 DIAGNOSIS — N184 Chronic kidney disease, stage 4 (severe): Secondary | ICD-10-CM

## 2020-07-23 DIAGNOSIS — G2581 Restless legs syndrome: Secondary | ICD-10-CM | POA: Diagnosis not present

## 2020-07-23 DIAGNOSIS — E538 Deficiency of other specified B group vitamins: Secondary | ICD-10-CM

## 2020-07-23 DIAGNOSIS — M1990 Unspecified osteoarthritis, unspecified site: Secondary | ICD-10-CM

## 2020-07-23 MED ORDER — PRAMIPEXOLE DIHYDROCHLORIDE 0.125 MG PO TABS: 0.1250 mg | ORAL_TABLET | Freq: Every day | ORAL | 1 refills | Status: DC

## 2020-07-23 NOTE — Telephone Encounter (Signed)
Referral placed.

## 2020-07-23 NOTE — Addendum Note (Signed)
Addended by: Hendricks Limes F on: 07/23/2020 12:17 PM   Modules accepted: Orders

## 2020-07-23 NOTE — Progress Notes (Signed)
Virtual Visit via Telephone Note  I connected with Tracy Farrell on 07/23/20 at 11:05 AM by telephone and verified that I am speaking with the correct person using two identifiers. Tracy Farrell is currently located at home and nobody is currently with her during this visit. The provider, Loman Brooklyn, FNP is located in their office at time of visit.  I discussed the limitations, risks, security and privacy concerns of performing an evaluation and management service by telephone and the availability of in person appointments. I also discussed with the patient that there may be a patient responsible charge related to this service. The patient expressed understanding and agreed to proceed.  Subjective: PCP: Loman Brooklyn, FNP  Chief Complaint  Patient presents with   RLS   Patient is following up on restless leg syndrome. She has weaned off gabapentin. Previously failed therapy with Requip. She was started on Mirapex with directions that she could increase to two tablets if needed after one week. She reports she has been doing a lot better and sleeping well with just one tablet.  Patient reports she continues to feel fatigued even though she is sleeping better. She is not sure if she is taking the vitamin B12 supplement as she does not fill her pill boxes, a family member does this for her. She has also not seen the nephrologist regarding her declining kidney function.   She reports she feels very stiff upon standing. It does get better. She does not take any Tylenol as previously directed.    ROS: Per HPI  Current Outpatient Medications:    aspirin EC 81 MG tablet, Take 1 tablet (81 mg total) by mouth daily., Disp: 90 tablet, Rfl: 3   atorvastatin (LIPITOR) 40 MG tablet, Take 1 tablet (40 mg total) by mouth daily at 6 PM., Disp: 90 tablet, Rfl: 1   cholecalciferol (VITAMIN D3) 25 MCG (1000 UNIT) tablet, Take 1,000 Units by mouth daily., Disp: , Rfl:    citalopram (CELEXA) 40 MG  tablet, Take 1 tablet (40 mg total) by mouth daily., Disp: 30 tablet, Rfl: 2   dapagliflozin propanediol (FARXIGA) 10 MG TABS tablet, Take 1 tablet (10 mg total) by mouth daily before breakfast., Disp: 90 tablet, Rfl: 1   Ferrous Sulfate (IRON PO), Take by mouth daily. Patient unsure of dose, Disp: , Rfl:    furosemide (LASIX) 40 MG tablet, Take 1 tablet by mouth once daily, Disp: 90 tablet, Rfl: 0   gabapentin (NEURONTIN) 300 MG capsule, Take 1 capsule (300 mg total) by mouth 2 (two) times daily AND 2 capsules (600 mg total) at bedtime., Disp: 360 capsule, Rfl: 1   glucose blood test strip, Use as instructed, Disp: 100 each, Rfl: 12   Insulin Pen Needle 32G X 4 MM MISC, 1 application by Does not apply route in the morning, at noon, in the evening, and at bedtime., Disp: 1000 each, Rfl: 3   levETIRAcetam (KEPPRA) 500 MG tablet, Take 1 tablet (500 mg total) by mouth 2 (two) times daily., Disp: 180 tablet, Rfl: 1   lisinopril (ZESTRIL) 2.5 MG tablet, Take 1 tablet by mouth once daily, Disp: 90 tablet, Rfl: 0   magnesium oxide (MAG-OX) 400 MG tablet, Take 400 mg by mouth daily., Disp: , Rfl:    pantoprazole (PROTONIX) 20 MG tablet, Take 1 tablet (20 mg total) by mouth daily., Disp: 90 tablet, Rfl: 1   pramipexole (MIRAPEX) 0.125 MG tablet, Take 1 tablet (0.125 mg total) by  mouth at bedtime. After 1 week may increase to 2 tablets if needed., Disp: 30 tablet, Rfl: 0   TRESIBA FLEXTOUCH 200 UNIT/ML FlexTouch Pen, INJECT 50 UNITS SUBCUTANEOUSLY ONCE DAILY IN THE MORNING, Disp: 9 mL, Rfl: 0   TRULICITY 3 0000000 SOPN, INJECT 1 DOSE ('3MG'$  TOTAL) SUBCUTANEOUSLY ONCE A WEEK, Disp: 4 mL, Rfl: 0  Allergies  Allergen Reactions   Sulfa Antibiotics Other (See Comments)    UNKNOWN REACTION   Past Medical History:  Diagnosis Date   Arthritis    Carpal tunnel syndrome, bilateral    Chronic back pain    CKD (chronic kidney disease) stage 4, GFR 15-29 ml/min (HCC) 07/07/2019   Depression    Diabetes mellitus     GERD (gastroesophageal reflux disease)    Headache(784.0)    Hyperlipemia    Hypertension    Hypomagnesemia 07/07/2019   RLS (restless legs syndrome)    Stroke (HCC)    Vitamin B12 deficiency 06/04/2020    Observations/Objective: A&O  No respiratory distress or wheezing audible over the phone Mood, judgement, and thought processes all WNL   Assessment and Plan: 1. Restless leg syndrome Well controlled on current regimen.  - pramipexole (MIRAPEX) 0.125 MG tablet; Take 1 tablet (0.125 mg total) by mouth at bedtime.  Dispense: 90 tablet; Refill: 1  2. Vitamin B12 deficiency Encouraged patient to make a note to discuss vitamin B12 supplement with family member that does her pill box so she can make sure she is taking it.   3. CKD (chronic kidney disease) stage 4, GFR 15-29 ml/min (HCC) Encouraged to call nephrology and re-schedule appointment.  4. Arthritis Encouraged to take Tylenol as needed.    Follow Up Instructions: Return in about 6 weeks (around 09/03/2020) for follow-up of chronic medication conditions.  I discussed the assessment and treatment plan with the patient. The patient was provided an opportunity to ask questions and all were answered. The patient agreed with the plan and demonstrated an understanding of the instructions.   The patient was advised to call back or seek an in-person evaluation if the symptoms worsen or if the condition fails to improve as anticipated.  The above assessment and management plan was discussed with the patient. The patient verbalized understanding of and has agreed to the management plan. Patient is aware to call the clinic if symptoms persist or worsen. Patient is aware when to return to the clinic for a follow-up visit. Patient educated on when it is appropriate to go to the emergency department.   Time call ended: 11:16 PM  I provided 11 minutes of non-face-to-face time during this encounter.  Hendricks Limes, MSN, APRN,  FNP-C [facility] 07/23/20

## 2020-07-29 ENCOUNTER — Other Ambulatory Visit: Payer: Self-pay | Admitting: Family Medicine

## 2020-07-29 DIAGNOSIS — Z794 Long term (current) use of insulin: Secondary | ICD-10-CM

## 2020-08-11 DIAGNOSIS — Z515 Encounter for palliative care: Secondary | ICD-10-CM | POA: Diagnosis not present

## 2020-08-11 DIAGNOSIS — N184 Chronic kidney disease, stage 4 (severe): Secondary | ICD-10-CM | POA: Diagnosis not present

## 2020-08-21 ENCOUNTER — Other Ambulatory Visit: Payer: Self-pay | Admitting: Family Medicine

## 2020-08-21 DIAGNOSIS — Z794 Long term (current) use of insulin: Secondary | ICD-10-CM

## 2020-08-21 DIAGNOSIS — E1165 Type 2 diabetes mellitus with hyperglycemia: Secondary | ICD-10-CM

## 2020-09-03 ENCOUNTER — Encounter: Payer: Self-pay | Admitting: Family Medicine

## 2020-09-03 ENCOUNTER — Other Ambulatory Visit: Payer: Self-pay

## 2020-09-03 ENCOUNTER — Ambulatory Visit (INDEPENDENT_AMBULATORY_CARE_PROVIDER_SITE_OTHER): Payer: Medicare Other | Admitting: Family Medicine

## 2020-09-03 VITALS — BP 130/77 | HR 78 | Temp 96.8°F | Ht 62.0 in | Wt 211.8 lb

## 2020-09-03 DIAGNOSIS — E1159 Type 2 diabetes mellitus with other circulatory complications: Secondary | ICD-10-CM

## 2020-09-03 DIAGNOSIS — G2581 Restless legs syndrome: Secondary | ICD-10-CM | POA: Diagnosis not present

## 2020-09-03 DIAGNOSIS — F339 Major depressive disorder, recurrent, unspecified: Secondary | ICD-10-CM

## 2020-09-03 DIAGNOSIS — E785 Hyperlipidemia, unspecified: Secondary | ICD-10-CM

## 2020-09-03 DIAGNOSIS — E1169 Type 2 diabetes mellitus with other specified complication: Secondary | ICD-10-CM | POA: Diagnosis not present

## 2020-09-03 DIAGNOSIS — N184 Chronic kidney disease, stage 4 (severe): Secondary | ICD-10-CM | POA: Diagnosis not present

## 2020-09-03 DIAGNOSIS — Z9189 Other specified personal risk factors, not elsewhere classified: Secondary | ICD-10-CM

## 2020-09-03 DIAGNOSIS — Z23 Encounter for immunization: Secondary | ICD-10-CM | POA: Diagnosis not present

## 2020-09-03 DIAGNOSIS — E538 Deficiency of other specified B group vitamins: Secondary | ICD-10-CM | POA: Diagnosis not present

## 2020-09-03 DIAGNOSIS — I152 Hypertension secondary to endocrine disorders: Secondary | ICD-10-CM | POA: Diagnosis not present

## 2020-09-03 DIAGNOSIS — D509 Iron deficiency anemia, unspecified: Secondary | ICD-10-CM

## 2020-09-03 DIAGNOSIS — Z794 Long term (current) use of insulin: Secondary | ICD-10-CM

## 2020-09-03 DIAGNOSIS — Z5982 Transportation insecurity: Secondary | ICD-10-CM

## 2020-09-03 DIAGNOSIS — E1165 Type 2 diabetes mellitus with hyperglycemia: Secondary | ICD-10-CM | POA: Diagnosis not present

## 2020-09-03 LAB — CMP14+EGFR
ALT: 11 IU/L (ref 0–32)
AST: 10 IU/L (ref 0–40)
Albumin/Globulin Ratio: 1.3 (ref 1.2–2.2)
Albumin: 3.8 g/dL (ref 3.8–4.8)
Alkaline Phosphatase: 140 IU/L — ABNORMAL HIGH (ref 44–121)
BUN/Creatinine Ratio: 9 — ABNORMAL LOW (ref 12–28)
BUN: 15 mg/dL (ref 8–27)
Bilirubin Total: 0.5 mg/dL (ref 0.0–1.2)
CO2: 24 mmol/L (ref 20–29)
Calcium: 9.4 mg/dL (ref 8.7–10.3)
Chloride: 98 mmol/L (ref 96–106)
Creatinine, Ser: 1.73 mg/dL — ABNORMAL HIGH (ref 0.57–1.00)
Globulin, Total: 2.9 g/dL (ref 1.5–4.5)
Glucose: 73 mg/dL (ref 65–99)
Potassium: 4.6 mmol/L (ref 3.5–5.2)
Sodium: 140 mmol/L (ref 134–144)
Total Protein: 6.7 g/dL (ref 6.0–8.5)
eGFR: 32 mL/min/{1.73_m2} — ABNORMAL LOW (ref >59)

## 2020-09-03 LAB — CBC WITH DIFFERENTIAL/PLATELET
Basophils Absolute: 0 10*3/uL (ref 0.0–0.2)
Basos: 0 %
EOS (ABSOLUTE): 0.2 10*3/uL (ref 0.0–0.4)
Eos: 2 %
Hematocrit: 37.8 % (ref 34.0–46.6)
Hemoglobin: 12.4 g/dL (ref 11.1–15.9)
Immature Grans (Abs): 0 10*3/uL (ref 0.0–0.1)
Immature Granulocytes: 0 %
Lymphocytes Absolute: 2.1 10*3/uL (ref 0.7–3.1)
Lymphs: 21 %
MCH: 29 pg (ref 26.6–33.0)
MCHC: 32.8 g/dL (ref 31.5–35.7)
MCV: 89 fL (ref 79–97)
Monocytes Absolute: 0.6 10*3/uL (ref 0.1–0.9)
Monocytes: 6 %
Neutrophils Absolute: 6.9 10*3/uL (ref 1.4–7.0)
Neutrophils: 71 %
Platelets: 336 10*3/uL (ref 150–450)
RBC: 4.27 x10E6/uL (ref 3.77–5.28)
RDW: 13.8 % (ref 11.7–15.4)
WBC: 9.8 10*3/uL (ref 3.4–10.8)

## 2020-09-03 LAB — LIPID PANEL
Chol/HDL Ratio: 2.8 ratio (ref 0.0–4.4)
Cholesterol, Total: 102 mg/dL (ref 100–199)
HDL: 36 mg/dL — ABNORMAL LOW (ref >39)
LDL Chol Calc (NIH): 46 mg/dL (ref 0–99)
Triglycerides: 104 mg/dL (ref 0–149)
VLDL Cholesterol Cal: 20 mg/dL (ref 5–40)

## 2020-09-03 LAB — BAYER DCA HB A1C WAIVED: HB A1C (BAYER DCA - WAIVED): 6 % (ref <7.0)

## 2020-09-03 MED ORDER — CITALOPRAM HYDROBROMIDE 40 MG PO TABS: 40.0000 mg | ORAL_TABLET | Freq: Every day | ORAL | 1 refills | Status: DC

## 2020-09-03 MED ORDER — LISINOPRIL 2.5 MG PO TABS: 2.5000 mg | ORAL_TABLET | Freq: Every day | ORAL | 1 refills | Status: DC
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
## 2020-09-03 NOTE — Patient Instructions (Addendum)
Reminder: please return after mid-September for your flu shot. If you receive this elsewhere, such as your pharmacy, please let us know so we can get this documented in your chart.   Please go get vitamin B-12 1,000 mcg over the counter and start taking once daily.  Reschedule appointments with nephrology (Dr. Theador Hawthorne).

## 2020-09-10 DIAGNOSIS — N184 Chronic kidney disease, stage 4 (severe): Secondary | ICD-10-CM | POA: Diagnosis not present

## 2020-09-10 DIAGNOSIS — Z515 Encounter for palliative care: Secondary | ICD-10-CM | POA: Diagnosis not present

## 2020-09-12 ENCOUNTER — Other Ambulatory Visit: Payer: Self-pay | Admitting: Family Medicine

## 2020-09-12 DIAGNOSIS — E1165 Type 2 diabetes mellitus with hyperglycemia: Secondary | ICD-10-CM

## 2020-10-17 ENCOUNTER — Other Ambulatory Visit: Payer: Self-pay | Admitting: Family Medicine

## 2020-10-17 DIAGNOSIS — E1165 Type 2 diabetes mellitus with hyperglycemia: Secondary | ICD-10-CM

## 2020-10-17 DIAGNOSIS — Z794 Long term (current) use of insulin: Secondary | ICD-10-CM

## 2020-10-19 ENCOUNTER — Other Ambulatory Visit: Payer: Self-pay | Admitting: Family Medicine

## 2020-10-19 DIAGNOSIS — I152 Hypertension secondary to endocrine disorders: Secondary | ICD-10-CM

## 2020-10-19 DIAGNOSIS — Z794 Long term (current) use of insulin: Secondary | ICD-10-CM

## 2020-10-20 ENCOUNTER — Other Ambulatory Visit: Payer: Self-pay

## 2020-10-20 ENCOUNTER — Encounter: Payer: Self-pay | Admitting: Family Medicine

## 2020-10-20 ENCOUNTER — Ambulatory Visit (INDEPENDENT_AMBULATORY_CARE_PROVIDER_SITE_OTHER): Payer: Medicare Other | Admitting: Family Medicine

## 2020-10-20 VITALS — BP 133/74 | HR 70 | Temp 97.7°F | Ht 62.0 in | Wt 207.0 lb

## 2020-10-20 DIAGNOSIS — R251 Tremor, unspecified: Secondary | ICD-10-CM | POA: Diagnosis not present

## 2020-10-20 DIAGNOSIS — Z794 Long term (current) use of insulin: Secondary | ICD-10-CM | POA: Diagnosis not present

## 2020-10-20 DIAGNOSIS — E1165 Type 2 diabetes mellitus with hyperglycemia: Secondary | ICD-10-CM

## 2020-10-20 DIAGNOSIS — Z23 Encounter for immunization: Secondary | ICD-10-CM | POA: Diagnosis not present

## 2020-10-20 MED ORDER — PROPRANOLOL HCL 10 MG PO TABS: 10.0000 mg | ORAL_TABLET | Freq: Two times a day (BID) | ORAL | 3 refills | Status: DC
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
## 2020-10-21 ENCOUNTER — Other Ambulatory Visit: Payer: Self-pay | Admitting: Family Medicine

## 2020-10-21 DIAGNOSIS — E1165 Type 2 diabetes mellitus with hyperglycemia: Secondary | ICD-10-CM

## 2020-10-21 DIAGNOSIS — Z794 Long term (current) use of insulin: Secondary | ICD-10-CM

## 2020-10-21 LAB — CMP14+EGFR
ALT: 9 IU/L (ref 0–32)
AST: 10 IU/L (ref 0–40)
Albumin/Globulin Ratio: 1.7 (ref 1.2–2.2)
Albumin: 4.2 g/dL (ref 3.8–4.8)
Alkaline Phosphatase: 125 IU/L — ABNORMAL HIGH (ref 44–121)
BUN/Creatinine Ratio: 9 — ABNORMAL LOW (ref 12–28)
BUN: 15 mg/dL (ref 8–27)
Bilirubin Total: 0.5 mg/dL (ref 0.0–1.2)
CO2: 22 mmol/L (ref 20–29)
Calcium: 9.4 mg/dL (ref 8.7–10.3)
Chloride: 99 mmol/L (ref 96–106)
Creatinine, Ser: 1.63 mg/dL — ABNORMAL HIGH (ref 0.57–1.00)
Globulin, Total: 2.5 g/dL (ref 1.5–4.5)
Glucose: 98 mg/dL (ref 70–99)
Potassium: 4 mmol/L (ref 3.5–5.2)
Sodium: 139 mmol/L (ref 134–144)
Total Protein: 6.7 g/dL (ref 6.0–8.5)
eGFR: 35 mL/min/{1.73_m2} — ABNORMAL LOW (ref >59)

## 2020-10-21 LAB — THYROID PANEL WITH TSH
Free Thyroxine Index: 2.4 (ref 1.2–4.9)
T3 Uptake Ratio: 27 % (ref 24–39)
T4, Total: 8.8 ug/dL (ref 4.5–12.0)
TSH: 3.07 u[IU]/mL (ref 0.450–4.500)

## 2020-10-24 ENCOUNTER — Other Ambulatory Visit: Payer: Self-pay | Admitting: Family Medicine

## 2020-10-24 DIAGNOSIS — Z87898 Personal history of other specified conditions: Secondary | ICD-10-CM

## 2020-10-28 ENCOUNTER — Ambulatory Visit: Payer: Medicare Other | Admitting: Gastroenterology

## 2020-10-28 ENCOUNTER — Encounter: Payer: Self-pay | Admitting: Gastroenterology

## 2020-11-14 ENCOUNTER — Other Ambulatory Visit: Payer: Self-pay | Admitting: Family Medicine

## 2020-11-14 DIAGNOSIS — Z794 Long term (current) use of insulin: Secondary | ICD-10-CM

## 2020-11-14 DIAGNOSIS — E1165 Type 2 diabetes mellitus with hyperglycemia: Secondary | ICD-10-CM

## 2020-12-04 ENCOUNTER — Encounter: Payer: Medicare Other | Admitting: Family Medicine

## 2020-12-04 ENCOUNTER — Ambulatory Visit (INDEPENDENT_AMBULATORY_CARE_PROVIDER_SITE_OTHER): Payer: Medicare Other | Admitting: Family Medicine

## 2020-12-04 ENCOUNTER — Encounter: Payer: Self-pay | Admitting: Family Medicine

## 2020-12-04 DIAGNOSIS — J069 Acute upper respiratory infection, unspecified: Secondary | ICD-10-CM

## 2020-12-04 NOTE — Progress Notes (Signed)
Virtual Visit via Telephone Note  I connected with Tracy Farrell on 12/04/20 at 8:41 AM by telephone and verified that I am speaking with the correct person using two identifiers. Tracy Farrell is currently located at home and nobody is currently with her during this visit. The provider, Loman Brooklyn, FNP is located in their office at time of visit.  I discussed the limitations, risks, security and privacy concerns of performing an evaluation and management service by telephone and the availability of in person appointments. I also discussed with the patient that there may be a patient responsible charge related to this service. The patient expressed understanding and agreed to proceed.  Subjective: PCP: Loman Brooklyn, FNP  Chief Complaint  Patient presents with   Cough   Patient complains of cough, headache, sore throat, ear pain/pressure, and body aches . Onset of symptoms was 1 day ago, gradually worsening since that time. She is drinking plenty of fluids. Evaluation to date: at home COVID test negative. Treatment to date: none. She does not smoke.    ROS: Per HPI  Current Outpatient Medications:    atorvastatin (LIPITOR) 40 MG tablet, Take 1 tablet (40 mg total) by mouth daily at 6 PM., Disp: 90 tablet, Rfl: 1   cholecalciferol (VITAMIN D3) 25 MCG (1000 UNIT) tablet, Take 1,000 Units by mouth daily., Disp: , Rfl:    citalopram (CELEXA) 40 MG tablet, Take 1 tablet (40 mg total) by mouth daily., Disp: 90 tablet, Rfl: 1   dapagliflozin propanediol (FARXIGA) 10 MG TABS tablet, Take 1 tablet (10 mg total) by mouth daily before breakfast., Disp: 90 tablet, Rfl: 1   EQ ASPIRIN ADULT LOW DOSE 81 MG EC tablet, Take 1 tablet by mouth once daily, Disp: 90 tablet, Rfl: 0   Ferrous Sulfate (IRON PO), Take by mouth daily. Patient unsure of dose, Disp: , Rfl:    furosemide (LASIX) 40 MG tablet, Take 1 tablet by mouth once daily, Disp: 90 tablet, Rfl: 0   glucose blood test strip, Use as  instructed, Disp: 100 each, Rfl: 12   Insulin Pen Needle 32G X 4 MM MISC, 1 application by Does not apply route in the morning, at noon, in the evening, and at bedtime., Disp: 1000 each, Rfl: 3   levETIRAcetam (KEPPRA) 500 MG tablet, Take 1 tablet by mouth twice daily, Disp: 180 tablet, Rfl: 0   lisinopril (ZESTRIL) 2.5 MG tablet, Take 1 tablet (2.5 mg total) by mouth daily., Disp: 90 tablet, Rfl: 1   magnesium oxide (MAG-OX) 400 MG tablet, Take 400 mg by mouth daily., Disp: , Rfl:    pantoprazole (PROTONIX) 20 MG tablet, Take 1 tablet (20 mg total) by mouth daily., Disp: 90 tablet, Rfl: 1   pramipexole (MIRAPEX) 0.125 MG tablet, Take 1 tablet (0.125 mg total) by mouth at bedtime., Disp: 90 tablet, Rfl: 1   propranolol (INDERAL) 10 MG tablet, Take 1 tablet (10 mg total) by mouth 2 (two) times daily. tremor, Disp: 60 tablet, Rfl: 3   TRESIBA FLEXTOUCH 200 UNIT/ML FlexTouch Pen, INJECT 50 UNITS SUBCUTANEOUSLY ONCE DAILY IN THE MORNING, Disp: 9 mL, Rfl: 0   TRULICITY 3 EP/3.2RJ SOPN, INJECT 1 DOSE (3MG  TOTAL) SUBCUTANEOUSLY ONCE A WEEK, Disp: 4 mL, Rfl: 0   vitamin B-12 (CYANOCOBALAMIN) 1000 MCG tablet, Take 1,000 mcg by mouth daily., Disp: , Rfl:   Allergies  Allergen Reactions   Sulfa Antibiotics Other (See Comments)    UNKNOWN REACTION   Past Medical History:  Diagnosis Date   Arthritis    Carpal tunnel syndrome, bilateral    Chronic back pain    CKD (chronic kidney disease) stage 4, GFR 15-29 ml/min (HCC) 07/07/2019   Depression    Diabetes mellitus    GERD (gastroesophageal reflux disease)    Headache(784.0)    Hyperlipemia    Hypertension    Hypomagnesemia 07/07/2019   RLS (restless legs syndrome)    Stroke (HCC)    Vitamin B12 deficiency 06/04/2020    Observations/Objective: A&O  No respiratory distress or wheezing audible over the phone Mood, judgement, and thought processes all WNL  Assessment and Plan: 1. Viral URI Symptom management. Patient declines Tamiflu. We are  unable to test currently for influenza.    Follow Up Instructions:  I discussed the assessment and treatment plan with the patient. The patient was provided an opportunity to ask questions and all were answered. The patient agreed with the plan and demonstrated an understanding of the instructions.   The patient was advised to call back or seek an in-person evaluation if the symptoms worsen or if the condition fails to improve as anticipated.  The above assessment and management plan was discussed with the patient. The patient verbalized understanding of and has agreed to the management plan. Patient is aware to call the clinic if symptoms persist or worsen. Patient is aware when to return to the clinic for a follow-up visit. Patient educated on when it is appropriate to go to the emergency department.   Time call ended: 8:52 AM  I provided 11 minutes of non-face-to-face time during this encounter.  Hendricks Limes, MSN, APRN, FNP-C [facility] 12/04/20

## 2020-12-12 ENCOUNTER — Other Ambulatory Visit: Payer: Self-pay | Admitting: Family Medicine

## 2020-12-12 DIAGNOSIS — E1165 Type 2 diabetes mellitus with hyperglycemia: Secondary | ICD-10-CM

## 2020-12-16 ENCOUNTER — Other Ambulatory Visit: Payer: Self-pay | Admitting: Family Medicine

## 2020-12-16 DIAGNOSIS — K219 Gastro-esophageal reflux disease without esophagitis: Secondary | ICD-10-CM

## 2021-01-22 ENCOUNTER — Other Ambulatory Visit: Payer: Self-pay | Admitting: Family Medicine

## 2021-01-22 DIAGNOSIS — E1165 Type 2 diabetes mellitus with hyperglycemia: Secondary | ICD-10-CM

## 2021-01-22 NOTE — Telephone Encounter (Signed)
°  Prescription Request  01/22/2021  Is this a "Controlled Substance" medicine? no  Have you seen your PCP in the last 2 weeks? no  If YES, route message to pool  -  If NO, patient needs to be scheduled for appointment.  What is the name of the medication or equipment? Trulicity  Have you contacted your pharmacy to request a refill? yes   Which pharmacy would you like this sent to? [facility]   Patient notified that their request is being sent to the clinical staff for review and that they should receive a response within 2 business days.    Joyce's pt.

## 2021-01-26 ENCOUNTER — Telehealth: Payer: Self-pay | Admitting: Family Medicine

## 2021-01-26 NOTE — Telephone Encounter (Signed)
If we have an Ozempic sample she could use it while continuing to try to find Trulicity. Please check and let the patient know. She can do the 1 mg weekly dose.

## 2021-01-26 NOTE — Telephone Encounter (Signed)
Pt has an appt on Feb 09, 2021 w/Tracy Farrell. Can she get enough meds until her appt?

## 2021-01-27 DIAGNOSIS — N184 Chronic kidney disease, stage 4 (severe): Secondary | ICD-10-CM | POA: Diagnosis not present

## 2021-01-27 DIAGNOSIS — Z515 Encounter for palliative care: Secondary | ICD-10-CM | POA: Diagnosis not present

## 2021-01-27 NOTE — Telephone Encounter (Signed)
We do not have 1mg  Ozempic - what would you like patient to do ?

## 2021-01-27 NOTE — Telephone Encounter (Signed)
I put her name on an Ozempic pen in the refrigerator. She can use 0.5 mg once weekly while continuing to try to get Trulicity.

## 2021-01-27 NOTE — Telephone Encounter (Signed)
Patient aware and verbalizes understanding. 

## 2021-01-29 ENCOUNTER — Other Ambulatory Visit: Payer: Self-pay | Admitting: Family Medicine

## 2021-01-29 DIAGNOSIS — E1165 Type 2 diabetes mellitus with hyperglycemia: Secondary | ICD-10-CM

## 2021-02-09 ENCOUNTER — Encounter: Payer: Self-pay | Admitting: Family Medicine

## 2021-02-09 ENCOUNTER — Encounter: Payer: Medicare Other | Admitting: Family Medicine

## 2021-02-14 ENCOUNTER — Other Ambulatory Visit: Payer: Self-pay | Admitting: Family Medicine

## 2021-02-14 DIAGNOSIS — E785 Hyperlipidemia, unspecified: Secondary | ICD-10-CM

## 2021-02-14 DIAGNOSIS — Z87898 Personal history of other specified conditions: Secondary | ICD-10-CM

## 2021-02-14 DIAGNOSIS — R251 Tremor, unspecified: Secondary | ICD-10-CM

## 2021-02-14 DIAGNOSIS — Z794 Long term (current) use of insulin: Secondary | ICD-10-CM

## 2021-02-14 DIAGNOSIS — G2581 Restless legs syndrome: Secondary | ICD-10-CM

## 2021-02-14 DIAGNOSIS — E1165 Type 2 diabetes mellitus with hyperglycemia: Secondary | ICD-10-CM

## 2021-02-14 DIAGNOSIS — E119 Type 2 diabetes mellitus without complications: Secondary | ICD-10-CM

## 2021-02-14 DIAGNOSIS — E1159 Type 2 diabetes mellitus with other circulatory complications: Secondary | ICD-10-CM

## 2021-02-14 DIAGNOSIS — N184 Chronic kidney disease, stage 4 (severe): Secondary | ICD-10-CM

## 2021-02-14 DIAGNOSIS — E1169 Type 2 diabetes mellitus with other specified complication: Secondary | ICD-10-CM

## 2021-02-14 DIAGNOSIS — Z8673 Personal history of transient ischemic attack (TIA), and cerebral infarction without residual deficits: Secondary | ICD-10-CM

## 2021-02-21 ENCOUNTER — Other Ambulatory Visit: Payer: Self-pay | Admitting: Family Medicine

## 2021-02-21 DIAGNOSIS — G2581 Restless legs syndrome: Secondary | ICD-10-CM

## 2021-03-03 ENCOUNTER — Encounter: Payer: Self-pay | Admitting: Family Medicine

## 2021-03-03 ENCOUNTER — Ambulatory Visit (INDEPENDENT_AMBULATORY_CARE_PROVIDER_SITE_OTHER): Payer: Medicare Other | Admitting: Family Medicine

## 2021-03-03 VITALS — BP 125/67 | HR 64 | Temp 97.2°F | Ht 62.0 in | Wt 214.6 lb

## 2021-03-03 DIAGNOSIS — Z87898 Personal history of other specified conditions: Secondary | ICD-10-CM | POA: Diagnosis not present

## 2021-03-03 DIAGNOSIS — I152 Hypertension secondary to endocrine disorders: Secondary | ICD-10-CM | POA: Diagnosis not present

## 2021-03-03 DIAGNOSIS — Z1211 Encounter for screening for malignant neoplasm of colon: Secondary | ICD-10-CM

## 2021-03-03 DIAGNOSIS — E785 Hyperlipidemia, unspecified: Secondary | ICD-10-CM

## 2021-03-03 DIAGNOSIS — E1165 Type 2 diabetes mellitus with hyperglycemia: Secondary | ICD-10-CM

## 2021-03-03 DIAGNOSIS — N184 Chronic kidney disease, stage 4 (severe): Secondary | ICD-10-CM | POA: Diagnosis not present

## 2021-03-03 DIAGNOSIS — G2581 Restless legs syndrome: Secondary | ICD-10-CM | POA: Diagnosis not present

## 2021-03-03 DIAGNOSIS — E1169 Type 2 diabetes mellitus with other specified complication: Secondary | ICD-10-CM | POA: Diagnosis not present

## 2021-03-03 DIAGNOSIS — K219 Gastro-esophageal reflux disease without esophagitis: Secondary | ICD-10-CM | POA: Diagnosis not present

## 2021-03-03 DIAGNOSIS — Z8673 Personal history of transient ischemic attack (TIA), and cerebral infarction without residual deficits: Secondary | ICD-10-CM

## 2021-03-03 DIAGNOSIS — Z794 Long term (current) use of insulin: Secondary | ICD-10-CM | POA: Diagnosis not present

## 2021-03-03 DIAGNOSIS — Z0000 Encounter for general adult medical examination without abnormal findings: Secondary | ICD-10-CM

## 2021-03-03 DIAGNOSIS — E1159 Type 2 diabetes mellitus with other circulatory complications: Secondary | ICD-10-CM

## 2021-03-03 DIAGNOSIS — Z0001 Encounter for general adult medical examination with abnormal findings: Secondary | ICD-10-CM | POA: Diagnosis not present

## 2021-03-03 LAB — BAYER DCA HB A1C WAIVED: HB A1C (BAYER DCA - WAIVED): 6.8 % — ABNORMAL HIGH (ref 4.8–5.6)

## 2021-03-03 LAB — LIPID PANEL

## 2021-03-03 MED ORDER — ATORVASTATIN CALCIUM 40 MG PO TABS: 40.0000 mg | ORAL_TABLET | Freq: Every day | ORAL | 1 refills | Status: DC

## 2021-03-03 MED ORDER — PRAMIPEXOLE DIHYDROCHLORIDE 0.125 MG PO TABS: 0.1250 mg | ORAL_TABLET | Freq: Every day | ORAL | 1 refills | Status: DC

## 2021-03-03 MED ORDER — PANTOPRAZOLE SODIUM 20 MG PO TBEC: 20.0000 mg | DELAYED_RELEASE_TABLET | Freq: Every day | ORAL | 1 refills | Status: DC

## 2021-03-03 MED ORDER — LEVETIRACETAM 500 MG PO TABS: 500.0000 mg | ORAL_TABLET | Freq: Two times a day (BID) | ORAL | 1 refills | Status: DC

## 2021-03-03 MED ORDER — FUROSEMIDE 40 MG PO TABS: 40.0000 mg | ORAL_TABLET | Freq: Every day | ORAL | 1 refills | Status: DC

## 2021-03-03 MED ORDER — DAPAGLIFLOZIN PROPANEDIOL 10 MG PO TABS: 10.0000 mg | ORAL_TABLET | Freq: Every day | ORAL | 1 refills | Status: DC
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
## 2021-03-03 NOTE — Patient Instructions (Addendum)
Schedule dentist appointment.  [place] [phone] Please call and schedule a follow-up appointment.

## 2021-03-04 LAB — CMP14+EGFR
ALT: 15 IU/L (ref 0–32)
AST: 13 IU/L (ref 0–40)
Albumin/Globulin Ratio: 1.7 (ref 1.2–2.2)
Albumin: 4.1 g/dL (ref 3.8–4.8)
Alkaline Phosphatase: 123 IU/L — ABNORMAL HIGH (ref 44–121)
BUN/Creatinine Ratio: 11 — ABNORMAL LOW (ref 12–28)
BUN: 21 mg/dL (ref 8–27)
Bilirubin Total: 0.3 mg/dL (ref 0.0–1.2)
CO2: 24 mmol/L (ref 20–29)
Calcium: 9.3 mg/dL (ref 8.7–10.3)
Chloride: 99 mmol/L (ref 96–106)
Creatinine, Ser: 1.98 mg/dL — ABNORMAL HIGH (ref 0.57–1.00)
Globulin, Total: 2.4 g/dL (ref 1.5–4.5)
Glucose: 116 mg/dL — ABNORMAL HIGH (ref 70–99)
Potassium: 4.5 mmol/L (ref 3.5–5.2)
Sodium: 138 mmol/L (ref 134–144)
Total Protein: 6.5 g/dL (ref 6.0–8.5)
eGFR: 27 mL/min/{1.73_m2} — ABNORMAL LOW (ref >59)

## 2021-03-04 LAB — LIPID PANEL
Chol/HDL Ratio: 3.2 ratio (ref 0.0–4.4)
Cholesterol, Total: 129 mg/dL (ref 100–199)
HDL: 40 mg/dL (ref >39)
LDL Chol Calc (NIH): 67 mg/dL (ref 0–99)
Triglycerides: 121 mg/dL (ref 0–149)
VLDL Cholesterol Cal: 22 mg/dL (ref 5–40)

## 2021-03-04 LAB — CBC WITH DIFFERENTIAL/PLATELET
Basophils Absolute: 0 10*3/uL (ref 0.0–0.2)
Basos: 1 %
EOS (ABSOLUTE): 0.2 10*3/uL (ref 0.0–0.4)
Eos: 3 %
Hematocrit: 36.5 % (ref 34.0–46.6)
Hemoglobin: 12.1 g/dL (ref 11.1–15.9)
Immature Grans (Abs): 0 10*3/uL (ref 0.0–0.1)
Immature Granulocytes: 0 %
Lymphocytes Absolute: 2.3 10*3/uL (ref 0.7–3.1)
Lymphs: 29 %
MCH: 29.2 pg (ref 26.6–33.0)
MCHC: 33.2 g/dL (ref 31.5–35.7)
MCV: 88 fL (ref 79–97)
Monocytes Absolute: 0.5 10*3/uL (ref 0.1–0.9)
Monocytes: 6 %
Neutrophils Absolute: 5 10*3/uL (ref 1.4–7.0)
Neutrophils: 61 %
Platelets: 294 10*3/uL (ref 150–450)
RBC: 4.15 x10E6/uL (ref 3.77–5.28)
RDW: 13.4 % (ref 11.7–15.4)
WBC: 8.1 10*3/uL (ref 3.4–10.8)

## 2021-03-04 LAB — MICROALBUMIN / CREATININE URINE RATIO
Creatinine, Urine: 72.2 mg/dL
Microalb/Creat Ratio: 15 mg/g creat (ref 0–29)
Microalbumin, Urine: 10.6 ug/mL

## 2021-03-05 ENCOUNTER — Other Ambulatory Visit: Payer: Self-pay | Admitting: Family Medicine

## 2021-03-05 DIAGNOSIS — Z1231 Encounter for screening mammogram for malignant neoplasm of breast: Secondary | ICD-10-CM

## 2021-03-08 ENCOUNTER — Encounter: Payer: Self-pay | Admitting: Family Medicine

## 2021-03-09 DIAGNOSIS — Z515 Encounter for palliative care: Secondary | ICD-10-CM | POA: Diagnosis not present

## 2021-03-09 DIAGNOSIS — N184 Chronic kidney disease, stage 4 (severe): Secondary | ICD-10-CM | POA: Diagnosis not present

## 2021-03-16 ENCOUNTER — Other Ambulatory Visit: Payer: Self-pay | Admitting: Family Medicine

## 2021-03-16 DIAGNOSIS — R251 Tremor, unspecified: Secondary | ICD-10-CM

## 2021-03-16 DIAGNOSIS — Z1211 Encounter for screening for malignant neoplasm of colon: Secondary | ICD-10-CM | POA: Diagnosis not present

## 2021-03-24 LAB — COLOGUARD: COLOGUARD: NEGATIVE

## 2021-03-28 ENCOUNTER — Other Ambulatory Visit: Payer: Self-pay | Admitting: Family Medicine

## 2021-03-28 DIAGNOSIS — F339 Major depressive disorder, recurrent, unspecified: Secondary | ICD-10-CM

## 2021-03-31 ENCOUNTER — Inpatient Hospital Stay: Admission: RE | Admit: 2021-03-31 | Payer: Medicaid Other | Source: Ambulatory Visit

## 2021-04-25 ENCOUNTER — Other Ambulatory Visit: Payer: Self-pay | Admitting: Family Medicine

## 2021-04-25 DIAGNOSIS — Z794 Long term (current) use of insulin: Secondary | ICD-10-CM

## 2021-04-25 DIAGNOSIS — E1159 Type 2 diabetes mellitus with other circulatory complications: Secondary | ICD-10-CM

## 2021-04-25 DIAGNOSIS — E119 Type 2 diabetes mellitus without complications: Secondary | ICD-10-CM

## 2021-04-26 ENCOUNTER — Other Ambulatory Visit: Payer: Self-pay | Admitting: Family Medicine

## 2021-04-26 DIAGNOSIS — E1165 Type 2 diabetes mellitus with hyperglycemia: Secondary | ICD-10-CM

## 2021-05-28 ENCOUNTER — Other Ambulatory Visit: Payer: Self-pay | Admitting: Family Medicine

## 2021-05-28 DIAGNOSIS — R251 Tremor, unspecified: Secondary | ICD-10-CM

## 2021-05-28 DIAGNOSIS — I152 Hypertension secondary to endocrine disorders: Secondary | ICD-10-CM

## 2021-05-28 DIAGNOSIS — E1165 Type 2 diabetes mellitus with hyperglycemia: Secondary | ICD-10-CM

## 2021-06-04 ENCOUNTER — Other Ambulatory Visit: Payer: Self-pay | Admitting: Family Medicine

## 2021-06-04 DIAGNOSIS — R251 Tremor, unspecified: Secondary | ICD-10-CM

## 2021-06-11 ENCOUNTER — Other Ambulatory Visit: Payer: Self-pay | Admitting: Family Medicine

## 2021-06-11 DIAGNOSIS — E1165 Type 2 diabetes mellitus with hyperglycemia: Secondary | ICD-10-CM

## 2021-06-21 ENCOUNTER — Ambulatory Visit: Payer: Medicare Other

## 2021-06-22 ENCOUNTER — Ambulatory Visit (INDEPENDENT_AMBULATORY_CARE_PROVIDER_SITE_OTHER): Payer: Medicare Other

## 2021-06-22 VITALS — Wt 214.0 lb

## 2021-06-22 DIAGNOSIS — Z0000 Encounter for general adult medical examination without abnormal findings: Secondary | ICD-10-CM | POA: Diagnosis not present

## 2021-06-22 NOTE — Patient Instructions (Signed)
Tracy Farrell , Thank you for taking time to come for your Medicare Wellness Visit. I appreciate your ongoing commitment to your health goals. Please review the following plan we discussed and let me know if I can assist you in the future.   Screening recommendations/referrals: Colonoscopy: Cologuard done 03/16/2021 - repeat in 3 years Mammogram: Done 03/18/2020 - Repeat annually *This was already ordered - make appointment soon Bone Density: Done 03/20/2020 - Repeat every 2 years  Recommended yearly ophthalmology/optometry visit for glaucoma screening and checkup Recommended yearly dental visit for hygiene and checkup  Vaccinations: Influenza vaccine: Done 10/20/2020 - Repeat annually  Pneumococcal vaccine: Done 07/04/2019 & 09/03/2020 Tdap vaccine: Done 05/12/2020 - Repeat in 10 years Shingles vaccine: Done 07/04/2019 & 06/18/2020     Covid-19:Done  04/18/2019, 05/11/2019, & 01/23/2020  Advanced directives: Advance directive discussed with you today. Even though you declined this today, please call our office should you change your mind, and we can give you the proper paperwork for you to fill out.   Conditions/risks identified: Aim for 30 minutes of exercise or brisk walking, 6-8 glasses of water, and 5 servings of fruits and vegetables each day.   Next appointment: Follow up in one year for your annual wellness visit    Preventive Care 65 Years and Older, Female Preventive care refers to lifestyle choices and visits with your health care provider that can promote health and wellness. What does preventive care include? A yearly physical exam. This is also called an annual well check. Dental exams once or twice a year. Routine eye exams. Ask your health care provider how often you should have your eyes checked. Personal lifestyle choices, including: Daily care of your teeth and gums. Regular physical activity. Eating a healthy diet. Avoiding tobacco and drug use. Limiting alcohol  use. Practicing safe sex. Taking low-dose aspirin every day. Taking vitamin and mineral supplements as recommended by your health care provider. What happens during an annual well check? The services and screenings done by your health care provider during your annual well check will depend on your age, overall health, lifestyle risk factors, and family history of disease. Counseling  Your health care provider may ask you questions about your: Alcohol use. Tobacco use. Drug use. Emotional well-being. Home and relationship well-being. Sexual activity. Eating habits. History of falls. Memory and ability to understand (cognition). Work and work Statistician. Reproductive health. Screening  You may have the following tests or measurements: Height, weight, and BMI. Blood pressure. Lipid and cholesterol levels. These may be checked every 5 years, or more frequently if you are over 40 years old. Skin check. Lung cancer screening. You may have this screening every year starting at age 78 if you have a 30-pack-year history of smoking and currently smoke or have quit within the past 15 years. Fecal occult blood test (FOBT) of the stool. You may have this test every year starting at age 65. Flexible sigmoidoscopy or colonoscopy. You may have a sigmoidoscopy every 5 years or a colonoscopy every 10 years starting at age 54. Hepatitis C blood test. Hepatitis B blood test. Sexually transmitted disease (STD) testing. Diabetes screening. This is done by checking your blood sugar (glucose) after you have not eaten for a while (fasting). You may have this done every 1-3 years. Bone density scan. This is done to screen for osteoporosis. You may have this done starting at age 67. Mammogram. This may be done every 1-2 years. Talk to your health care provider about how  often you should have regular mammograms. Talk with your health care provider about your test results, treatment options, and if necessary,  the need for more tests. Vaccines  Your health care provider may recommend certain vaccines, such as: Influenza vaccine. This is recommended every year. Tetanus, diphtheria, and acellular pertussis (Tdap, Td) vaccine. You may need a Td booster every 10 years. Zoster vaccine. You may need this after age 70. Pneumococcal 13-valent conjugate (PCV13) vaccine. One dose is recommended after age 43. Pneumococcal polysaccharide (PPSV23) vaccine. One dose is recommended after age 57. Talk to your health care provider about which screenings and vaccines you need and how often you need them. This information is not intended to replace advice given to you by your health care provider. Make sure you discuss any questions you have with your health care provider. Document Released: 02/06/2015 Document Revised: 09/30/2015 Document Reviewed: 11/11/2014 Elsevier Interactive Patient Education  2017 [place] Prevention in the Home Falls can cause injuries. They can happen to people of all ages. There are many things you can do to make your home safe and to help prevent falls. What can I do on the outside of my home? Regularly fix the edges of walkways and driveways and fix any cracks. Remove anything that might make you trip as you walk through a door, such as a raised step or threshold. Trim any bushes or trees on the path to your home. Use bright outdoor lighting. Clear any walking paths of anything that might make someone trip, such as rocks or tools. Regularly check to see if handrails are loose or broken. Make sure that both sides of any steps have handrails. Any raised decks and porches should have guardrails on the edges. Have any leaves, snow, or ice cleared regularly. Use sand or salt on walking paths during winter. Clean up any spills in your garage right away. This includes oil or grease spills. What can I do in the bathroom? Use night lights. Install grab bars by the toilet and in the  tub and shower. Do not use towel bars as grab bars. Use non-skid mats or decals in the tub or shower. If you need to sit down in the shower, use a plastic, non-slip stool. Keep the floor dry. Clean up any water that spills on the floor as soon as it happens. Remove soap buildup in the tub or shower regularly. Attach bath mats securely with double-sided non-slip rug tape. Do not have throw rugs and other things on the floor that can make you trip. What can I do in the bedroom? Use night lights. Make sure that you have a light by your bed that is easy to reach. Do not use any sheets or blankets that are too big for your bed. They should not hang down onto the floor. Have a firm chair that has side arms. You can use this for support while you get dressed. Do not have throw rugs and other things on the floor that can make you trip. What can I do in the kitchen? Clean up any spills right away. Avoid walking on wet floors. Keep items that you use a lot in easy-to-reach places. If you need to reach something above you, use a strong step stool that has a grab bar. Keep electrical cords out of the way. Do not use floor polish or wax that makes floors slippery. If you must use wax, use non-skid floor wax. Do not have throw rugs and other things  on the floor that can make you trip. What can I do with my stairs? Do not leave any items on the stairs. Make sure that there are handrails on both sides of the stairs and use them. Fix handrails that are broken or loose. Make sure that handrails are as long as the stairways. Check any carpeting to make sure that it is firmly attached to the stairs. Fix any carpet that is loose or worn. Avoid having throw rugs at the top or bottom of the stairs. If you do have throw rugs, attach them to the floor with carpet tape. Make sure that you have a light switch at the top of the stairs and the bottom of the stairs. If you do not have them, ask someone to add them for  you. What else can I do to help prevent falls? Wear shoes that: Do not have high heels. Have rubber bottoms. Are comfortable and fit you well. Are closed at the toe. Do not wear sandals. If you use a stepladder: Make sure that it is fully opened. Do not climb a closed stepladder. Make sure that both sides of the stepladder are locked into place. Ask someone to hold it for you, if possible. Clearly mark and make sure that you can see: Any grab bars or handrails. First and last steps. Where the edge of each step is. Use tools that help you move around (mobility aids) if they are needed. These include: Canes. Walkers. Scooters. Crutches. Turn on the lights when you go into a dark area. Replace any light bulbs as soon as they burn out. Set up your furniture so you have a clear path. Avoid moving your furniture around. If any of your floors are uneven, fix them. If there are any pets around you, be aware of where they are. Review your medicines with your doctor. Some medicines can make you feel dizzy. This can increase your chance of falling. Ask your doctor what other things that you can do to help prevent falls. This information is not intended to replace advice given to you by your health care provider. Make sure you discuss any questions you have with your health care provider. Document Released: 11/06/2008 Document Revised: 06/18/2015 Document Reviewed: 02/14/2014 Elsevier Interactive Patient Education  2017 [employer].
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
## 2021-06-27 ENCOUNTER — Other Ambulatory Visit: Payer: Self-pay | Admitting: Family Medicine

## 2021-06-27 DIAGNOSIS — F339 Major depressive disorder, recurrent, unspecified: Secondary | ICD-10-CM

## 2021-07-27 ENCOUNTER — Other Ambulatory Visit: Payer: Self-pay | Admitting: Family Medicine

## 2021-07-27 DIAGNOSIS — R251 Tremor, unspecified: Secondary | ICD-10-CM

## 2021-07-27 DIAGNOSIS — E119 Type 2 diabetes mellitus without complications: Secondary | ICD-10-CM

## 2021-07-27 DIAGNOSIS — E1159 Type 2 diabetes mellitus with other circulatory complications: Secondary | ICD-10-CM

## 2021-08-22 ENCOUNTER — Other Ambulatory Visit: Payer: Self-pay | Admitting: Family Medicine

## 2021-08-22 DIAGNOSIS — Z87898 Personal history of other specified conditions: Secondary | ICD-10-CM

## 2021-08-22 DIAGNOSIS — I152 Hypertension secondary to endocrine disorders: Secondary | ICD-10-CM

## 2021-08-22 DIAGNOSIS — E1165 Type 2 diabetes mellitus with hyperglycemia: Secondary | ICD-10-CM

## 2021-08-22 DIAGNOSIS — G2581 Restless legs syndrome: Secondary | ICD-10-CM

## 2021-08-23 ENCOUNTER — Other Ambulatory Visit: Payer: Self-pay | Admitting: Family Medicine

## 2021-08-23 DIAGNOSIS — K219 Gastro-esophageal reflux disease without esophagitis: Secondary | ICD-10-CM

## 2021-08-26 ENCOUNTER — Other Ambulatory Visit: Payer: Self-pay | Admitting: Family Medicine

## 2021-08-26 DIAGNOSIS — N184 Chronic kidney disease, stage 4 (severe): Secondary | ICD-10-CM

## 2021-08-26 DIAGNOSIS — E1165 Type 2 diabetes mellitus with hyperglycemia: Secondary | ICD-10-CM

## 2021-08-31 ENCOUNTER — Ambulatory Visit: Payer: Medicare Other | Admitting: Family Medicine

## 2021-08-31 DIAGNOSIS — Z87898 Personal history of other specified conditions: Secondary | ICD-10-CM

## 2021-08-31 DIAGNOSIS — E1169 Type 2 diabetes mellitus with other specified complication: Secondary | ICD-10-CM

## 2021-08-31 DIAGNOSIS — Z8673 Personal history of transient ischemic attack (TIA), and cerebral infarction without residual deficits: Secondary | ICD-10-CM

## 2021-08-31 DIAGNOSIS — I152 Hypertension secondary to endocrine disorders: Secondary | ICD-10-CM

## 2021-08-31 DIAGNOSIS — E1165 Type 2 diabetes mellitus with hyperglycemia: Secondary | ICD-10-CM

## 2021-08-31 DIAGNOSIS — N184 Chronic kidney disease, stage 4 (severe): Secondary | ICD-10-CM

## 2021-09-03 ENCOUNTER — Other Ambulatory Visit: Payer: Self-pay | Admitting: Family Medicine

## 2021-09-03 DIAGNOSIS — R251 Tremor, unspecified: Secondary | ICD-10-CM

## 2021-09-05 ENCOUNTER — Other Ambulatory Visit: Payer: Self-pay | Admitting: Family Medicine

## 2021-09-05 DIAGNOSIS — E1169 Type 2 diabetes mellitus with other specified complication: Secondary | ICD-10-CM

## 2021-09-05 DIAGNOSIS — Z794 Long term (current) use of insulin: Secondary | ICD-10-CM

## 2021-09-05 DIAGNOSIS — Z8673 Personal history of transient ischemic attack (TIA), and cerebral infarction without residual deficits: Secondary | ICD-10-CM

## 2021-09-06 ENCOUNTER — Encounter: Payer: Self-pay | Admitting: Family Medicine

## 2021-09-06 NOTE — Telephone Encounter (Signed)
LEFT MESSAGE TO SCHEDULE APPT/

## 2021-09-06 NOTE — Telephone Encounter (Signed)
Needs 6 mos FU sometime in next 4-6 wks w/ new provider. Tracy Farrell. 30 day RF sent to pharmacy

## 2021-09-16 DIAGNOSIS — I129 Hypertensive chronic kidney disease with stage 1 through stage 4 chronic kidney disease, or unspecified chronic kidney disease: Secondary | ICD-10-CM | POA: Diagnosis not present

## 2021-09-16 DIAGNOSIS — N184 Chronic kidney disease, stage 4 (severe): Secondary | ICD-10-CM | POA: Diagnosis not present

## 2021-09-16 DIAGNOSIS — Z515 Encounter for palliative care: Secondary | ICD-10-CM | POA: Diagnosis not present

## 2021-09-21 ENCOUNTER — Ambulatory Visit (INDEPENDENT_AMBULATORY_CARE_PROVIDER_SITE_OTHER): Payer: Medicare Other | Admitting: Family Medicine

## 2021-09-21 ENCOUNTER — Other Ambulatory Visit: Payer: Self-pay | Admitting: Family Medicine

## 2021-09-21 ENCOUNTER — Encounter: Payer: Self-pay | Admitting: Family Medicine

## 2021-09-21 VITALS — BP 131/72 | HR 62 | Temp 96.4°F | Ht 62.0 in | Wt 221.8 lb

## 2021-09-21 DIAGNOSIS — F339 Major depressive disorder, recurrent, unspecified: Secondary | ICD-10-CM

## 2021-09-21 DIAGNOSIS — E1159 Type 2 diabetes mellitus with other circulatory complications: Secondary | ICD-10-CM | POA: Diagnosis not present

## 2021-09-21 DIAGNOSIS — M542 Cervicalgia: Secondary | ICD-10-CM

## 2021-09-21 DIAGNOSIS — L602 Onychogryphosis: Secondary | ICD-10-CM

## 2021-09-21 DIAGNOSIS — E559 Vitamin D deficiency, unspecified: Secondary | ICD-10-CM

## 2021-09-21 DIAGNOSIS — K219 Gastro-esophageal reflux disease without esophagitis: Secondary | ICD-10-CM

## 2021-09-21 DIAGNOSIS — E1165 Type 2 diabetes mellitus with hyperglycemia: Secondary | ICD-10-CM | POA: Diagnosis not present

## 2021-09-21 DIAGNOSIS — I152 Hypertension secondary to endocrine disorders: Secondary | ICD-10-CM | POA: Diagnosis not present

## 2021-09-21 DIAGNOSIS — Z794 Long term (current) use of insulin: Secondary | ICD-10-CM | POA: Diagnosis not present

## 2021-09-21 DIAGNOSIS — R251 Tremor, unspecified: Secondary | ICD-10-CM

## 2021-09-21 DIAGNOSIS — E785 Hyperlipidemia, unspecified: Secondary | ICD-10-CM | POA: Diagnosis not present

## 2021-09-21 DIAGNOSIS — E1169 Type 2 diabetes mellitus with other specified complication: Secondary | ICD-10-CM | POA: Diagnosis not present

## 2021-09-21 DIAGNOSIS — Z87898 Personal history of other specified conditions: Secondary | ICD-10-CM | POA: Diagnosis not present

## 2021-09-21 DIAGNOSIS — Z8673 Personal history of transient ischemic attack (TIA), and cerebral infarction without residual deficits: Secondary | ICD-10-CM | POA: Diagnosis not present

## 2021-09-21 DIAGNOSIS — E538 Deficiency of other specified B group vitamins: Secondary | ICD-10-CM

## 2021-09-21 DIAGNOSIS — G8929 Other chronic pain: Secondary | ICD-10-CM

## 2021-09-21 DIAGNOSIS — G2581 Restless legs syndrome: Secondary | ICD-10-CM | POA: Diagnosis not present

## 2021-09-21 DIAGNOSIS — N184 Chronic kidney disease, stage 4 (severe): Secondary | ICD-10-CM | POA: Diagnosis not present

## 2021-09-21 LAB — BAYER DCA HB A1C WAIVED: HB A1C (BAYER DCA - WAIVED): 6.9 % — ABNORMAL HIGH (ref 4.8–5.6)

## 2021-09-21 MED ORDER — PANTOPRAZOLE SODIUM 20 MG PO TBEC: 20.0000 mg | DELAYED_RELEASE_TABLET | Freq: Every day | ORAL | 1 refills | Status: DC

## 2021-09-21 MED ORDER — ATORVASTATIN CALCIUM 40 MG PO TABS: 40.0000 mg | ORAL_TABLET | Freq: Every day | ORAL | 1 refills | Status: DC

## 2021-09-21 MED ORDER — PROPRANOLOL HCL 10 MG PO TABS: 10.0000 mg | ORAL_TABLET | Freq: Two times a day (BID) | ORAL | 1 refills | Status: DC
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
## 2021-09-22 LAB — CBC WITH DIFFERENTIAL/PLATELET
Basophils Absolute: 0 10*3/uL (ref 0.0–0.2)
Basos: 0 %
EOS (ABSOLUTE): 0.2 10*3/uL (ref 0.0–0.4)
Eos: 3 %
Hematocrit: 38.6 % (ref 34.0–46.6)
Hemoglobin: 13 g/dL (ref 11.1–15.9)
Immature Grans (Abs): 0 10*3/uL (ref 0.0–0.1)
Immature Granulocytes: 0 %
Lymphocytes Absolute: 1.7 10*3/uL (ref 0.7–3.1)
Lymphs: 20 %
MCH: 30.2 pg (ref 26.6–33.0)
MCHC: 33.7 g/dL (ref 31.5–35.7)
MCV: 90 fL (ref 79–97)
Monocytes Absolute: 0.5 10*3/uL (ref 0.1–0.9)
Monocytes: 6 %
Neutrophils Absolute: 6 10*3/uL (ref 1.4–7.0)
Neutrophils: 71 %
Platelets: 315 10*3/uL (ref 150–450)
RBC: 4.31 x10E6/uL (ref 3.77–5.28)
RDW: 12.3 % (ref 11.7–15.4)
WBC: 8.6 10*3/uL (ref 3.4–10.8)

## 2021-09-22 LAB — CMP14+EGFR
ALT: 13 IU/L (ref 0–32)
AST: 11 IU/L (ref 0–40)
Albumin/Globulin Ratio: 1.6 (ref 1.2–2.2)
Albumin: 4.2 g/dL (ref 3.9–4.9)
Alkaline Phosphatase: 113 IU/L (ref 44–121)
BUN/Creatinine Ratio: 13 (ref 12–28)
BUN: 24 mg/dL (ref 8–27)
Bilirubin Total: 0.4 mg/dL (ref 0.0–1.2)
CO2: 25 mmol/L (ref 20–29)
Calcium: 9.6 mg/dL (ref 8.7–10.3)
Chloride: 97 mmol/L (ref 96–106)
Creatinine, Ser: 1.8 mg/dL — ABNORMAL HIGH (ref 0.57–1.00)
Globulin, Total: 2.6 g/dL (ref 1.5–4.5)
Glucose: 104 mg/dL — ABNORMAL HIGH (ref 70–99)
Potassium: 4.6 mmol/L (ref 3.5–5.2)
Sodium: 138 mmol/L (ref 134–144)
Total Protein: 6.8 g/dL (ref 6.0–8.5)
eGFR: 30 mL/min/{1.73_m2} — ABNORMAL LOW (ref >59)

## 2021-09-22 LAB — LIPID PANEL
Chol/HDL Ratio: 3.7 ratio (ref 0.0–4.4)
Cholesterol, Total: 146 mg/dL (ref 100–199)
HDL: 39 mg/dL — ABNORMAL LOW (ref >39)
LDL Chol Calc (NIH): 77 mg/dL (ref 0–99)
Triglycerides: 174 mg/dL — ABNORMAL HIGH (ref 0–149)
VLDL Cholesterol Cal: 30 mg/dL (ref 5–40)

## 2021-09-22 LAB — LEVETIRACETAM LEVEL: Levetiracetam Lvl: 32.3 ug/mL (ref 10.0–40.0)

## 2021-09-23 LAB — VITAMIN B12: Vitamin B-12: >2000 pg/mL — ABNORMAL HIGH (ref 232–1245)

## 2021-09-23 LAB — SPECIMEN STATUS REPORT

## 2021-09-29 ENCOUNTER — Inpatient Hospital Stay: Admission: RE | Admit: 2021-09-29 | Payer: Medicaid Other | Source: Ambulatory Visit

## 2021-10-19 ENCOUNTER — Telehealth: Payer: Self-pay | Admitting: Family Medicine

## 2021-10-19 DIAGNOSIS — E1165 Type 2 diabetes mellitus with hyperglycemia: Secondary | ICD-10-CM

## 2021-10-19 MED ORDER — TRESIBA FLEXTOUCH 200 UNIT/ML ~~LOC~~ SOPN: PEN_INJECTOR | SUBCUTANEOUS | 0 refills | Status: DC

## 2021-10-19 NOTE — Telephone Encounter (Signed)
  Prescription Request  10/19/2021  Is this a "Controlled Substance" medicine? no  Have you seen your PCP in the last 2 weeks? Appt 11/30  If YES, route message to pool  -  If NO, patient needs to be scheduled for appointment.  What is the name of the medication or equipment? TRESIBA FLEXTOUCH 200 UNIT/ML FlexTouch Pen  Have you contacted your pharmacy to request a refill? Yes, switching pharmacy    Which pharmacy would you like this sent to? [facility] [place]    Patient notified that their request is being sent to the clinical staff for review and that they should receive a response within 2 business days.

## 2021-10-19 NOTE — Telephone Encounter (Signed)
Pt aware Tracy Farrell sent to [facility]

## 2021-10-21 DIAGNOSIS — R2243 Localized swelling, mass and lump, lower limb, bilateral: Secondary | ICD-10-CM | POA: Diagnosis not present

## 2021-10-21 DIAGNOSIS — R252 Cramp and spasm: Secondary | ICD-10-CM | POA: Diagnosis not present

## 2021-10-21 DIAGNOSIS — I129 Hypertensive chronic kidney disease with stage 1 through stage 4 chronic kidney disease, or unspecified chronic kidney disease: Secondary | ICD-10-CM | POA: Diagnosis not present

## 2021-10-21 DIAGNOSIS — N184 Chronic kidney disease, stage 4 (severe): Secondary | ICD-10-CM | POA: Diagnosis not present

## 2021-10-21 DIAGNOSIS — Z515 Encounter for palliative care: Secondary | ICD-10-CM | POA: Diagnosis not present

## 2021-11-26 ENCOUNTER — Other Ambulatory Visit: Payer: Self-pay | Admitting: Family Medicine

## 2021-11-26 DIAGNOSIS — E1165 Type 2 diabetes mellitus with hyperglycemia: Secondary | ICD-10-CM

## 2021-12-08 DIAGNOSIS — N184 Chronic kidney disease, stage 4 (severe): Secondary | ICD-10-CM | POA: Diagnosis not present

## 2021-12-08 DIAGNOSIS — R2243 Localized swelling, mass and lump, lower limb, bilateral: Secondary | ICD-10-CM | POA: Diagnosis not present

## 2021-12-08 DIAGNOSIS — R252 Cramp and spasm: Secondary | ICD-10-CM | POA: Diagnosis not present

## 2021-12-08 DIAGNOSIS — Z515 Encounter for palliative care: Secondary | ICD-10-CM | POA: Diagnosis not present

## 2021-12-08 DIAGNOSIS — I129 Hypertensive chronic kidney disease with stage 1 through stage 4 chronic kidney disease, or unspecified chronic kidney disease: Secondary | ICD-10-CM | POA: Diagnosis not present

## 2021-12-08 DIAGNOSIS — Z634 Disappearance and death of family member: Secondary | ICD-10-CM | POA: Diagnosis not present

## 2021-12-23 ENCOUNTER — Ambulatory Visit (INDEPENDENT_AMBULATORY_CARE_PROVIDER_SITE_OTHER): Payer: Medicare Other | Admitting: Family Medicine

## 2021-12-23 ENCOUNTER — Encounter: Payer: Self-pay | Admitting: Family Medicine

## 2021-12-23 ENCOUNTER — Ambulatory Visit (INDEPENDENT_AMBULATORY_CARE_PROVIDER_SITE_OTHER): Payer: Medicare Other

## 2021-12-23 VITALS — BP 155/74 | HR 69 | Temp 96.9°F | Ht 62.0 in | Wt 224.6 lb

## 2021-12-23 DIAGNOSIS — Z23 Encounter for immunization: Secondary | ICD-10-CM

## 2021-12-23 DIAGNOSIS — E1159 Type 2 diabetes mellitus with other circulatory complications: Secondary | ICD-10-CM

## 2021-12-23 DIAGNOSIS — E538 Deficiency of other specified B group vitamins: Secondary | ICD-10-CM

## 2021-12-23 DIAGNOSIS — R251 Tremor, unspecified: Secondary | ICD-10-CM | POA: Diagnosis not present

## 2021-12-23 DIAGNOSIS — M1712 Unilateral primary osteoarthritis, left knee: Secondary | ICD-10-CM | POA: Diagnosis not present

## 2021-12-23 DIAGNOSIS — N184 Chronic kidney disease, stage 4 (severe): Secondary | ICD-10-CM | POA: Diagnosis not present

## 2021-12-23 DIAGNOSIS — M25562 Pain in left knee: Secondary | ICD-10-CM

## 2021-12-23 DIAGNOSIS — G2581 Restless legs syndrome: Secondary | ICD-10-CM | POA: Diagnosis not present

## 2021-12-23 DIAGNOSIS — E1165 Type 2 diabetes mellitus with hyperglycemia: Secondary | ICD-10-CM

## 2021-12-23 DIAGNOSIS — E785 Hyperlipidemia, unspecified: Secondary | ICD-10-CM | POA: Diagnosis not present

## 2021-12-23 DIAGNOSIS — Z794 Long term (current) use of insulin: Secondary | ICD-10-CM

## 2021-12-23 DIAGNOSIS — E1169 Type 2 diabetes mellitus with other specified complication: Secondary | ICD-10-CM | POA: Diagnosis not present

## 2021-12-23 DIAGNOSIS — I152 Hypertension secondary to endocrine disorders: Secondary | ICD-10-CM

## 2021-12-23 DIAGNOSIS — F339 Major depressive disorder, recurrent, unspecified: Secondary | ICD-10-CM

## 2021-12-23 LAB — BAYER DCA HB A1C WAIVED: HB A1C (BAYER DCA - WAIVED): 7.4 % — ABNORMAL HIGH (ref 4.8–5.6)

## 2021-12-23 MED ORDER — TIRZEPATIDE 2.5 MG/0.5ML ~~LOC~~ SOAJ: 2.5000 mg | SUBCUTANEOUS | 2 refills | Status: DC

## 2021-12-23 MED ORDER — CITALOPRAM HYDROBROMIDE 40 MG PO TABS: 40.0000 mg | ORAL_TABLET | Freq: Every day | ORAL | 2 refills | Status: DC

## 2021-12-23 MED ORDER — DAPAGLIFLOZIN PROPANEDIOL 10 MG PO TABS: 10.0000 mg | ORAL_TABLET | Freq: Every day | ORAL | 2 refills | Status: DC

## 2021-12-23 MED ORDER — FUROSEMIDE 40 MG PO TABS: 40.0000 mg | ORAL_TABLET | Freq: Every day | ORAL | 2 refills | Status: DC

## 2021-12-23 MED ORDER — PRAMIPEXOLE DIHYDROCHLORIDE 0.125 MG PO TABS: 0.1250 mg | ORAL_TABLET | Freq: Every day | ORAL | 2 refills | Status: DC

## 2021-12-23 MED ORDER — LISINOPRIL 2.5 MG PO TABS: 2.5000 mg | ORAL_TABLET | Freq: Every day | ORAL | 2 refills | Status: DC

## 2021-12-23 MED ORDER — PROPRANOLOL HCL 10 MG PO TABS: 10.0000 mg | ORAL_TABLET | Freq: Two times a day (BID) | ORAL | 2 refills | Status: DC
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
## 2021-12-23 NOTE — Patient Instructions (Addendum)

## 2021-12-24 LAB — LIPID PANEL
Chol/HDL Ratio: 3.1 ratio (ref 0.0–4.4)
Cholesterol, Total: 135 mg/dL (ref 100–199)
HDL: 43 mg/dL (ref >39)
LDL Chol Calc (NIH): 68 mg/dL (ref 0–99)
Triglycerides: 134 mg/dL (ref 0–149)
VLDL Cholesterol Cal: 24 mg/dL (ref 5–40)

## 2021-12-24 LAB — CMP14+EGFR
ALT: 14 IU/L (ref 0–32)
AST: 12 IU/L (ref 0–40)
Albumin/Globulin Ratio: 1.8 (ref 1.2–2.2)
Albumin: 4.3 g/dL (ref 3.9–4.9)
Alkaline Phosphatase: 106 IU/L (ref 44–121)
BUN/Creatinine Ratio: 13 (ref 12–28)
BUN: 24 mg/dL (ref 8–27)
Bilirubin Total: 0.3 mg/dL (ref 0.0–1.2)
CO2: 20 mmol/L (ref 20–29)
Calcium: 9.4 mg/dL (ref 8.7–10.3)
Chloride: 100 mmol/L (ref 96–106)
Creatinine, Ser: 1.83 mg/dL — ABNORMAL HIGH (ref 0.57–1.00)
Globulin, Total: 2.4 g/dL (ref 1.5–4.5)
Glucose: 85 mg/dL (ref 70–99)
Potassium: 4.1 mmol/L (ref 3.5–5.2)
Sodium: 140 mmol/L (ref 134–144)
Total Protein: 6.7 g/dL (ref 6.0–8.5)
eGFR: 30 mL/min/{1.73_m2} — ABNORMAL LOW (ref >59)

## 2021-12-24 LAB — CBC WITH DIFFERENTIAL/PLATELET
Basophils Absolute: 0 10*3/uL (ref 0.0–0.2)
Basos: 1 %
EOS (ABSOLUTE): 0.2 10*3/uL (ref 0.0–0.4)
Eos: 3 %
Hematocrit: 36.9 % (ref 34.0–46.6)
Hemoglobin: 12.2 g/dL (ref 11.1–15.9)
Immature Grans (Abs): 0 10*3/uL (ref 0.0–0.1)
Immature Granulocytes: 0 %
Lymphocytes Absolute: 1.7 10*3/uL (ref 0.7–3.1)
Lymphs: 24 %
MCH: 29.6 pg (ref 26.6–33.0)
MCHC: 33.1 g/dL (ref 31.5–35.7)
MCV: 90 fL (ref 79–97)
Monocytes Absolute: 0.5 10*3/uL (ref 0.1–0.9)
Monocytes: 7 %
Neutrophils Absolute: 4.5 10*3/uL (ref 1.4–7.0)
Neutrophils: 65 %
Platelets: 293 10*3/uL (ref 150–450)
RBC: 4.12 x10E6/uL (ref 3.77–5.28)
RDW: 12.7 % (ref 11.7–15.4)
WBC: 7 10*3/uL (ref 3.4–10.8)

## 2021-12-24 LAB — VITAMIN B12: Vitamin B-12: 1180 pg/mL (ref 232–1245)

## 2021-12-27 NOTE — Progress Notes (Signed)
Patient returning call. Please call back

## 2021-12-29 ENCOUNTER — Telehealth: Payer: Self-pay | Admitting: *Deleted

## 2021-12-29 DIAGNOSIS — Z87898 Personal history of other specified conditions: Secondary | ICD-10-CM

## 2021-12-29 MED ORDER — LEVETIRACETAM 500 MG PO TABS: 500.0000 mg | ORAL_TABLET | Freq: Two times a day (BID) | ORAL | 0 refills | Status: DC

## 2021-12-29 NOTE — Telephone Encounter (Signed)
TC from [facility], pt has changed pharmacy's Request for Keppra which had no RFs on bottle from [facility] to transfer RF sent pt seen on 12/23/21 Next OV 03/24/22

## 2021-12-30 NOTE — Addendum Note (Signed)
Addended by: Baruch Gouty on: 12/30/2021 07:56 AM   Modules accepted: Orders

## 2022-01-10 ENCOUNTER — Telehealth: Payer: Self-pay | Admitting: Family Medicine

## 2022-01-10 DIAGNOSIS — Z794 Long term (current) use of insulin: Secondary | ICD-10-CM

## 2022-01-10 DIAGNOSIS — E1159 Type 2 diabetes mellitus with other circulatory complications: Secondary | ICD-10-CM

## 2022-01-10 MED ORDER — ASPIRIN 81 MG PO TBEC: 81.0000 mg | DELAYED_RELEASE_TABLET | Freq: Every day | ORAL | 1 refills | Status: DC

## 2022-01-10 NOTE — Telephone Encounter (Signed)
April called from [facility] in [place] stating that pt is trying to get a refill on her Aspirin 81mg  Rx but says she needs someone to send the Rx to her because pt has never got that Rx there before so its not on file.

## 2022-01-10 NOTE — Telephone Encounter (Signed)
Rx sent to [facility] pharmacy

## 2022-01-11 ENCOUNTER — Other Ambulatory Visit: Payer: Self-pay | Admitting: Family Medicine

## 2022-01-11 DIAGNOSIS — E1165 Type 2 diabetes mellitus with hyperglycemia: Secondary | ICD-10-CM

## 2022-01-27 ENCOUNTER — Ambulatory Visit: Payer: Medicaid Other | Admitting: Orthopedic Surgery

## 2022-02-08 ENCOUNTER — Other Ambulatory Visit: Payer: Self-pay | Admitting: Family Medicine

## 2022-02-08 DIAGNOSIS — E1165 Type 2 diabetes mellitus with hyperglycemia: Secondary | ICD-10-CM

## 2022-03-03 ENCOUNTER — Telehealth: Payer: Self-pay | Admitting: Family Medicine

## 2022-03-03 ENCOUNTER — Other Ambulatory Visit: Payer: Self-pay | Admitting: *Deleted

## 2022-03-03 DIAGNOSIS — Z794 Long term (current) use of insulin: Secondary | ICD-10-CM

## 2022-03-03 MED ORDER — TRESIBA FLEXTOUCH 200 UNIT/ML ~~LOC~~ SOPN: PEN_INJECTOR | SUBCUTANEOUS | 0 refills | Status: DC

## 2022-03-03 NOTE — Telephone Encounter (Signed)
Called pt back - stated she hit her hand in her kitchen at the beginning of the week , is concerned about one part if the cut that is not healing - I got her a apt in the morning with Dr D

## 2022-03-04 ENCOUNTER — Encounter: Payer: Self-pay | Admitting: Family Medicine

## 2022-03-04 ENCOUNTER — Ambulatory Visit (INDEPENDENT_AMBULATORY_CARE_PROVIDER_SITE_OTHER): Payer: 59 | Admitting: Family Medicine

## 2022-03-04 VITALS — BP 115/62 | HR 68 | Ht 62.0 in | Wt 213.0 lb

## 2022-03-04 DIAGNOSIS — W19XXXA Unspecified fall, initial encounter: Secondary | ICD-10-CM

## 2022-03-04 DIAGNOSIS — S61412A Laceration without foreign body of left hand, initial encounter: Secondary | ICD-10-CM | POA: Diagnosis not present

## 2022-03-04 NOTE — Progress Notes (Signed)
BP 115/62   Pulse 68   Ht 5' 2"$  (1.575 m)   Wt 213 lb (96.6 kg)   SpO2 98%   BMI 38.96 kg/m    Subjective:   Patient ID: Tracy Farrell, female    DOB: 06-14-1955, 67 y.o.   MRN: [id number]  HPI: Tracy Farrell is a 67 y.o. female presenting on 03/04/2022 for Extremity Laceration (Left. Fell on Sunday. Unsure what she hit. Cleaning and applying neosporin with no improvement. Painful.)   HPI Fall and skin tear Patient is coming in today for follow-up skin care.  She fell about 5 days ago over her dog that was chasing her other dog.  She says she does not know when she hit it on the back of her left hand she must of hit her hand on something and she has been using antibiotic cream but a couple days ago it started getting more red but now it is starting to look better.  She says she also bruised her left knee but she is able to walk on it and she says the swelling and bruising is gone down significantly and she is feeling a lot better about that.  Relevant past medical, surgical, family and social history reviewed and updated as indicated. Interim medical history since our last visit reviewed. Allergies and medications reviewed and updated.  Review of Systems  Constitutional:  Negative for chills and fever.  Eyes:  Negative for visual disturbance.  Respiratory:  Negative for chest tightness and shortness of breath.   Cardiovascular:  Negative for chest pain and leg swelling.  Skin:  Positive for wound. Negative for rash.  Neurological:  Negative for light-headedness and headaches.  Psychiatric/Behavioral:  Negative for agitation and behavioral problems.   All other systems reviewed and are negative.   Per HPI unless specifically indicated above   Allergies as of 03/04/2022       Reactions   Sulfa Antibiotics Other (See Comments)   UNKNOWN REACTION        Medication List        Accurate as of March 04, 2022  9:57 AM. If you have any questions, ask your nurse or doctor.           aspirin EC 81 MG tablet Commonly known as: EQ Aspirin Adult Low Dose Take 1 tablet (81 mg total) by mouth daily. Swallow whole.   atorvastatin 40 MG tablet Commonly known as: LIPITOR Take 1 tablet (40 mg total) by mouth daily at 6 PM.   cholecalciferol 25 MCG (1000 UNIT) tablet Commonly known as: VITAMIN D3 Take 1,000 Units by mouth daily.   citalopram 40 MG tablet Commonly known as: CELEXA Take 1 tablet (40 mg total) by mouth daily.   cyanocobalamin 1000 MCG tablet Commonly known as: VITAMIN B12 Take 1,000 mcg by mouth daily.   dapagliflozin propanediol 10 MG Tabs tablet Commonly known as: Farxiga Take 1 tablet (10 mg total) by mouth daily before breakfast.   furosemide 40 MG tablet Commonly known as: LASIX Take 1 tablet (40 mg total) by mouth daily.   glucose blood test strip Use as instructed   Insulin Pen Needle 32G X 4 MM Misc 1 application by Does not apply route in the morning, at noon, in the evening, and at bedtime.   IRON PO Take by mouth daily. Patient unsure of dose   levETIRAcetam 500 MG tablet Commonly known as: KEPPRA Take 1 tablet (500 mg total) by mouth 2 (two) times daily.  lisinopril 2.5 MG tablet Commonly known as: ZESTRIL Take 1 tablet (2.5 mg total) by mouth daily.   magnesium oxide 400 MG tablet Commonly known as: MAG-OX Take 400 mg by mouth daily.   Mounjaro 2.5 MG/0.5ML Pen Generic drug: tirzepatide Inject 2.5 mg into the skin once a week.   pantoprazole 20 MG tablet Commonly known as: PROTONIX Take 1 tablet (20 mg total) by mouth daily.   pramipexole 0.125 MG tablet Commonly known as: MIRAPEX Take 1 tablet (0.125 mg total) by mouth at bedtime.   propranolol 10 MG tablet Commonly known as: INDERAL Take 1 tablet (10 mg total) by mouth 2 (two) times daily.   [name] FlexTouch 200 UNIT/ML FlexTouch Pen Generic drug: insulin degludec INJECT 50 UNITS SUBCUTANEOUSLY IN THE MORNING         Objective:   BP  115/62   Pulse 68   Ht 5' 2"$  (1.575 m)   Wt 213 lb (96.6 kg)   SpO2 98%   BMI 38.96 kg/m   Wt Readings from Last 3 Encounters:  03/04/22 213 lb (96.6 kg)  12/23/21 224 lb 9.6 oz (101.9 kg)  09/21/21 221 lb 12.8 oz (100.6 kg)    Physical Exam Vitals and nursing note reviewed.  Constitutional:      General: She is not in acute distress.    Appearance: She is well-developed. She is not diaphoretic.  Eyes:     Conjunctiva/sclera: Conjunctivae normal.  Skin:    General: Skin is warm and dry.     Findings: Laceration present. No rash.  Neurological:     Mental Status: She is alert and oriented to person, place, and time.     Coordination: Coordination normal.  Psychiatric:        Behavior: Behavior normal.       Assessment & Plan:   Problem List Items Addressed This Visit   None Visit Diagnoses     Skin tear of left hand without complication, initial encounter    -  Primary       Trimmed back the excess skin of the skin tear simply patient tolerated well.  Recommended continuing using the antibiotic cream and Vaseline to help with moisture and will develop any signs of infection arise. Follow up plan: Return if symptoms worsen or fail to improve.  Counseling provided for all of the vaccine components No orders of the defined types were placed in this encounter.   Caryl Pina, MD [facility] 03/04/2022, 9:57 AM

## 2022-03-13 ENCOUNTER — Other Ambulatory Visit: Payer: Self-pay | Admitting: Family Medicine

## 2022-03-13 DIAGNOSIS — E1165 Type 2 diabetes mellitus with hyperglycemia: Secondary | ICD-10-CM

## 2022-03-17 ENCOUNTER — Telehealth: Payer: Self-pay | Admitting: Family Medicine

## 2022-03-17 DIAGNOSIS — E1165 Type 2 diabetes mellitus with hyperglycemia: Secondary | ICD-10-CM

## 2022-03-17 MED ORDER — MOUNJARO 2.5 MG/0.5ML ~~LOC~~ SOAJ: SUBCUTANEOUS | 0 refills | Status: DC

## 2022-03-17 NOTE — Telephone Encounter (Signed)
Pt aware refill sent to pharmacy 

## 2022-03-17 NOTE — Telephone Encounter (Signed)
  Prescription Request  03/17/2022  Is this a "Controlled Substance" medicine? no  Have you seen your PCP in the last 2 weeks? Appt 2/29  If YES, route message to pool  -  If NO, patient needs to be scheduled for appointment.  What is the name of the medication or equipment?  tirzepatide [facility]) 2.5 MG/0.5ML Pen   Have you contacted your pharmacy to request a refill? Yes, switched pharmcy   Which pharmacy would you like this sent to? [facility] in [place]   Patient notified that their request is being sent to the clinical staff for review and that they should receive a response within 2 business days.

## 2022-03-24 ENCOUNTER — Encounter: Payer: Self-pay | Admitting: Family Medicine

## 2022-03-24 ENCOUNTER — Ambulatory Visit (INDEPENDENT_AMBULATORY_CARE_PROVIDER_SITE_OTHER): Payer: 59

## 2022-03-24 ENCOUNTER — Encounter: Payer: Self-pay | Admitting: Radiology

## 2022-03-24 ENCOUNTER — Ambulatory Visit (INDEPENDENT_AMBULATORY_CARE_PROVIDER_SITE_OTHER): Payer: 59 | Admitting: Family Medicine

## 2022-03-24 VITALS — BP 124/64 | HR 71 | Temp 97.3°F | Ht 62.0 in | Wt 214.4 lb

## 2022-03-24 DIAGNOSIS — I152 Hypertension secondary to endocrine disorders: Secondary | ICD-10-CM

## 2022-03-24 DIAGNOSIS — E1165 Type 2 diabetes mellitus with hyperglycemia: Secondary | ICD-10-CM | POA: Diagnosis not present

## 2022-03-24 DIAGNOSIS — M25562 Pain in left knee: Secondary | ICD-10-CM | POA: Diagnosis not present

## 2022-03-24 DIAGNOSIS — Z87898 Personal history of other specified conditions: Secondary | ICD-10-CM | POA: Diagnosis not present

## 2022-03-24 DIAGNOSIS — E785 Hyperlipidemia, unspecified: Secondary | ICD-10-CM

## 2022-03-24 DIAGNOSIS — Z794 Long term (current) use of insulin: Secondary | ICD-10-CM | POA: Diagnosis not present

## 2022-03-24 DIAGNOSIS — E1159 Type 2 diabetes mellitus with other circulatory complications: Secondary | ICD-10-CM

## 2022-03-24 DIAGNOSIS — K219 Gastro-esophageal reflux disease without esophagitis: Secondary | ICD-10-CM | POA: Diagnosis not present

## 2022-03-24 DIAGNOSIS — N184 Chronic kidney disease, stage 4 (severe): Secondary | ICD-10-CM

## 2022-03-24 DIAGNOSIS — E1169 Type 2 diabetes mellitus with other specified complication: Secondary | ICD-10-CM

## 2022-03-24 DIAGNOSIS — H6993 Unspecified Eustachian tube disorder, bilateral: Secondary | ICD-10-CM

## 2022-03-24 DIAGNOSIS — Z8673 Personal history of transient ischemic attack (TIA), and cerebral infarction without residual deficits: Secondary | ICD-10-CM | POA: Diagnosis not present

## 2022-03-24 DIAGNOSIS — Z79899 Other long term (current) drug therapy: Secondary | ICD-10-CM

## 2022-03-24 LAB — BAYER DCA HB A1C WAIVED: HB A1C (BAYER DCA - WAIVED): 6.2 % — ABNORMAL HIGH (ref 4.8–5.6)

## 2022-03-24 MED ORDER — PANTOPRAZOLE SODIUM 20 MG PO TBEC: 20.0000 mg | DELAYED_RELEASE_TABLET | Freq: Every day | ORAL | 1 refills | Status: DC

## 2022-03-24 MED ORDER — ATORVASTATIN CALCIUM 40 MG PO TABS: 40.0000 mg | ORAL_TABLET | Freq: Every day | ORAL | 1 refills | Status: DC

## 2022-03-24 MED ORDER — LEVETIRACETAM 500 MG PO TABS: 500.0000 mg | ORAL_TABLET | Freq: Two times a day (BID) | ORAL | 1 refills | Status: DC

## 2022-03-24 MED ORDER — FLUTICASONE PROPIONATE 50 MCG/ACT NA SUSP: 2.0000 | Freq: Every day | NASAL | 6 refills | Status: DC

## 2022-03-24 MED ORDER — MOUNJARO 2.5 MG/0.5ML ~~LOC~~ SOAJ: SUBCUTANEOUS | 1 refills | Status: DC

## 2022-03-24 NOTE — Patient Instructions (Signed)
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
## 2022-03-25 LAB — LEVETIRACETAM LEVEL: Levetiracetam Lvl: 40 ug/mL (ref 10.0–40.0)

## 2022-03-25 LAB — CMP14+EGFR
ALT: 12 IU/L (ref 0–32)
AST: 15 IU/L (ref 0–40)
Albumin/Globulin Ratio: 1.6 (ref 1.2–2.2)
Albumin: 4.1 g/dL (ref 3.9–4.9)
Alkaline Phosphatase: 115 IU/L (ref 44–121)
BUN/Creatinine Ratio: 8 — ABNORMAL LOW (ref 12–28)
BUN: 16 mg/dL (ref 8–27)
Bilirubin Total: 0.3 mg/dL (ref 0.0–1.2)
CO2: 23 mmol/L (ref 20–29)
Calcium: 9.4 mg/dL (ref 8.7–10.3)
Chloride: 98 mmol/L (ref 96–106)
Creatinine, Ser: 2.08 mg/dL — ABNORMAL HIGH (ref 0.57–1.00)
Globulin, Total: 2.5 g/dL (ref 1.5–4.5)
Glucose: 100 mg/dL — ABNORMAL HIGH (ref 70–99)
Potassium: 4.8 mmol/L (ref 3.5–5.2)
Sodium: 139 mmol/L (ref 134–144)
Total Protein: 6.6 g/dL (ref 6.0–8.5)
eGFR: 26 mL/min/{1.73_m2} — ABNORMAL LOW (ref >59)

## 2022-03-25 NOTE — Addendum Note (Signed)
Addended by: Baruch Gouty on: 03/25/2022 08:39 AM   Modules accepted: Orders

## 2022-03-26 LAB — MICROALBUMIN / CREATININE URINE RATIO
Creatinine, Urine: 208 mg/dL
Microalb/Creat Ratio: 23 mg/g creat (ref 0–29)
Microalbumin, Urine: 47.6 ug/mL

## 2022-05-10 ENCOUNTER — Other Ambulatory Visit: Payer: Self-pay | Admitting: Family Medicine

## 2022-05-10 DIAGNOSIS — E1165 Type 2 diabetes mellitus with hyperglycemia: Secondary | ICD-10-CM

## 2022-05-17 ENCOUNTER — Other Ambulatory Visit: Payer: Self-pay | Admitting: Family Medicine

## 2022-05-17 DIAGNOSIS — Z794 Long term (current) use of insulin: Secondary | ICD-10-CM

## 2022-05-17 DIAGNOSIS — I152 Hypertension secondary to endocrine disorders: Secondary | ICD-10-CM

## 2022-05-31 ENCOUNTER — Telehealth: Payer: Self-pay | Admitting: Family Medicine

## 2022-05-31 DIAGNOSIS — Z0289 Encounter for other administrative examinations: Secondary | ICD-10-CM

## 2022-05-31 NOTE — Telephone Encounter (Signed)
Patient dropped off Handicap forms to be completed and signed.  Form Fee Paid? (Y/N)       Y     If NO, form is placed on front office manager desk to hold until payment received. If YES, then form will be placed in the RX/HH Nurse Coordinators box for completion.  Form will not be processed until payment is received

## 2022-06-01 ENCOUNTER — Other Ambulatory Visit: Payer: Self-pay | Admitting: Family Medicine

## 2022-06-01 DIAGNOSIS — H6993 Unspecified Eustachian tube disorder, bilateral: Secondary | ICD-10-CM

## 2022-06-01 NOTE — Telephone Encounter (Signed)
Pt aware handicap form ready for pickup

## 2022-06-23 ENCOUNTER — Encounter: Payer: Self-pay | Admitting: Family Medicine

## 2022-06-23 ENCOUNTER — Ambulatory Visit: Payer: 59 | Admitting: Family Medicine

## 2022-06-24 ENCOUNTER — Ambulatory Visit (INDEPENDENT_AMBULATORY_CARE_PROVIDER_SITE_OTHER): Payer: 59

## 2022-06-24 VITALS — Wt 214.0 lb

## 2022-06-24 DIAGNOSIS — Z0000 Encounter for general adult medical examination without abnormal findings: Secondary | ICD-10-CM

## 2022-06-24 NOTE — Patient Instructions (Signed)
Tracy Farrell , Thank you for taking time to come for your Medicare Wellness Visit. I appreciate your ongoing commitment to your health goals. Please review the following plan we discussed and let me know if I can assist you in the future.   These are the goals we discussed:  Goals      Patient Stated     Get more active     Urinary Incontinence Identified     Participate in pelvic health physical therapy to improve urinary incontinence.         This is a list of the screening recommended for you and due dates:  Health Maintenance  Topic Date Due   Mammogram  03/18/2021   Eye exam for diabetics  06/04/2021   DEXA scan (bone density measurement)  03/24/2023*   COVID-19 Vaccine (4 - 2023-24 season) 07/10/2023*   Flu Shot  08/25/2022   Complete foot exam   09/22/2022   Hemoglobin A1C  09/22/2022   Yearly kidney function blood test for diabetes  03/24/2023   Yearly kidney health urinalysis for diabetes  03/24/2023   Medicare Annual Wellness Visit  06/24/2023   Cologuard (Stool DNA test)  03/16/2024   DTaP/Tdap/Td vaccine (2 - Td or Tdap) 05/13/2030   Pneumonia Vaccine  Completed   Hepatitis C Screening  Completed   Zoster (Shingles) Vaccine  Completed   HPV Vaccine  Aged Out   Colon Cancer Screening  Discontinued  *Topic was postponed. The date shown is not the original due date.    Advanced directives: Please bring a copy of your health care power of attorney and living will to the office to be added to your chart at your convenience.   Conditions/risks identified: Diabetes Mellitus and Nutrition, Adult When you have diabetes, or diabetes mellitus, it is very important to have healthy eating habits because your blood sugar (glucose) levels are greatly affected by what you eat and drink. Eating healthy foods in the right amounts, at about the same times every day, can help you: Manage your blood glucose. Lower your risk of heart disease. Improve your blood pressure. Reach or  maintain a healthy weight. What can affect my meal plan? Every person with diabetes is different, and each person has different needs for a meal plan. Your health care provider may recommend that you work with a dietitian to make a meal plan that is best for you. Your meal plan may vary depending on factors such as: The calories you need. The medicines you take. Your weight. Your blood glucose, blood pressure, and cholesterol levels. Your activity level. Other health conditions you have, such as heart or kidney disease. How do carbohydrates affect me? Carbohydrates, also called carbs, affect your blood glucose level more than any other type of food. Eating carbs raises the amount of glucose in your blood. It is important to know how many carbs you can safely have in each meal. This is different for every person. Your dietitian can help you calculate how many carbs you should have at each meal and for each snack. How does alcohol affect me? Alcohol can cause a decrease in blood glucose (hypoglycemia), especially if you use insulin or take certain diabetes medicines by mouth. Hypoglycemia can be a life-threatening condition. Symptoms of hypoglycemia, such as sleepiness, dizziness, and confusion, are similar to symptoms of having too much alcohol. Do not drink alcohol if: Your health care provider tells you not to drink. You are pregnant, may be pregnant, or are planning  to become pregnant. If you drink alcohol: Limit how much you have to: 0-1 drink a day for women. 0-2 drinks a day for men. Know how much alcohol is in your drink. In the [place] one drink equals one 12 oz bottle of beer (355 mL), one 5 oz glass of wine (148 mL), or one 1 oz glass of hard liquor (44 mL). Keep yourself hydrated with water, diet soda, or unsweetened iced tea. Keep in mind that regular soda, juice, and other mixers may contain a lot of sugar and must be counted as carbs. What are tips for following this  plan?  Reading food labels Start by checking the serving size on the Nutrition Facts label of packaged foods and drinks. The number of calories and the amount of carbs, fats, and other nutrients listed on the label are based on one serving of the item. Many items contain more than one serving per package. Check the total grams (g) of carbs in one serving. Check the number of grams of saturated fats and trans fats in one serving. Choose foods that have a low amount or none of these fats. Check the number of milligrams (mg) of salt (sodium) in one serving. Most people should limit total sodium intake to less than 2,300 mg per day. Always check the nutrition information of foods labeled as "low-fat" or "nonfat." These foods may be higher in added sugar or refined carbs and should be avoided. Talk to your dietitian to identify your daily goals for nutrients listed on the label. Shopping Avoid buying canned, pre-made, or processed foods. These foods tend to be high in fat, sodium, and added sugar. Shop around the outside edge of the grocery store. This is where you will most often find fresh fruits and vegetables, bulk grains, fresh meats, and fresh dairy products. Cooking Use low-heat cooking methods, such as baking, instead of high-heat cooking methods, such as deep frying. Cook using healthy oils, such as olive, canola, or sunflower oil. Avoid cooking with butter, cream, or high-fat meats. Meal planning Eat meals and snacks regularly, preferably at the same times every day. Avoid going long periods of time without eating. Eat foods that are high in fiber, such as fresh fruits, vegetables, beans, and whole grains. Eat 4-6 oz (112-168 g) of lean protein each day, such as lean meat, chicken, fish, eggs, or tofu. One ounce (oz) (28 g) of lean protein is equal to: 1 oz (28 g) of meat, chicken, or fish. 1 egg.  cup (62 g) of tofu. Eat some foods each day that contain healthy fats, such as avocado,  nuts, seeds, and fish. What foods should I eat? Fruits Berries. Apples. Oranges. Peaches. Apricots. Plums. Grapes. Mangoes. Papayas. Pomegranates. Kiwi. Cherries. Vegetables Leafy greens, including lettuce, spinach, kale, chard, collard greens, mustard greens, and cabbage. Beets. Cauliflower. Broccoli. Carrots. Green beans. Tomatoes. Peppers. Onions. Cucumbers. Brussels sprouts. Grains Whole grains, such as whole-wheat or whole-grain bread, crackers, tortillas, cereal, and pasta. Unsweetened oatmeal. Quinoa. Brown or wild rice. Meats and other proteins Seafood. Poultry without skin. Lean cuts of poultry and beef. Tofu. Nuts. Seeds. Dairy Low-fat or fat-free dairy products such as milk, yogurt, and cheese. The items listed above may not be a complete list of foods and beverages you can eat and drink. Contact a dietitian for more information. What foods should I avoid? Fruits Fruits canned with syrup. Vegetables Canned vegetables. Frozen vegetables with butter or cream sauce. Grains Refined white flour and flour products such as bread,  pasta, snack foods, and cereals. Avoid all processed foods. Meats and other proteins Fatty cuts of meat. Poultry with skin. Breaded or fried meats. Processed meat. Avoid saturated fats. Dairy Full-fat yogurt, cheese, or milk. Beverages Sweetened drinks, such as soda or iced tea. The items listed above may not be a complete list of foods and beverages you should avoid. Contact a dietitian for more information. Questions to ask a health care provider Do I need to meet with a certified diabetes care and education specialist? Do I need to meet with a dietitian? What number can I call if I have questions? When are the best times to check my blood glucose? Where to find more information: American Diabetes Association: diabetes.org Academy of Nutrition and Dietetics: eatright.[employer] of Diabetes and Digestive and Kidney Diseases:  [employer]Sync[employer]si Association of Diabetes Care & Education Specialists: diabeteseducator.org Summary It is important to have healthy eating habits because your blood sugar (glucose) levels are greatly affected by what you eat and drink. It is important to use alcohol carefully. A healthy meal plan will help you manage your blood glucose and lower your risk of heart disease. Your health care provider may recommend that you work with a dietitian to make a meal plan that is best for you. This information is not intended to replace advice given to you by your health care provider. Make sure you discuss any questions you have with your health care provider. Document Revised: 08/14/2019 Document Reviewed: 08/14/2019 Elsevier Patient Education  2024 Elsevier Inc.   Next appointment: Follow up in one year for your annual wellness visit 06/24/23   Preventive Care 65 Years and Older, Female Preventive care refers to lifestyle choices and visits with your health care provider that can promote health and wellness. What does preventive care include? A yearly physical exam. This is also called an annual well check. Dental exams once or twice a year. Routine eye exams. Ask your health care provider how often you should have your eyes checked. Personal lifestyle choices, including: Daily care of your teeth and gums. Regular physical activity. Eating a healthy diet. Avoiding tobacco and drug use. Limiting alcohol use. Practicing safe sex. Taking low-dose aspirin every day. Taking vitamin and mineral supplements as recommended by your health care provider. What happens during an annual well check? The services and screenings done by your health care provider during your annual well check will depend on your age, overall health, lifestyle risk factors, and family history of disease. Counseling  Your health care provider may ask you questions about your: Alcohol use. Tobacco use. Drug use. Emotional  well-being. Home and relationship well-being. Sexual activity. Eating habits. History of falls. Memory and ability to understand (cognition). Work and work Astronomer. Reproductive health. Screening  You may have the following tests or measurements: Height, weight, and BMI. Blood pressure. Lipid and cholesterol levels. These may be checked every 5 years, or more frequently if you are over 60 years old. Skin check. Lung cancer screening. You may have this screening every year starting at age 27 if you have a 30-pack-year history of smoking and currently smoke or have quit within the past 15 years. Fecal occult blood test (FOBT) of the stool. You may have this test every year starting at age 87. Flexible sigmoidoscopy or colonoscopy. You may have a sigmoidoscopy every 5 years or a colonoscopy every 10 years starting at age 60. Hepatitis C blood test. Hepatitis B blood test. Sexually transmitted disease (STD) testing. Diabetes screening. This  is done by checking your blood sugar (glucose) after you have not eaten for a while (fasting). You may have this done every 1-3 years. Bone density scan. This is done to screen for osteoporosis. You may have this done starting at age 14. Mammogram. This may be done every 1-2 years. Talk to your health care provider about how often you should have regular mammograms. Talk with your health care provider about your test results, treatment options, and if necessary, the need for more tests. Vaccines  Your health care provider may recommend certain vaccines, such as: Influenza vaccine. This is recommended every year. Tetanus, diphtheria, and acellular pertussis (Tdap, Td) vaccine. You may need a Td booster every 10 years. Zoster vaccine. You may need this after age 52. Pneumococcal 13-valent conjugate (PCV13) vaccine. One dose is recommended after age 51. Pneumococcal polysaccharide (PPSV23) vaccine. One dose is recommended after age 27. Talk to your  health care provider about which screenings and vaccines you need and how often you need them. This information is not intended to replace advice given to you by your health care provider. Make sure you discuss any questions you have with your health care provider. Document Released: 02/06/2015 Document Revised: 09/30/2015 Document Reviewed: 11/11/2014 Elsevier Interactive Patient Education  2017 [employer].  Fall Prevention in the Home Falls can cause injuries. They can happen to people of all ages. There are many things you can do to make your home safe and to help prevent falls. What can I do on the outside of my home? Regularly fix the edges of walkways and driveways and fix any cracks. Remove anything that might make you trip as you walk through a door, such as a raised step or threshold. Trim any bushes or trees on the path to your home. Use bright outdoor lighting. Clear any walking paths of anything that might make someone trip, such as rocks or tools. Regularly check to see if handrails are loose or broken. Make sure that both sides of any steps have handrails. Any raised decks and porches should have guardrails on the edges. Have any leaves, snow, or ice cleared regularly. Use sand or salt on walking paths during winter. Clean up any spills in your garage right away. This includes oil or grease spills. What can I do in the bathroom? Use night lights. Install grab bars by the toilet and in the tub and shower. Do not use towel bars as grab bars. Use non-skid mats or decals in the tub or shower. If you need to sit down in the shower, use a plastic, non-slip stool. Keep the floor dry. Clean up any water that spills on the floor as soon as it happens. Remove soap buildup in the tub or shower regularly. Attach bath mats securely with double-sided non-slip rug tape. Do not have throw rugs and other things on the floor that can make you trip. What can I do in the bedroom? Use night  lights. Make sure that you have a light by your bed that is easy to reach. Do not use any sheets or blankets that are too big for your bed. They should not hang down onto the floor. Have a firm chair that has side arms. You can use this for support while you get dressed. Do not have throw rugs and other things on the floor that can make you trip. What can I do in the kitchen? Clean up any spills right away. Avoid walking on wet floors. Keep items that  you use a lot in easy-to-reach places. If you need to reach something above you, use a strong step stool that has a grab bar. Keep electrical cords out of the way. Do not use floor polish or wax that makes floors slippery. If you must use wax, use non-skid floor wax. Do not have throw rugs and other things on the floor that can make you trip. What can I do with my stairs? Do not leave any items on the stairs. Make sure that there are handrails on both sides of the stairs and use them. Fix handrails that are broken or loose. Make sure that handrails are as long as the stairways. Check any carpeting to make sure that it is firmly attached to the stairs. Fix any carpet that is loose or worn. Avoid having throw rugs at the top or bottom of the stairs. If you do have throw rugs, attach them to the floor with carpet tape. Make sure that you have a light switch at the top of the stairs and the bottom of the stairs. If you do not have them, ask someone to add them for you. What else can I do to help prevent falls? Wear shoes that: Do not have high heels. Have rubber bottoms. Are comfortable and fit you well. Are closed at the toe. Do not wear sandals. If you use a stepladder: Make sure that it is fully opened. Do not climb a closed stepladder. Make sure that both sides of the stepladder are locked into place. Ask someone to hold it for you, if possible. Clearly mark and make sure that you can see: Any grab bars or handrails. First and last  steps. Where the edge of each step is. Use tools that help you move around (mobility aids) if they are needed. These include: Canes. Walkers. Scooters. Crutches. Turn on the lights when you go into a dark area. Replace any light bulbs as soon as they burn out. Set up your furniture so you have a clear path. Avoid moving your furniture around. If any of your floors are uneven, fix them. If there are any pets around you, be aware of where they are. Review your medicines with your doctor. Some medicines can make you feel dizzy. This can increase your chance of falling. Ask your doctor what other things that you can do to help prevent falls. This information is not intended to replace advice given to you by your health care provider. Make sure you discuss any questions you have with your health care provider. Document Released: 11/06/2008 Document Revised: 06/18/2015 Document Reviewed: 02/14/2014 Elsevier Interactive Patient Education  2017 [employer].
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
## 2022-07-09 ENCOUNTER — Other Ambulatory Visit: Payer: Self-pay | Admitting: Family Medicine

## 2022-07-09 DIAGNOSIS — E1165 Type 2 diabetes mellitus with hyperglycemia: Secondary | ICD-10-CM

## 2022-07-09 DIAGNOSIS — Z8673 Personal history of transient ischemic attack (TIA), and cerebral infarction without residual deficits: Secondary | ICD-10-CM

## 2022-07-09 DIAGNOSIS — F339 Major depressive disorder, recurrent, unspecified: Secondary | ICD-10-CM

## 2022-07-09 DIAGNOSIS — G2581 Restless legs syndrome: Secondary | ICD-10-CM

## 2022-07-09 DIAGNOSIS — Z794 Long term (current) use of insulin: Secondary | ICD-10-CM

## 2022-07-09 DIAGNOSIS — I152 Hypertension secondary to endocrine disorders: Secondary | ICD-10-CM

## 2022-07-09 DIAGNOSIS — Z87898 Personal history of other specified conditions: Secondary | ICD-10-CM

## 2022-07-09 DIAGNOSIS — E1159 Type 2 diabetes mellitus with other circulatory complications: Secondary | ICD-10-CM

## 2022-07-11 ENCOUNTER — Encounter: Payer: Self-pay | Admitting: Family Medicine

## 2022-07-11 NOTE — Telephone Encounter (Signed)
LMTCB to schedule appt Letter mailed 

## 2022-07-11 NOTE — Telephone Encounter (Signed)
[facility]akes NTBS in Aug for 6 mos FU refill sent to pharmacy

## 2022-09-04 ENCOUNTER — Other Ambulatory Visit: Payer: Self-pay | Admitting: Family Medicine

## 2022-09-04 DIAGNOSIS — R251 Tremor, unspecified: Secondary | ICD-10-CM

## 2022-09-04 DIAGNOSIS — E1169 Type 2 diabetes mellitus with other specified complication: Secondary | ICD-10-CM

## 2022-09-04 DIAGNOSIS — E1165 Type 2 diabetes mellitus with hyperglycemia: Secondary | ICD-10-CM

## 2022-09-04 DIAGNOSIS — Z8673 Personal history of transient ischemic attack (TIA), and cerebral infarction without residual deficits: Secondary | ICD-10-CM

## 2022-09-05 ENCOUNTER — Other Ambulatory Visit: Payer: Self-pay | Admitting: Family Medicine

## 2022-09-05 DIAGNOSIS — R251 Tremor, unspecified: Secondary | ICD-10-CM

## 2022-09-07 ENCOUNTER — Other Ambulatory Visit: Payer: Self-pay

## 2022-09-07 DIAGNOSIS — Z1231 Encounter for screening mammogram for malignant neoplasm of breast: Secondary | ICD-10-CM

## 2022-09-14 ENCOUNTER — Encounter: Payer: Self-pay | Admitting: Family Medicine

## 2022-09-14 ENCOUNTER — Ambulatory Visit (INDEPENDENT_AMBULATORY_CARE_PROVIDER_SITE_OTHER): Payer: 59 | Admitting: Family Medicine

## 2022-09-14 VITALS — BP 118/62 | HR 64 | Temp 98.3°F | Ht 62.0 in | Wt 208.8 lb

## 2022-09-14 DIAGNOSIS — Z8673 Personal history of transient ischemic attack (TIA), and cerebral infarction without residual deficits: Secondary | ICD-10-CM | POA: Diagnosis not present

## 2022-09-14 DIAGNOSIS — Z87898 Personal history of other specified conditions: Secondary | ICD-10-CM | POA: Diagnosis not present

## 2022-09-14 DIAGNOSIS — E1159 Type 2 diabetes mellitus with other circulatory complications: Secondary | ICD-10-CM

## 2022-09-14 DIAGNOSIS — Z794 Long term (current) use of insulin: Secondary | ICD-10-CM

## 2022-09-14 DIAGNOSIS — F339 Major depressive disorder, recurrent, unspecified: Secondary | ICD-10-CM

## 2022-09-14 DIAGNOSIS — N184 Chronic kidney disease, stage 4 (severe): Secondary | ICD-10-CM | POA: Diagnosis not present

## 2022-09-14 DIAGNOSIS — E785 Hyperlipidemia, unspecified: Secondary | ICD-10-CM | POA: Diagnosis not present

## 2022-09-14 DIAGNOSIS — E1165 Type 2 diabetes mellitus with hyperglycemia: Secondary | ICD-10-CM | POA: Diagnosis not present

## 2022-09-14 DIAGNOSIS — G2581 Restless legs syndrome: Secondary | ICD-10-CM

## 2022-09-14 DIAGNOSIS — I152 Hypertension secondary to endocrine disorders: Secondary | ICD-10-CM | POA: Diagnosis not present

## 2022-09-14 DIAGNOSIS — E1122 Type 2 diabetes mellitus with diabetic chronic kidney disease: Secondary | ICD-10-CM | POA: Diagnosis not present

## 2022-09-14 DIAGNOSIS — K219 Gastro-esophageal reflux disease without esophagitis: Secondary | ICD-10-CM

## 2022-09-14 DIAGNOSIS — E1169 Type 2 diabetes mellitus with other specified complication: Secondary | ICD-10-CM

## 2022-09-14 DIAGNOSIS — R251 Tremor, unspecified: Secondary | ICD-10-CM

## 2022-09-14 LAB — BAYER DCA HB A1C WAIVED: HB A1C (BAYER DCA - WAIVED): 7.7 % — ABNORMAL HIGH (ref 4.8–5.6)

## 2022-09-14 MED ORDER — CITALOPRAM HYDROBROMIDE 40 MG PO TABS
40.0000 mg | ORAL_TABLET | Freq: Every day | ORAL | 1 refills | Status: DC
Start: 2022-09-14 — End: 2023-03-21

## 2022-09-14 MED ORDER — LISINOPRIL 2.5 MG PO TABS
2.5000 mg | ORAL_TABLET | Freq: Every day | ORAL | 1 refills | Status: DC
Start: 2022-09-14 — End: 2023-03-21

## 2022-09-14 MED ORDER — ATORVASTATIN CALCIUM 40 MG PO TABS
40.0000 mg | ORAL_TABLET | Freq: Every day | ORAL | 1 refills | Status: DC
Start: 2022-09-14 — End: 2023-03-22

## 2022-09-14 MED ORDER — TRESIBA FLEXTOUCH 200 UNIT/ML ~~LOC~~ SOPN
50.0000 [IU] | PEN_INJECTOR | Freq: Every day | SUBCUTANEOUS | 11 refills | Status: DC
Start: 2022-09-14 — End: 2023-07-12

## 2022-09-14 MED ORDER — PANTOPRAZOLE SODIUM 20 MG PO TBEC
20.0000 mg | DELAYED_RELEASE_TABLET | Freq: Every day | ORAL | 1 refills | Status: DC
Start: 2022-09-14 — End: 2023-03-21

## 2022-09-14 MED ORDER — PROPRANOLOL HCL 10 MG PO TABS
10.0000 mg | ORAL_TABLET | Freq: Two times a day (BID) | ORAL | 1 refills | Status: DC
Start: 2022-09-14 — End: 2023-02-20

## 2022-09-14 MED ORDER — PRAMIPEXOLE DIHYDROCHLORIDE 0.125 MG PO TABS
0.2500 mg | ORAL_TABLET | Freq: Every day | ORAL | 1 refills | Status: DC
Start: 2022-09-14 — End: 2023-03-21

## 2022-09-14 MED ORDER — PRAMIPEXOLE DIHYDROCHLORIDE 0.125 MG PO TABS
0.1250 mg | ORAL_TABLET | Freq: Every day | ORAL | 1 refills | Status: DC
Start: 2022-09-14 — End: 2022-09-14

## 2022-09-14 MED ORDER — DAPAGLIFLOZIN PROPANEDIOL 10 MG PO TABS
10.0000 mg | ORAL_TABLET | Freq: Every day | ORAL | 2 refills | Status: DC
Start: 2022-09-14 — End: 2023-05-01

## 2022-09-14 MED ORDER — LEVETIRACETAM 500 MG PO TABS
500.0000 mg | ORAL_TABLET | Freq: Two times a day (BID) | ORAL | 1 refills | Status: DC
Start: 2022-09-14 — End: 2023-03-21

## 2022-09-14 NOTE — Patient Instructions (Addendum)
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
## 2022-09-15 LAB — CMP14+EGFR
ALT: 12 IU/L (ref 0–32)
AST: 11 IU/L (ref 0–40)
Albumin: 4.2 g/dL (ref 3.9–4.9)
Alkaline Phosphatase: 110 IU/L (ref 44–121)
BUN/Creatinine Ratio: 13 (ref 12–28)
BUN: 28 mg/dL — ABNORMAL HIGH (ref 8–27)
Bilirubin Total: 0.5 mg/dL (ref 0.0–1.2)
CO2: 22 mmol/L (ref 20–29)
Calcium: 9.3 mg/dL (ref 8.7–10.3)
Chloride: 98 mmol/L (ref 96–106)
Creatinine, Ser: 2.16 mg/dL — ABNORMAL HIGH (ref 0.57–1.00)
Globulin, Total: 2.4 g/dL (ref 1.5–4.5)
Glucose: 239 mg/dL — ABNORMAL HIGH (ref 70–99)
Potassium: 4.7 mmol/L (ref 3.5–5.2)
Sodium: 137 mmol/L (ref 134–144)
Total Protein: 6.6 g/dL (ref 6.0–8.5)
eGFR: 24 mL/min/{1.73_m2} — ABNORMAL LOW (ref >59)

## 2022-09-15 NOTE — Addendum Note (Signed)
Addended by: Sonny Masters on: 09/15/2022 07:47 AM   Modules accepted: Orders

## 2022-11-08 ENCOUNTER — Encounter: Payer: Self-pay | Admitting: Family Medicine

## 2022-11-16 ENCOUNTER — Other Ambulatory Visit: Payer: Self-pay | Admitting: Family Medicine

## 2022-11-16 DIAGNOSIS — I152 Hypertension secondary to endocrine disorders: Secondary | ICD-10-CM

## 2022-11-16 DIAGNOSIS — Z794 Long term (current) use of insulin: Secondary | ICD-10-CM

## 2022-12-01 ENCOUNTER — Encounter: Payer: Self-pay | Admitting: Family Medicine

## 2022-12-07 ENCOUNTER — Other Ambulatory Visit: Payer: Self-pay | Admitting: Family Medicine

## 2022-12-07 DIAGNOSIS — I152 Hypertension secondary to endocrine disorders: Secondary | ICD-10-CM

## 2022-12-12 ENCOUNTER — Ambulatory Visit: Payer: 59

## 2022-12-13 ENCOUNTER — Ambulatory Visit: Payer: 59

## 2022-12-20 DIAGNOSIS — I129 Hypertensive chronic kidney disease with stage 1 through stage 4 chronic kidney disease, or unspecified chronic kidney disease: Secondary | ICD-10-CM | POA: Diagnosis not present

## 2022-12-20 DIAGNOSIS — I5032 Chronic diastolic (congestive) heart failure: Secondary | ICD-10-CM | POA: Diagnosis not present

## 2022-12-20 DIAGNOSIS — N184 Chronic kidney disease, stage 4 (severe): Secondary | ICD-10-CM | POA: Diagnosis not present

## 2022-12-20 DIAGNOSIS — E1122 Type 2 diabetes mellitus with diabetic chronic kidney disease: Secondary | ICD-10-CM | POA: Diagnosis not present

## 2022-12-30 ENCOUNTER — Telehealth: Payer: Self-pay | Admitting: Family Medicine

## 2022-12-30 NOTE — Telephone Encounter (Signed)
[employer] is calling in regarding the prescription form for the glucose monitor need a current chart note with diagnose and treatment plan

## 2023-01-02 NOTE — Telephone Encounter (Signed)
TC back to [employer], I do not have that pt is w/ [facility] or that she has a Dexcom. Pt will have to call us to let us know that she wants to use this company as we do not usually use this one.

## 2023-01-03 ENCOUNTER — Telehealth: Payer: Self-pay | Admitting: Family Medicine

## 2023-02-11 ENCOUNTER — Other Ambulatory Visit: Payer: Self-pay | Admitting: Family Medicine

## 2023-02-11 DIAGNOSIS — H6993 Unspecified Eustachian tube disorder, bilateral: Secondary | ICD-10-CM

## 2023-02-19 ENCOUNTER — Other Ambulatory Visit: Payer: Self-pay | Admitting: Family Medicine

## 2023-02-19 DIAGNOSIS — R251 Tremor, unspecified: Secondary | ICD-10-CM

## 2023-03-22 ENCOUNTER — Encounter: Payer: Self-pay | Admitting: Family Medicine

## 2023-03-22 ENCOUNTER — Ambulatory Visit (INDEPENDENT_AMBULATORY_CARE_PROVIDER_SITE_OTHER): Payer: 59 | Admitting: Family Medicine

## 2023-03-22 ENCOUNTER — Other Ambulatory Visit: Payer: Self-pay | Admitting: Family Medicine

## 2023-03-22 VITALS — BP 131/74 | HR 57 | Temp 96.0°F | Ht 62.0 in | Wt 214.8 lb

## 2023-03-22 DIAGNOSIS — Z1231 Encounter for screening mammogram for malignant neoplasm of breast: Secondary | ICD-10-CM

## 2023-03-22 DIAGNOSIS — E785 Hyperlipidemia, unspecified: Secondary | ICD-10-CM

## 2023-03-22 DIAGNOSIS — E538 Deficiency of other specified B group vitamins: Secondary | ICD-10-CM

## 2023-03-22 DIAGNOSIS — E1169 Type 2 diabetes mellitus with other specified complication: Secondary | ICD-10-CM

## 2023-03-22 DIAGNOSIS — Z8673 Personal history of transient ischemic attack (TIA), and cerebral infarction without residual deficits: Secondary | ICD-10-CM

## 2023-03-22 DIAGNOSIS — G2581 Restless legs syndrome: Secondary | ICD-10-CM

## 2023-03-22 DIAGNOSIS — K219 Gastro-esophageal reflux disease without esophagitis: Secondary | ICD-10-CM | POA: Diagnosis not present

## 2023-03-22 DIAGNOSIS — E1122 Type 2 diabetes mellitus with diabetic chronic kidney disease: Secondary | ICD-10-CM

## 2023-03-22 DIAGNOSIS — F339 Major depressive disorder, recurrent, unspecified: Secondary | ICD-10-CM

## 2023-03-22 DIAGNOSIS — Z0001 Encounter for general adult medical examination with abnormal findings: Secondary | ICD-10-CM

## 2023-03-22 DIAGNOSIS — I69320 Aphasia following cerebral infarction: Secondary | ICD-10-CM

## 2023-03-22 DIAGNOSIS — Z794 Long term (current) use of insulin: Secondary | ICD-10-CM

## 2023-03-22 DIAGNOSIS — R251 Tremor, unspecified: Secondary | ICD-10-CM

## 2023-03-22 DIAGNOSIS — N184 Chronic kidney disease, stage 4 (severe): Secondary | ICD-10-CM

## 2023-03-22 DIAGNOSIS — I152 Hypertension secondary to endocrine disorders: Secondary | ICD-10-CM | POA: Diagnosis not present

## 2023-03-22 DIAGNOSIS — E119 Type 2 diabetes mellitus without complications: Secondary | ICD-10-CM | POA: Insufficient documentation

## 2023-03-22 DIAGNOSIS — Z87898 Personal history of other specified conditions: Secondary | ICD-10-CM

## 2023-03-22 DIAGNOSIS — Z13820 Encounter for screening for osteoporosis: Secondary | ICD-10-CM

## 2023-03-22 DIAGNOSIS — Z0000 Encounter for general adult medical examination without abnormal findings: Secondary | ICD-10-CM

## 2023-03-22 DIAGNOSIS — E1159 Type 2 diabetes mellitus with other circulatory complications: Secondary | ICD-10-CM | POA: Diagnosis not present

## 2023-03-22 LAB — BAYER DCA HB A1C WAIVED: HB A1C (BAYER DCA - WAIVED): 7.8 % — ABNORMAL HIGH (ref 4.8–5.6)

## 2023-03-22 LAB — LIPID PANEL

## 2023-03-22 MED ORDER — CITALOPRAM HYDROBROMIDE 40 MG PO TABS: 40.0000 mg | ORAL_TABLET | Freq: Every day | ORAL | 1 refills | Status: DC

## 2023-03-22 MED ORDER — LISINOPRIL 2.5 MG PO TABS: 2.5000 mg | ORAL_TABLET | Freq: Every day | ORAL | 1 refills | Status: DC

## 2023-03-22 MED ORDER — LEVETIRACETAM 500 MG PO TABS: 500.0000 mg | ORAL_TABLET | Freq: Two times a day (BID) | ORAL | 1 refills | Status: DC

## 2023-03-22 MED ORDER — PRAMIPEXOLE DIHYDROCHLORIDE 0.125 MG PO TABS: 0.2500 mg | ORAL_TABLET | Freq: Every day | ORAL | 1 refills | Status: DC

## 2023-03-22 MED ORDER — PROPRANOLOL HCL 10 MG PO TABS: 10.0000 mg | ORAL_TABLET | Freq: Two times a day (BID) | ORAL | 1 refills | Status: DC

## 2023-03-22 MED ORDER — PANTOPRAZOLE SODIUM 20 MG PO TBEC: 20.0000 mg | DELAYED_RELEASE_TABLET | Freq: Every day | ORAL | 1 refills | Status: DC

## 2023-03-22 MED ORDER — ATORVASTATIN CALCIUM 40 MG PO TABS
40.0000 mg | ORAL_TABLET | Freq: Every day | ORAL | 1 refills | Status: DC
Start: 2023-03-22 — End: 2023-03-22
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
## 2023-03-22 NOTE — Patient Instructions (Signed)

## 2023-03-23 LAB — MICROALBUMIN / CREATININE URINE RATIO
Creatinine, Urine: 24.4 mg/dL
Microalb/Creat Ratio: 69 mg/g{creat} — ABNORMAL HIGH (ref 0–29)
Microalbumin, Urine: 16.8 ug/mL

## 2023-03-23 LAB — CMP14+EGFR
ALT: 14 IU/L (ref 0–32)
AST: 11 [IU]/L (ref 0–40)
Albumin: 4.3 g/dL (ref 3.9–4.9)
Alkaline Phosphatase: 123 IU/L — ABNORMAL HIGH (ref 44–121)
BUN/Creatinine Ratio: 15 (ref 12–28)
BUN: 29 mg/dL — ABNORMAL HIGH (ref 8–27)
Bilirubin Total: 0.5 mg/dL (ref 0.0–1.2)
CO2: 21 mmol/L (ref 20–29)
Calcium: 9.4 mg/dL (ref 8.7–10.3)
Chloride: 99 mmol/L (ref 96–106)
Creatinine, Ser: 1.94 mg/dL — ABNORMAL HIGH (ref 0.57–1.00)
Globulin, Total: 2.4 g/dL (ref 1.5–4.5)
Glucose: 152 mg/dL — ABNORMAL HIGH (ref 70–99)
Potassium: 4.2 mmol/L (ref 3.5–5.2)
Sodium: 136 mmol/L (ref 134–144)
Total Protein: 6.7 g/dL (ref 6.0–8.5)
eGFR: 28 mL/min/{1.73_m2} — ABNORMAL LOW (ref >59)

## 2023-03-23 LAB — CBC WITH DIFFERENTIAL/PLATELET
Basophils Absolute: 0.1 10*3/uL (ref 0.0–0.2)
Basos: 1 %
EOS (ABSOLUTE): 0.2 10*3/uL (ref 0.0–0.4)
Eos: 3 %
Hematocrit: 38.8 % (ref 34.0–46.6)
Hemoglobin: 12.7 g/dL (ref 11.1–15.9)
Immature Grans (Abs): 0 10*3/uL (ref 0.0–0.1)
Immature Granulocytes: 0 %
Lymphocytes Absolute: 1.6 10*3/uL (ref 0.7–3.1)
Lymphs: 21 %
MCH: 29.3 pg (ref 26.6–33.0)
MCHC: 32.7 g/dL (ref 31.5–35.7)
MCV: 90 fL (ref 79–97)
Monocytes Absolute: 0.5 10*3/uL (ref 0.1–0.9)
Monocytes: 6 %
Neutrophils Absolute: 5.3 10*3/uL (ref 1.4–7.0)
Neutrophils: 69 %
Platelets: 296 10*3/uL (ref 150–450)
RBC: 4.33 x10E6/uL (ref 3.77–5.28)
RDW: 12.7 % (ref 11.7–15.4)
WBC: 7.6 10*3/uL (ref 3.4–10.8)

## 2023-03-23 LAB — THYROID PANEL WITH TSH
Free Thyroxine Index: 2 (ref 1.2–4.9)
T3 Uptake Ratio: 28 % (ref 24–39)
T4, Total: 7.2 ug/dL (ref 4.5–12.0)
TSH: 3.63 u[IU]/mL (ref 0.450–4.500)

## 2023-03-23 LAB — LIPID PANEL
Cholesterol, Total: 124 mg/dL (ref 100–199)
HDL: 43 mg/dL (ref >39)
LDL CALC COMMENT:: 2.9 ratio (ref 0.0–4.4)
LDL Chol Calc (NIH): 58 mg/dL (ref 0–99)
Triglycerides: 128 mg/dL (ref 0–149)
VLDL Cholesterol Cal: 23 mg/dL (ref 5–40)

## 2023-03-23 LAB — VITAMIN B12: Vitamin B-12: 1834 pg/mL — ABNORMAL HIGH (ref 232–1245)

## 2023-03-27 ENCOUNTER — Ambulatory Visit (INDEPENDENT_AMBULATORY_CARE_PROVIDER_SITE_OTHER): Payer: 59

## 2023-03-27 DIAGNOSIS — Z780 Asymptomatic menopausal state: Secondary | ICD-10-CM

## 2023-03-27 DIAGNOSIS — Z0000 Encounter for general adult medical examination without abnormal findings: Secondary | ICD-10-CM

## 2023-03-29 DIAGNOSIS — Z780 Asymptomatic menopausal state: Secondary | ICD-10-CM | POA: Diagnosis not present

## 2023-03-29 DIAGNOSIS — M85852 Other specified disorders of bone density and structure, left thigh: Secondary | ICD-10-CM | POA: Diagnosis not present

## 2023-03-30 ENCOUNTER — Encounter: Payer: Self-pay | Admitting: Family Medicine

## 2023-03-31 ENCOUNTER — Encounter: Payer: Self-pay | Admitting: Neurology

## 2023-03-31 DIAGNOSIS — I5032 Chronic diastolic (congestive) heart failure: Secondary | ICD-10-CM | POA: Diagnosis not present

## 2023-03-31 DIAGNOSIS — N184 Chronic kidney disease, stage 4 (severe): Secondary | ICD-10-CM | POA: Diagnosis not present

## 2023-03-31 DIAGNOSIS — I129 Hypertensive chronic kidney disease with stage 1 through stage 4 chronic kidney disease, or unspecified chronic kidney disease: Secondary | ICD-10-CM | POA: Diagnosis not present

## 2023-03-31 DIAGNOSIS — E1122 Type 2 diabetes mellitus with diabetic chronic kidney disease: Secondary | ICD-10-CM | POA: Diagnosis not present

## 2023-04-16 ENCOUNTER — Other Ambulatory Visit: Payer: Self-pay | Admitting: Family Medicine

## 2023-04-16 DIAGNOSIS — E119 Type 2 diabetes mellitus without complications: Secondary | ICD-10-CM

## 2023-04-16 DIAGNOSIS — I152 Hypertension secondary to endocrine disorders: Secondary | ICD-10-CM

## 2023-04-16 DIAGNOSIS — E1159 Type 2 diabetes mellitus with other circulatory complications: Secondary | ICD-10-CM

## 2023-04-29 ENCOUNTER — Other Ambulatory Visit: Payer: Self-pay | Admitting: Family Medicine

## 2023-04-29 DIAGNOSIS — N184 Chronic kidney disease, stage 4 (severe): Secondary | ICD-10-CM

## 2023-04-29 DIAGNOSIS — Z794 Long term (current) use of insulin: Secondary | ICD-10-CM

## 2023-05-15 ENCOUNTER — Ambulatory Visit

## 2023-05-18 ENCOUNTER — Ambulatory Visit

## 2023-05-19 ENCOUNTER — Encounter: Payer: Self-pay | Admitting: Neurology

## 2023-05-25 IMAGING — CT CT HEAD W/O CM
3 series · 15 of 47 positions shown, 18 images · non-contrast
Comparison: Brain MRI and intracranial MRA 10/08/2018. Head CT
10/07/2018.

CLINICAL DATA: 65-year-old female with left side weakness and
numbness for 2 weeks.

EXAM:
CT HEAD WITHOUT CONTRAST
TECHNIQUE: Contiguous axial images were obtained from the base of the skull
through the vertex without intravenous contrast.

[Series 2: head w o · axial · 0.40mm/px · z∈[+21,+146]mm · 9 of 31 slices shown, 12 images]
[im 3/31  brain]
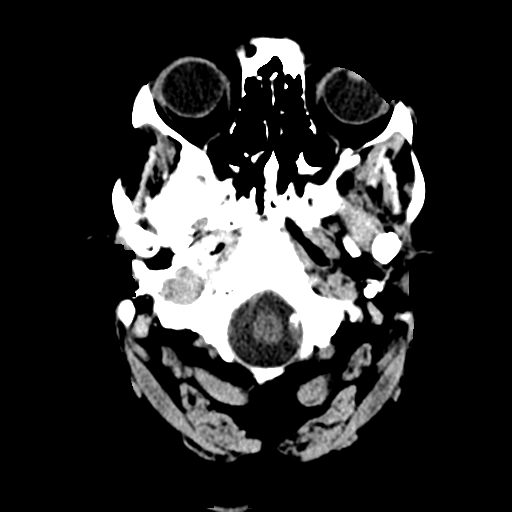
[im 3/31  bone]
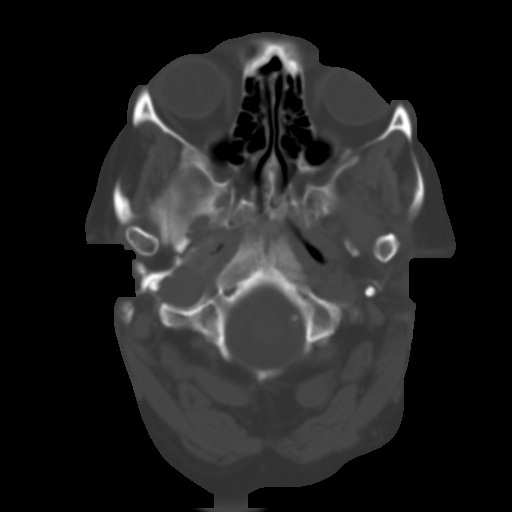
[im 6/31  brain]
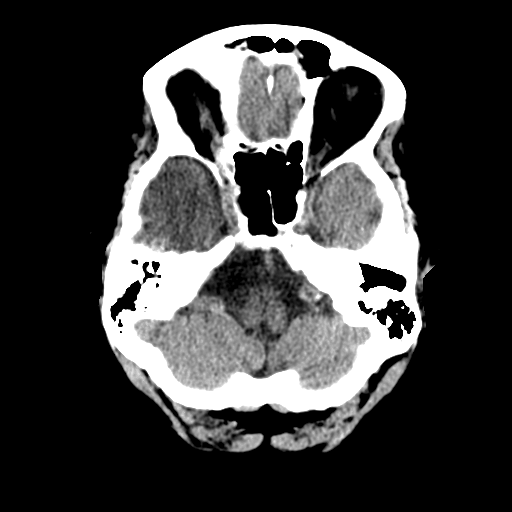
[im 9/31  brain]
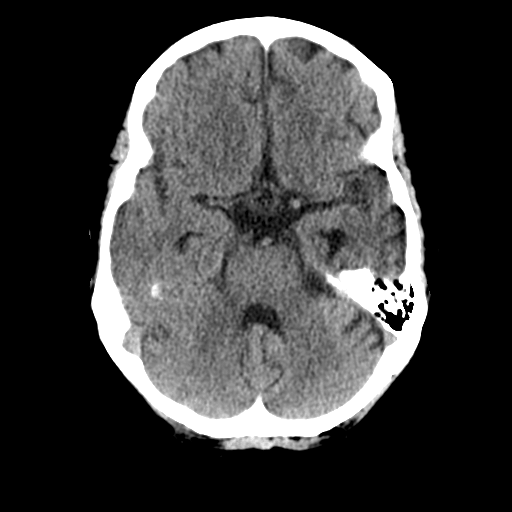
[im 12/31  brain]
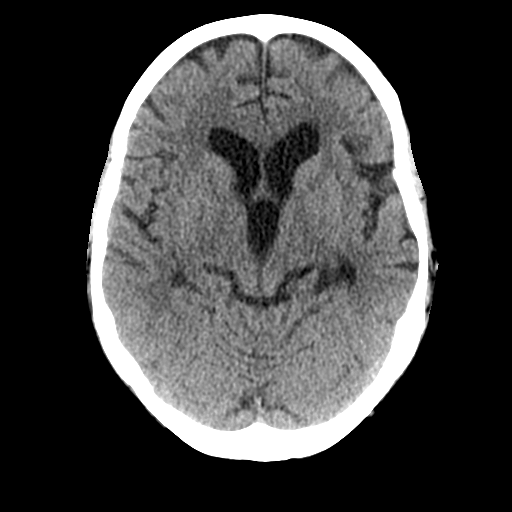
[im 16/31  brain]
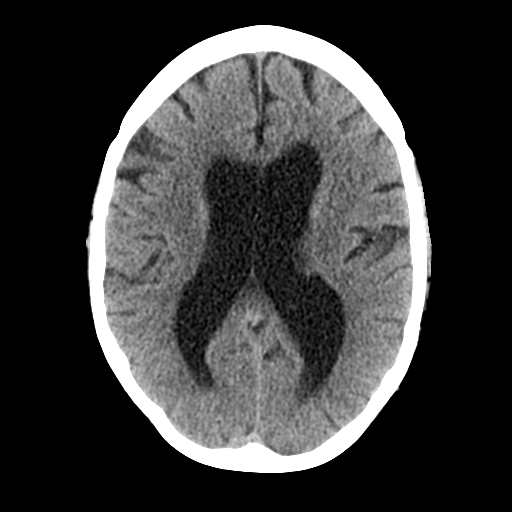
[im 16/31  bone]
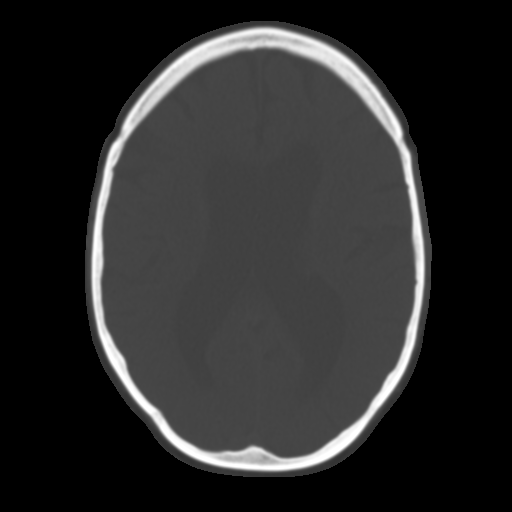
[im 19/31  brain]
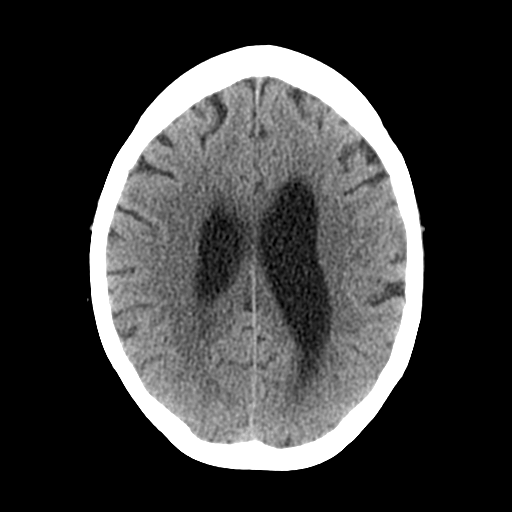
[im 22/31  brain]
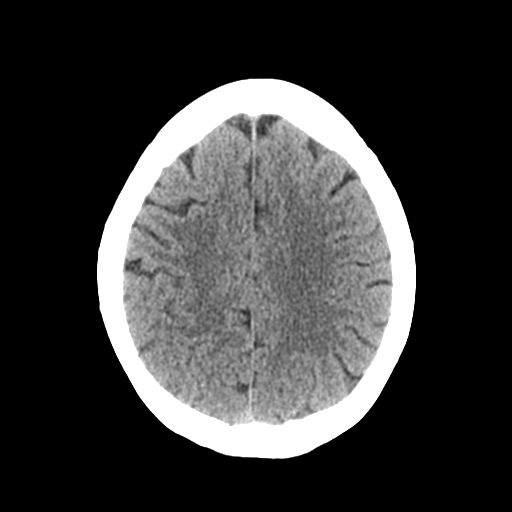
[im 25/31  brain]
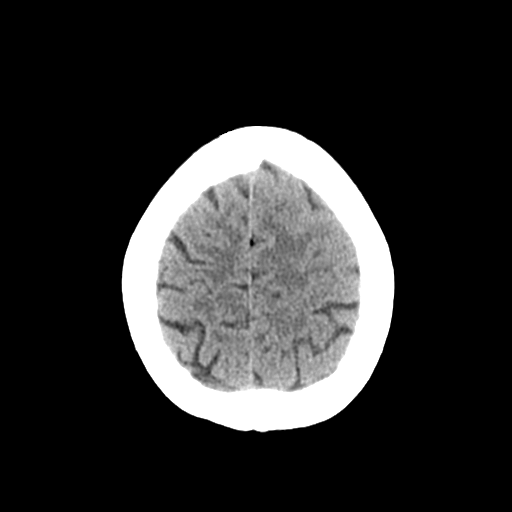
[im 28/31  brain]
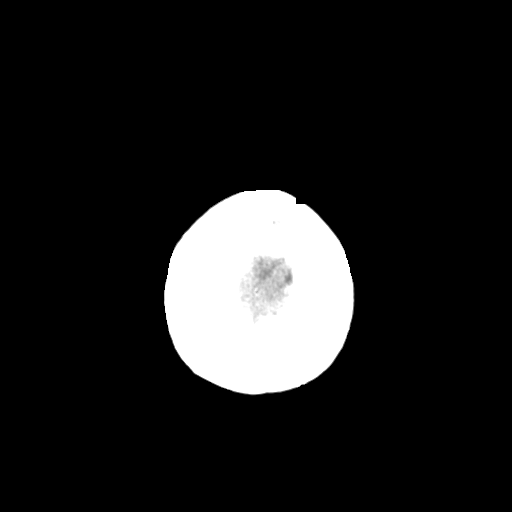
[im 28/31  bone]
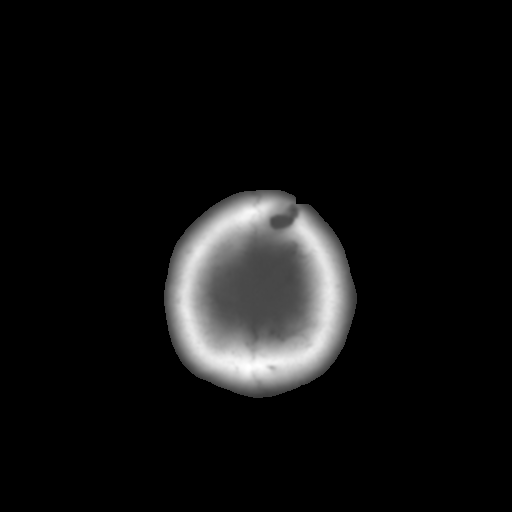

[Series 4: coronal soft · coronal · 0.34mm/px · 3 of 67 slices shown]
[im 23/67  brain]
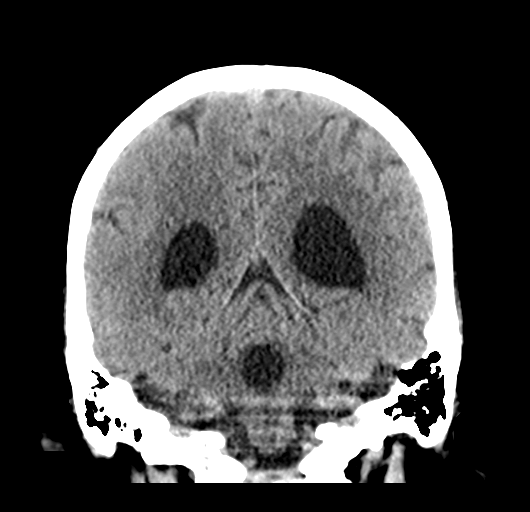
[im 30/67  brain]
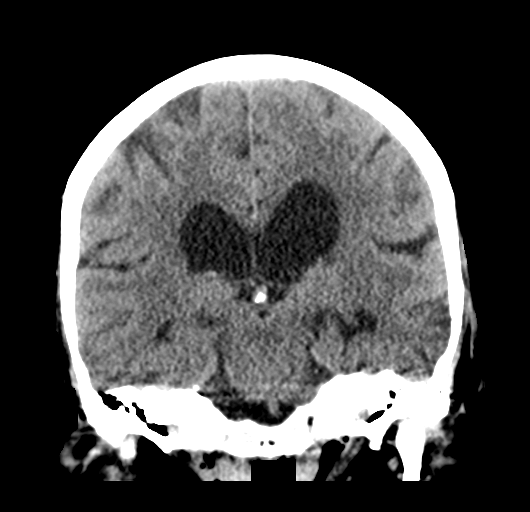
[im 37/67  brain]
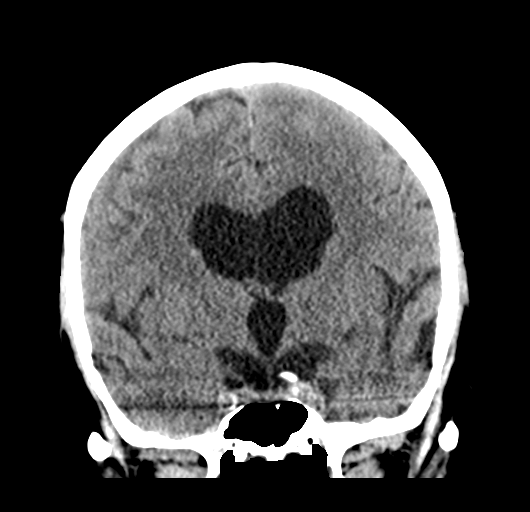

[Series 5: sagittal soft · sagittal · 0.33mm/px · 3 of 67 slices shown]
[im 23/67  brain]
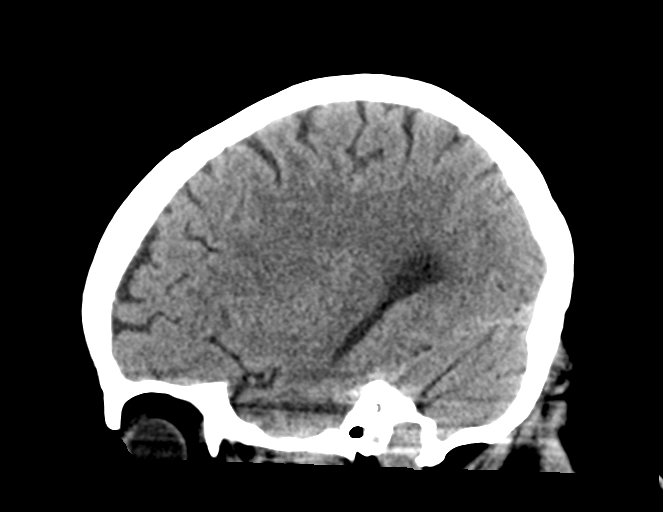
[im 34/67  brain]
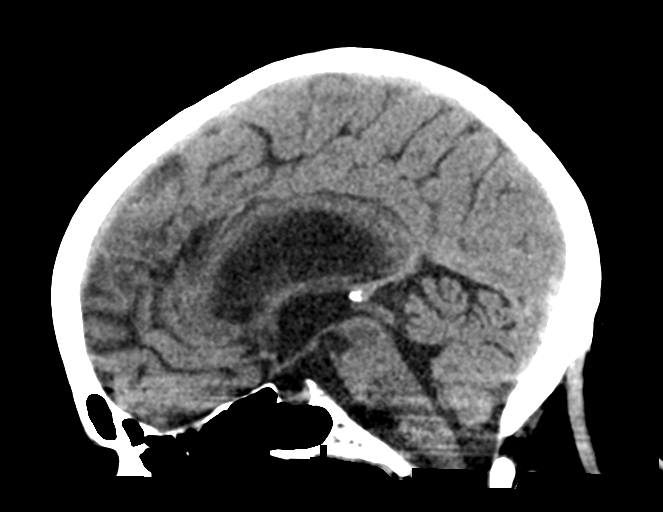
[im 45/67  brain]
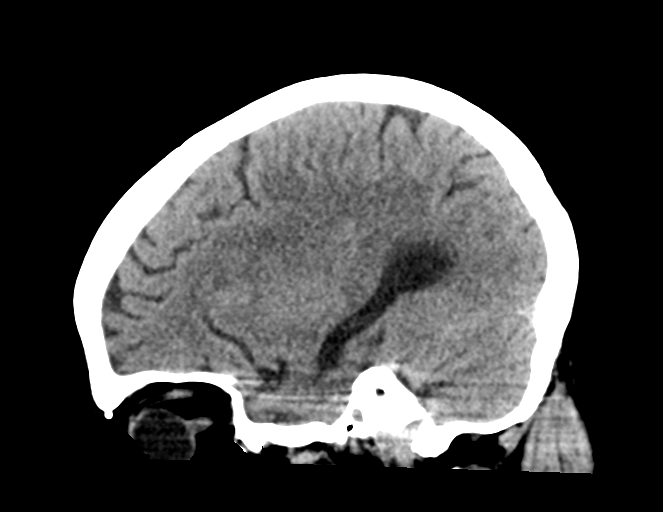

[15 of 47 positions shown; findings below may reference images not displayed]

FINDINGS: Brain: Stable mild ventricular prominence. No midline shift, mass
effect, evidence of mass lesion, intracranial hemorrhage or evidence
of cortically based acute infarction. Subtle white matter
hypodensity at the anterior corona radiata corresponding to the 8383
lacunar infarct there. Elsewhere gray-white matter differentiation
appears stable and within normal limits.

Vascular: Mild Calcified atherosclerosis at the skull base. No
suspicious intracranial vascular hyperdensity.

Skull: Stable, negative.

Sinuses/Orbits: Visualized paranasal sinuses and mastoids are stable
and well aerated.

Other: Visualized orbits and scalp soft tissues are within normal
limits.
IMPRESSION: 1. No acute intracranial abnormality.
2. Mild chronic small vessel disease in the left corona radiata is
stable since [DATE].

## 2023-06-02 ENCOUNTER — Ambulatory Visit (INDEPENDENT_AMBULATORY_CARE_PROVIDER_SITE_OTHER): Admitting: Neurology

## 2023-06-02 ENCOUNTER — Encounter: Payer: Self-pay | Admitting: Neurology

## 2023-06-02 VITALS — BP 129/65 | HR 62 | Ht 60.0 in | Wt 218.2 lb

## 2023-06-02 DIAGNOSIS — R413 Other amnesia: Secondary | ICD-10-CM

## 2023-06-02 DIAGNOSIS — G40209 Localization-related (focal) (partial) symptomatic epilepsy and epileptic syndromes with complex partial seizures, not intractable, without status epilepticus: Secondary | ICD-10-CM

## 2023-06-02 DIAGNOSIS — Z8673 Personal history of transient ischemic attack (TIA), and cerebral infarction without residual deficits: Secondary | ICD-10-CM | POA: Diagnosis not present

## 2023-06-02 NOTE — Patient Instructions (Signed)
 Good to meet you.  Schedule MRI brain with and without contrast  2. Schedule 1-hour EEG. If normal, we will do a 2-day home EEG  3. Follow-up after tests, call for any changes   Seizure Precautions: 1. If medication has been prescribed for you to prevent seizures, take it exactly as directed.  Do not stop taking the medicine without talking to your doctor first, even if you have not had a seizure in a long time.   2. Avoid activities in which a seizure would cause danger to yourself or to others.  Don't operate dangerous machinery, swim alone, or climb in high or dangerous places, such as on ladders, roofs, or girders.  Do not drive unless your doctor says you may.  3. If you have any warning that you may have a seizure, lay down in a safe place where you can't hurt yourself.    4.  No driving for 6 months from last seizure, as per [place]  state law.   Please refer to the following link on the Epilepsy Foundation of America's website for more information: http://www.epilepsyfoundation.org/answerplace/Social/driving/drivingu.cfm   5.  Maintain good sleep hygiene. Avoid alcohol.  6.  Contact your doctor if you have any problems that may be related to the medicine you are taking.  7.  Call 911 and bring the patient back to the ED if:        A.  The seizure lasts longer than 5 minutes.       B.  The patient doesn't awaken shortly after the seizure  C.  The patient has new problems such as difficulty seeing, speaking or moving  D.  The patient was injured during the seizure  E.  The patient has a temperature over 102 F (39C)  F.  The patient vomited and now is having trouble breathing
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
## 2023-06-08 ENCOUNTER — Ambulatory Visit (INDEPENDENT_AMBULATORY_CARE_PROVIDER_SITE_OTHER): Admitting: Neurology

## 2023-06-08 DIAGNOSIS — R413 Other amnesia: Secondary | ICD-10-CM

## 2023-06-08 DIAGNOSIS — G40209 Localization-related (focal) (partial) symptomatic epilepsy and epileptic syndromes with complex partial seizures, not intractable, without status epilepticus: Secondary | ICD-10-CM | POA: Diagnosis not present

## 2023-06-08 DIAGNOSIS — Z8673 Personal history of transient ischemic attack (TIA), and cerebral infarction without residual deficits: Secondary | ICD-10-CM

## 2023-06-09 ENCOUNTER — Telehealth: Payer: Self-pay

## 2023-06-09 ENCOUNTER — Ambulatory Visit: Admitting: Neurology

## 2023-06-09 NOTE — Telephone Encounter (Signed)
 Copied from CRM [id number]. Topic: Clinical - Lab/Test Results >> Jun 09, 2023  2:06 PM Sophia H wrote: Reason for CRM:  [name] - [facility] lab, ph # [phone]  Lab records form regarding UTI was faxed over, just needed to check status of forms. Do not show any forms in chart, confirmed fax # [phone]. Please advise if received, states was sent over yesterday 05/15 and again this morning 05/16.

## 2023-06-12 ENCOUNTER — Telehealth: Payer: Self-pay

## 2023-06-12 NOTE — Telephone Encounter (Signed)
 Copied from CRM [id number]. Topic: General - Other >> Jun 12, 2023 10:23 AM Shelby Dessert H wrote: Reason for CRM: Lab called and wanted the status on the providers signature, Can you please call [name] back at [phone] to give her an update on this matter.

## 2023-06-12 NOTE — Telephone Encounter (Signed)
 Multiple calls regarding this, please see previous telephone encounter. Will close encounter.

## 2023-06-14 ENCOUNTER — Telehealth: Payer: Self-pay

## 2023-06-14 NOTE — Telephone Encounter (Signed)
 Returned call to [name] and [name] will fax over lab request

## 2023-06-14 NOTE — Telephone Encounter (Signed)
 Copied from CRM [id number]. Topic: Clinical - Request for Lab/Test Order >> Jun 14, 2023 11:51 AM Star East wrote: Reason for CRM: Tracy Farrell with [facility] called and wanted the status on the providers signature, Can you please call Tracy Farrell back at [phone] to give her an update on this matter. Need signature faxed back asap.

## 2023-06-14 NOTE — Procedures (Signed)
 ELECTROENCEPHALOGRAM REPORT  Date of Study: 06/08/2023  Patient's Name: Tracy Farrell MRN: [id number] Date of Birth: [birth year]  Referring Provider: Dr. Rayfield Cairo  Clinical History: This is a 68 year old woman with episodes where she goes blank with word-finding difficulties. EEG for classification.  Medications: Keppra , Citalopram , Pramipexole   Technical Summary: A multichannel digital 1-hour EEG recording measured by the international 10-20 system with electrodes applied with paste and impedances below 5000 ohms performed in our laboratory with EKG monitoring in an awake and asleep patient.  Hyperventilation was not performed. Photic stimulation was performed.  The digital EEG was referentially recorded, reformatted, and digitally filtered in a variety of bipolar and referential montages for optimal display.    Description: The patient is awake and asleep during the recording.  During maximal wakefulness, there is a symmetric, medium voltage 10 Hz posterior dominant rhythm that attenuates with eye opening.  There if occasional focal 2-3 Hz delta slowing over the left temporal region. During drowsiness and sleep, there is an increase in theta slowing of the background.  Vertex waves and symmetric sleep spindles were seen. Photic stimulation did not elicit any abnormalities.  There were no epileptiform discharges or electrographic seizures seen.    EKG lead was unremarkable.  Impression: This 1-hour awake and asleep EEG is abnormal due to occasional focal slowing over the left temporal region.  Clinical Correlation of the above findings indicates focal cerebral dysfunction over the left temporal region suggestive of underlying structural or physiologic abnormality. The absence of epileptiform discharges does not exclude a clinical diagnosis of epilepsy. Clinical correlation is advised.   Rayfield Cairo, M.D.

## 2023-06-15 NOTE — Telephone Encounter (Signed)
 Multiple calls regarding this, will close encounter.

## 2023-06-16 ENCOUNTER — Telehealth: Payer: Self-pay | Admitting: Neurology

## 2023-06-16 ENCOUNTER — Telehealth: Payer: Self-pay

## 2023-06-16 NOTE — Telephone Encounter (Signed)
 Miracle from [facility] said that patient needed to have someone call her to go over the MRI results again  with her. She did not understand why she had the MRI or the results

## 2023-06-16 NOTE — Telephone Encounter (Signed)
Pt called no answer left a voice mail to call back  

## 2023-06-16 NOTE — Telephone Encounter (Signed)
 Spoke with patient and she states she has an appointment coming up and we will discuss it then.

## 2023-06-16 NOTE — Telephone Encounter (Signed)
 Copied from CRM [id number]. Topic: Clinical - Lab/Test Results >> Jun 16, 2023 10:17 AM Tiffany H wrote: Reason for CRM: [facility] RN case manager called to advise that patient was looking for follow up on recent brain scan. Advised of MR ordered but not completed and EEG completed by Neurology. Patient has memory loss and may need to be educated about how to manage confusion/stress. Please reach out to patient's caretaker.

## 2023-06-20 NOTE — Telephone Encounter (Signed)
Pt called no answer left a voice mail to call back  

## 2023-06-21 ENCOUNTER — Ambulatory Visit: Payer: Self-pay | Admitting: Neurology

## 2023-06-21 NOTE — Telephone Encounter (Signed)
 Pls let her know the EEG did not show any ongoing seizure activity. It showed similar changes to her EEG in 2020 due to the old stroke. Proceed with brain MRI. We discussed doing a 2-day home EEG if the office EEG is okay so she can push the button when she is feeling the blank episodes, pls order 48-hour EEG, thanks

## 2023-06-22 ENCOUNTER — Other Ambulatory Visit: Payer: Self-pay

## 2023-06-22 DIAGNOSIS — G40209 Localization-related (focal) (partial) symptomatic epilepsy and epileptic syndromes with complex partial seizures, not intractable, without status epilepticus: Secondary | ICD-10-CM

## 2023-06-23 ENCOUNTER — Ambulatory Visit: Payer: 59 | Admitting: Family Medicine

## 2023-06-23 ENCOUNTER — Ambulatory Visit: Payer: Self-pay | Admitting: Family Medicine

## 2023-06-23 ENCOUNTER — Encounter: Payer: Self-pay | Admitting: Family Medicine

## 2023-06-23 VITALS — BP 139/72 | HR 71 | Temp 96.7°F | Ht 61.0 in | Wt 219.8 lb

## 2023-06-23 DIAGNOSIS — Z6841 Body Mass Index (BMI) 40.0 and over, adult: Secondary | ICD-10-CM

## 2023-06-23 DIAGNOSIS — E785 Hyperlipidemia, unspecified: Secondary | ICD-10-CM

## 2023-06-23 DIAGNOSIS — Z794 Long term (current) use of insulin: Secondary | ICD-10-CM | POA: Diagnosis not present

## 2023-06-23 DIAGNOSIS — E1122 Type 2 diabetes mellitus with diabetic chronic kidney disease: Secondary | ICD-10-CM | POA: Diagnosis not present

## 2023-06-23 DIAGNOSIS — G2581 Restless legs syndrome: Secondary | ICD-10-CM

## 2023-06-23 DIAGNOSIS — E538 Deficiency of other specified B group vitamins: Secondary | ICD-10-CM

## 2023-06-23 DIAGNOSIS — E1159 Type 2 diabetes mellitus with other circulatory complications: Secondary | ICD-10-CM

## 2023-06-23 DIAGNOSIS — N184 Chronic kidney disease, stage 4 (severe): Secondary | ICD-10-CM

## 2023-06-23 DIAGNOSIS — E1169 Type 2 diabetes mellitus with other specified complication: Secondary | ICD-10-CM | POA: Diagnosis not present

## 2023-06-23 DIAGNOSIS — I152 Hypertension secondary to endocrine disorders: Secondary | ICD-10-CM | POA: Diagnosis not present

## 2023-06-23 LAB — BAYER DCA HB A1C WAIVED: HB A1C (BAYER DCA - WAIVED): 8.7 % — ABNORMAL HIGH (ref 4.8–5.6)

## 2023-06-23 MED ORDER — LISINOPRIL 5 MG PO TABS: 5.0000 mg | ORAL_TABLET | Freq: Every day | ORAL | 3 refills | Status: DC

## 2023-06-23 MED ORDER — PRAMIPEXOLE DIHYDROCHLORIDE 0.5 MG PO TABS: 0.5000 mg | ORAL_TABLET | Freq: Every day | ORAL | 0 refills | Status: DC

## 2023-06-23 NOTE — Patient Instructions (Addendum)
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
## 2023-06-24 LAB — CMP14+EGFR
ALT: 15 IU/L (ref 0–32)
AST: 11 IU/L (ref 0–40)
Albumin: 4 g/dL (ref 3.9–4.9)
Alkaline Phosphatase: 114 IU/L (ref 44–121)
BUN/Creatinine Ratio: 16 (ref 12–28)
BUN: 33 mg/dL — ABNORMAL HIGH (ref 8–27)
Bilirubin Total: 0.4 mg/dL (ref 0.0–1.2)
CO2: 24 mmol/L (ref 20–29)
Calcium: 9.4 mg/dL (ref 8.7–10.3)
Chloride: 99 mmol/L (ref 96–106)
Creatinine, Ser: 2.07 mg/dL — ABNORMAL HIGH (ref 0.57–1.00)
Globulin, Total: 2.4 g/dL (ref 1.5–4.5)
Glucose: 162 mg/dL — ABNORMAL HIGH (ref 70–99)
Potassium: 5.3 mmol/L — ABNORMAL HIGH (ref 3.5–5.2)
Sodium: 137 mmol/L (ref 134–144)
Total Protein: 6.4 g/dL (ref 6.0–8.5)
eGFR: 26 mL/min/{1.73_m2} — ABNORMAL LOW (ref >59)

## 2023-06-24 LAB — VITAMIN B12: Vitamin B-12: 898 pg/mL (ref 232–1245)

## 2023-06-28 ENCOUNTER — Telehealth: Payer: Self-pay | Admitting: *Deleted

## 2023-06-30 ENCOUNTER — Ambulatory Visit

## 2023-06-30 VITALS — BP 139/72 | HR 71 | Ht 61.0 in | Wt 219.0 lb

## 2023-06-30 DIAGNOSIS — Z0000 Encounter for general adult medical examination without abnormal findings: Secondary | ICD-10-CM | POA: Diagnosis not present
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
## 2023-06-30 NOTE — Patient Instructions (Signed)
 Tracy Farrell , Thank you for taking time out of your busy schedule to complete your Annual Wellness Visit with me. I enjoyed our conversation and look forward to speaking with you again next year. I, as well as your care team,  appreciate your ongoing commitment to your health goals. Please review the following plan we discussed and let me know if I can assist you in the future. Your Game plan/ To Do List   Follow up Visits: Next Medicare AWV with our clinical staff: 07/02/24 at 11:20 a.m  Have you seen your provider in the last 6 months (3 months if uncontrolled diabetes)? Yes Next Office Visit with your provider: 10/03/23 at 12:05p.m.  Clinician Recommendations:  Aim for 30 minutes of exercise or brisk walking, 6-8 glasses of water, and 5 servings of fruits and vegetables each day. N/a      This is a list of the screening recommended for you and due dates:  Health Maintenance  Topic Date Due   Mammogram  03/18/2021   Eye exam for diabetics  06/04/2021   COVID-19 Vaccine (4 - 2024-25 season) 07/09/2023*   Flu Shot  08/25/2023   Complete foot exam   09/14/2023   Hemoglobin A1C  12/24/2023   Cologuard (Stool DNA test)  03/16/2024   Yearly kidney health urinalysis for diabetes  03/21/2024   Yearly kidney function blood test for diabetes  06/22/2024   Medicare Annual Wellness Visit  06/29/2024   DEXA scan (bone density measurement)  03/28/2025   DTaP/Tdap/Td vaccine (2 - Td or Tdap) 05/13/2030   Pneumonia Vaccine  Completed   Hepatitis C Screening  Completed   Zoster (Shingles) Vaccine  Completed   HPV Vaccine  Aged Out   Meningitis B Vaccine  Aged Out  *Topic was postponed. The date shown is not the original due date.    Advanced directives: (Declined) Advance directive discussed with you today. Even though you declined this today, please call our office should you change your mind, and we can give you the proper paperwork for you to fill out. Advance Care Planning is important because  it:  [x]  Makes sure you receive the medical care that is consistent with your values, goals, and preferences  [x]  It provides guidance to your family and loved ones and reduces their decisional burden about whether or not they are making the right decisions based on your wishes.  Follow the link provided in your after visit summary or read over the paperwork we have mailed to you to help you started getting your Advance Directives in place. If you need assistance in completing these, please reach out to us  so that we can help you!  See attachments for Preventive Care and Fall Prevention Tips.

## 2023-07-06 DIAGNOSIS — B351 Tinea unguium: Secondary | ICD-10-CM | POA: Diagnosis not present

## 2023-07-06 DIAGNOSIS — M79676 Pain in unspecified toe(s): Secondary | ICD-10-CM | POA: Diagnosis not present

## 2023-07-12 ENCOUNTER — Other Ambulatory Visit: Payer: Self-pay | Admitting: Family Medicine

## 2023-07-12 DIAGNOSIS — I152 Hypertension secondary to endocrine disorders: Secondary | ICD-10-CM

## 2023-07-12 DIAGNOSIS — Z87898 Personal history of other specified conditions: Secondary | ICD-10-CM

## 2023-07-12 DIAGNOSIS — E1169 Type 2 diabetes mellitus with other specified complication: Secondary | ICD-10-CM

## 2023-07-12 DIAGNOSIS — F339 Major depressive disorder, recurrent, unspecified: Secondary | ICD-10-CM

## 2023-07-12 DIAGNOSIS — G2581 Restless legs syndrome: Secondary | ICD-10-CM

## 2023-07-12 DIAGNOSIS — H6993 Unspecified Eustachian tube disorder, bilateral: Secondary | ICD-10-CM

## 2023-07-12 DIAGNOSIS — R251 Tremor, unspecified: Secondary | ICD-10-CM

## 2023-07-12 DIAGNOSIS — Z794 Long term (current) use of insulin: Secondary | ICD-10-CM

## 2023-07-12 DIAGNOSIS — Z8673 Personal history of transient ischemic attack (TIA), and cerebral infarction without residual deficits: Secondary | ICD-10-CM

## 2023-07-12 DIAGNOSIS — N184 Chronic kidney disease, stage 4 (severe): Secondary | ICD-10-CM

## 2023-07-12 DIAGNOSIS — K219 Gastro-esophageal reflux disease without esophagitis: Secondary | ICD-10-CM

## 2023-07-12 NOTE — Telephone Encounter (Unsigned)
 Copied from CRM [id number]. Topic: Clinical - Medication Refill >> Jul 12, 2023  4:39 PM Fredrica W wrote: Medication:  propranolol  (INDERAL ) 10 MG tablet levETIRAcetam  (KEPPRA ) 500 MG tablet citalopram  (CELEXA ) 40 MG tablet furosemide  (LASIX ) 40 MG tablet lisinopril  (ZESTRIL ) 5 MG tablet FARXIGA  10 MG TABS tablet insulin  degludec (TRESIBA  FLEXTOUCH) 200 UNIT/ML FlexTouch Pen atorvastatin  (LIPITOR) 40 MG tablet fluticasone  (FLONASE ) 50 MCG/ACT nasal spray ASPIRIN  EC 81 MG tablet pantoprazole  SOD DR (PROTONIX ) 20 MG tablet pramipexole  (MIRAPEX ) 0.5 MG tablet  Has the patient contacted their pharmacy? Yes - patient recently enrolled with [facility] - [facility] called to request  (Agent: If no, request that the patient contact the pharmacy for the refill. If patient does not wish to contact the pharmacy document the reason why and proceed with request.) (Agent: If yes, when and what did the pharmacy advise?)  This is the patient's preferred pharmacy:  [facility] Phone: [phone] Fax: [phone]  Is this the correct pharmacy for this prescription? Yes If no, delete pharmacy and type the correct one.   Has the prescription been filled recently? Yes  Is the patient out of the medication? N/A  Has the patient been seen for an appointment in the last year OR does the patient have an upcoming appointment? Yes  Can we respond through MyChart? No  Agent: Please be advised that Rx refills may take up to 3 business days. We ask that you follow-up with your pharmacy.

## 2023-07-13 MED ORDER — TRESIBA FLEXTOUCH 200 UNIT/ML ~~LOC~~ SOPN: 50.0000 [IU] | PEN_INJECTOR | Freq: Every day | SUBCUTANEOUS | 0 refills | Status: DC

## 2023-07-13 MED ORDER — FLUTICASONE PROPIONATE 50 MCG/ACT NA SUSP: 2.0000 | Freq: Every day | NASAL | 1 refills | Status: DC

## 2023-07-13 MED ORDER — ATORVASTATIN CALCIUM 40 MG PO TABS: 40.0000 mg | ORAL_TABLET | Freq: Every day | ORAL | 0 refills | Status: DC

## 2023-07-13 MED ORDER — PRAMIPEXOLE DIHYDROCHLORIDE 0.5 MG PO TABS: 0.5000 mg | ORAL_TABLET | Freq: Every day | ORAL | 0 refills | Status: DC

## 2023-07-13 MED ORDER — PROPRANOLOL HCL 10 MG PO TABS: 10.0000 mg | ORAL_TABLET | Freq: Two times a day (BID) | ORAL | 0 refills | Status: DC

## 2023-07-13 MED ORDER — LEVETIRACETAM 500 MG PO TABS: 500.0000 mg | ORAL_TABLET | Freq: Two times a day (BID) | ORAL | 0 refills | Status: DC

## 2023-07-13 MED ORDER — ASPIRIN 81 MG PO TBEC
81.0000 mg | DELAYED_RELEASE_TABLET | Freq: Every day | ORAL | 1 refills | Status: DC
Start: 2023-07-13 — End: 2023-07-25

## 2023-07-13 MED ORDER — DAPAGLIFLOZIN PROPANEDIOL 10 MG PO TABS: 10.0000 mg | ORAL_TABLET | Freq: Every day | ORAL | 0 refills | Status: DC

## 2023-07-13 MED ORDER — FUROSEMIDE 40 MG PO TABS: 40.0000 mg | ORAL_TABLET | Freq: Every day | ORAL | 0 refills | Status: DC

## 2023-07-13 MED ORDER — CITALOPRAM HYDROBROMIDE 40 MG PO TABS: 40.0000 mg | ORAL_TABLET | Freq: Every day | ORAL | 0 refills | Status: DC

## 2023-07-13 MED ORDER — PANTOPRAZOLE SODIUM 20 MG PO TBEC
20.0000 mg | DELAYED_RELEASE_TABLET | Freq: Every day | ORAL | 0 refills | Status: DC
Start: 2023-07-13 — End: 2023-10-12

## 2023-07-13 MED ORDER — LISINOPRIL 5 MG PO TABS
5.0000 mg | ORAL_TABLET | Freq: Every day | ORAL | 2 refills | Status: AC
Start: 2023-07-13 — End: ?

## 2023-07-25 ENCOUNTER — Telehealth: Payer: Self-pay

## 2023-07-25 ENCOUNTER — Other Ambulatory Visit: Payer: Self-pay | Admitting: Family Medicine

## 2023-07-25 DIAGNOSIS — E1159 Type 2 diabetes mellitus with other circulatory complications: Secondary | ICD-10-CM

## 2023-07-25 DIAGNOSIS — Z794 Long term (current) use of insulin: Secondary | ICD-10-CM

## 2023-07-25 MED ORDER — ASPIRIN 81 MG PO TBEC: 81.0000 mg | DELAYED_RELEASE_TABLET | Freq: Every day | ORAL | 1 refills | Status: DC

## 2023-07-25 NOTE — Telephone Encounter (Signed)
 Copied from CRM [id number]. Topic: Clinical - Prescription Issue >> Jul 25, 2023 12:07 PM DeAngela L wrote: Reason for CRM: aspirin  EC (ASPIRIN  LOW DOSE) 81 MG tablet  pharm calling confirmation of dosage clarification   [facility] [place] [address] [place] [address]  Selinda NOVAK with [facility] provider line please call [phone]

## 2023-07-25 NOTE — Telephone Encounter (Signed)
 Rx re sent correctly. Pharmacy aware

## 2023-07-31 ENCOUNTER — Other Ambulatory Visit

## 2023-07-31 DIAGNOSIS — D631 Anemia in chronic kidney disease: Secondary | ICD-10-CM | POA: Diagnosis not present

## 2023-07-31 DIAGNOSIS — R809 Proteinuria, unspecified: Secondary | ICD-10-CM | POA: Diagnosis not present

## 2023-07-31 DIAGNOSIS — E211 Secondary hyperparathyroidism, not elsewhere classified: Secondary | ICD-10-CM | POA: Diagnosis not present

## 2023-07-31 DIAGNOSIS — E559 Vitamin D deficiency, unspecified: Secondary | ICD-10-CM | POA: Diagnosis not present

## 2023-07-31 DIAGNOSIS — N189 Chronic kidney disease, unspecified: Secondary | ICD-10-CM | POA: Diagnosis not present

## 2023-08-04 ENCOUNTER — Telehealth: Payer: Self-pay | Admitting: Family Medicine

## 2023-08-04 DIAGNOSIS — I5032 Chronic diastolic (congestive) heart failure: Secondary | ICD-10-CM | POA: Diagnosis not present

## 2023-08-04 DIAGNOSIS — N184 Chronic kidney disease, stage 4 (severe): Secondary | ICD-10-CM | POA: Diagnosis not present

## 2023-08-04 DIAGNOSIS — E1122 Type 2 diabetes mellitus with diabetic chronic kidney disease: Secondary | ICD-10-CM | POA: Diagnosis not present

## 2023-08-04 DIAGNOSIS — I129 Hypertensive chronic kidney disease with stage 1 through stage 4 chronic kidney disease, or unspecified chronic kidney disease: Secondary | ICD-10-CM | POA: Diagnosis not present

## 2023-08-04 NOTE — Telephone Encounter (Signed)
 Needs Diabetic Eye Exam

## 2023-08-05 ENCOUNTER — Other Ambulatory Visit: Payer: Self-pay | Admitting: Family Medicine

## 2023-08-05 DIAGNOSIS — H6993 Unspecified Eustachian tube disorder, bilateral: Secondary | ICD-10-CM

## 2023-09-17 ENCOUNTER — Other Ambulatory Visit: Payer: Self-pay | Admitting: Family Medicine

## 2023-09-17 DIAGNOSIS — G2581 Restless legs syndrome: Secondary | ICD-10-CM

## 2023-09-23 ENCOUNTER — Other Ambulatory Visit: Payer: Self-pay | Admitting: Family Medicine

## 2023-09-23 DIAGNOSIS — Z794 Long term (current) use of insulin: Secondary | ICD-10-CM

## 2023-10-03 ENCOUNTER — Ambulatory Visit: Admitting: Family Medicine

## 2023-10-12 ENCOUNTER — Other Ambulatory Visit: Payer: Self-pay | Admitting: Family Medicine

## 2023-10-12 DIAGNOSIS — F339 Major depressive disorder, recurrent, unspecified: Secondary | ICD-10-CM

## 2023-10-12 DIAGNOSIS — K219 Gastro-esophageal reflux disease without esophagitis: Secondary | ICD-10-CM

## 2023-10-12 DIAGNOSIS — E1159 Type 2 diabetes mellitus with other circulatory complications: Secondary | ICD-10-CM

## 2023-10-16 ENCOUNTER — Other Ambulatory Visit: Payer: Self-pay | Admitting: Family Medicine

## 2023-10-16 DIAGNOSIS — Z794 Long term (current) use of insulin: Secondary | ICD-10-CM

## 2023-10-17 ENCOUNTER — Other Ambulatory Visit: Payer: Self-pay | Admitting: Family Medicine

## 2023-10-17 DIAGNOSIS — Z794 Long term (current) use of insulin: Secondary | ICD-10-CM

## 2023-10-18 ENCOUNTER — Other Ambulatory Visit: Payer: Self-pay | Admitting: Family Medicine

## 2023-10-18 DIAGNOSIS — Z794 Long term (current) use of insulin: Secondary | ICD-10-CM

## 2023-10-18 NOTE — Telephone Encounter (Signed)
 LMOVM received a couple resent RF requests on Tresiba  from [facility], med was RFd at beginning of month to local [facility]. Wanting to know if she wants RFs to go to [facility] or not, did note that several of her resent RFs were [facility] to [facility] which is why I am wanting to confirm this. Gave my direct phone # for her to call back & that she can leave a mssg.

## 2023-10-19 ENCOUNTER — Other Ambulatory Visit: Payer: Self-pay | Admitting: Family Medicine

## 2023-10-19 DIAGNOSIS — E1169 Type 2 diabetes mellitus with other specified complication: Secondary | ICD-10-CM

## 2023-10-19 NOTE — Telephone Encounter (Signed)
 VM from pt yesterday while I was off, please disregard requests from [facility] she wants [facility]

## 2023-10-20 ENCOUNTER — Other Ambulatory Visit: Payer: Self-pay | Admitting: Family Medicine

## 2023-10-20 DIAGNOSIS — E1169 Type 2 diabetes mellitus with other specified complication: Secondary | ICD-10-CM

## 2023-10-21 ENCOUNTER — Other Ambulatory Visit: Payer: Self-pay | Admitting: Family Medicine

## 2023-10-21 DIAGNOSIS — E1169 Type 2 diabetes mellitus with other specified complication: Secondary | ICD-10-CM

## 2023-10-23 NOTE — Telephone Encounter (Signed)
 TC to [facility] since multiple requests have been received to let them know Per pt she is keeping her prescriptions with local [facility]

## 2023-11-06 NOTE — Progress Notes (Signed)
 Tracy Farrell                                          MRN: [id number]   11/06/2023   The [facility] Quality Team Specialist reviewed this patient medical record for the purposes of chart review for care gap closure. The following were reviewed: abstraction for care gap closure-glycemic status assessment.    [facility]I Quality Team

## 2023-11-16 ENCOUNTER — Other Ambulatory Visit: Payer: Self-pay | Admitting: Family Medicine

## 2023-11-16 ENCOUNTER — Encounter: Payer: Self-pay | Admitting: Family Medicine

## 2023-11-16 DIAGNOSIS — I152 Hypertension secondary to endocrine disorders: Secondary | ICD-10-CM

## 2023-11-16 DIAGNOSIS — F339 Major depressive disorder, recurrent, unspecified: Secondary | ICD-10-CM

## 2023-11-16 DIAGNOSIS — K219 Gastro-esophageal reflux disease without esophagitis: Secondary | ICD-10-CM

## 2023-11-16 NOTE — Telephone Encounter (Signed)
 LEFT MESSAGE TO SCHEDULE APPT AND I WILL MAIL LETTER

## 2023-11-16 NOTE — Telephone Encounter (Signed)
 Tracy Farrell pt NTBS 30-d given 10/12/23

## 2023-11-21 ENCOUNTER — Ambulatory Visit (INDEPENDENT_AMBULATORY_CARE_PROVIDER_SITE_OTHER): Admitting: Family Medicine

## 2023-11-21 ENCOUNTER — Encounter: Payer: Self-pay | Admitting: Family Medicine

## 2023-11-21 VITALS — BP 125/73 | HR 65 | Temp 96.4°F | Ht 61.0 in | Wt 224.6 lb

## 2023-11-21 DIAGNOSIS — Z8673 Personal history of transient ischemic attack (TIA), and cerebral infarction without residual deficits: Secondary | ICD-10-CM | POA: Diagnosis not present

## 2023-11-21 DIAGNOSIS — Z87898 Personal history of other specified conditions: Secondary | ICD-10-CM

## 2023-11-21 DIAGNOSIS — F339 Major depressive disorder, recurrent, unspecified: Secondary | ICD-10-CM

## 2023-11-21 DIAGNOSIS — E1159 Type 2 diabetes mellitus with other circulatory complications: Secondary | ICD-10-CM

## 2023-11-21 DIAGNOSIS — G2581 Restless legs syndrome: Secondary | ICD-10-CM | POA: Diagnosis not present

## 2023-11-21 DIAGNOSIS — N184 Chronic kidney disease, stage 4 (severe): Secondary | ICD-10-CM

## 2023-11-21 DIAGNOSIS — K219 Gastro-esophageal reflux disease without esophagitis: Secondary | ICD-10-CM | POA: Diagnosis not present

## 2023-11-21 DIAGNOSIS — E785 Hyperlipidemia, unspecified: Secondary | ICD-10-CM | POA: Diagnosis not present

## 2023-11-21 DIAGNOSIS — Z794 Long term (current) use of insulin: Secondary | ICD-10-CM | POA: Diagnosis not present

## 2023-11-21 DIAGNOSIS — E1169 Type 2 diabetes mellitus with other specified complication: Secondary | ICD-10-CM

## 2023-11-21 DIAGNOSIS — Z23 Encounter for immunization: Secondary | ICD-10-CM | POA: Diagnosis not present

## 2023-11-21 DIAGNOSIS — I152 Hypertension secondary to endocrine disorders: Secondary | ICD-10-CM | POA: Diagnosis not present

## 2023-11-21 DIAGNOSIS — R251 Tremor, unspecified: Secondary | ICD-10-CM | POA: Insufficient documentation

## 2023-11-21 DIAGNOSIS — F331 Major depressive disorder, recurrent, moderate: Secondary | ICD-10-CM | POA: Diagnosis not present

## 2023-11-21 DIAGNOSIS — Z1231 Encounter for screening mammogram for malignant neoplasm of breast: Secondary | ICD-10-CM

## 2023-11-21 LAB — CMP14+EGFR
ALT: 13 IU/L (ref 0–32)
AST: 11 IU/L (ref 0–40)
Albumin: 4.1 g/dL (ref 3.9–4.9)
Alkaline Phosphatase: 111 IU/L (ref 49–135)
BUN/Creatinine Ratio: 18 (ref 12–28)
BUN: 38 mg/dL — ABNORMAL HIGH (ref 8–27)
Bilirubin Total: 0.5 mg/dL (ref 0.0–1.2)
CO2: 23 mmol/L (ref 20–29)
Calcium: 9.4 mg/dL (ref 8.7–10.3)
Chloride: 100 mmol/L (ref 96–106)
Creatinine, Ser: 2.1 mg/dL — ABNORMAL HIGH (ref 0.57–1.00)
Globulin, Total: 2.6 g/dL (ref 1.5–4.5)
Glucose: 123 mg/dL — ABNORMAL HIGH (ref 70–99)
Potassium: 4.4 mmol/L (ref 3.5–5.2)
Sodium: 139 mmol/L (ref 134–144)
Total Protein: 6.7 g/dL (ref 6.0–8.5)
eGFR: 25 mL/min/1.73 — ABNORMAL LOW (ref >59)

## 2023-11-21 LAB — BAYER DCA HB A1C WAIVED: HB A1C (BAYER DCA - WAIVED): 7.4 % — ABNORMAL HIGH (ref 4.8–5.6)

## 2023-11-21 MED ORDER — FUROSEMIDE 40 MG PO TABS
40.0000 mg | ORAL_TABLET | Freq: Every day | ORAL | 1 refills | Status: AC
Start: 2023-11-21 — End: ?

## 2023-11-21 MED ORDER — DAPAGLIFLOZIN PROPANEDIOL 10 MG PO TABS: 10.0000 mg | ORAL_TABLET | Freq: Every day | ORAL | 1 refills | Status: AC

## 2023-11-21 MED ORDER — PROPRANOLOL HCL 10 MG PO TABS: 10.0000 mg | ORAL_TABLET | Freq: Two times a day (BID) | ORAL | 1 refills | Status: DC

## 2023-11-21 MED ORDER — PANTOPRAZOLE SODIUM 20 MG PO TBEC
20.0000 mg | DELAYED_RELEASE_TABLET | Freq: Every day | ORAL | 1 refills | Status: AC
Start: 2023-11-21 — End: ?

## 2023-11-21 MED ORDER — PRAMIPEXOLE DIHYDROCHLORIDE 0.5 MG PO TABS: 0.5000 mg | ORAL_TABLET | Freq: Every day | ORAL | 1 refills | Status: AC

## 2023-11-21 MED ORDER — LEVETIRACETAM 500 MG PO TABS: 500.0000 mg | ORAL_TABLET | Freq: Two times a day (BID) | ORAL | 1 refills | Status: DC

## 2023-11-21 MED ORDER — ATORVASTATIN CALCIUM 40 MG PO TABS: 40.0000 mg | ORAL_TABLET | Freq: Every day | ORAL | 1 refills | Status: DC

## 2023-11-21 MED ORDER — CITALOPRAM HYDROBROMIDE 40 MG PO TABS: 40.0000 mg | ORAL_TABLET | Freq: Every day | ORAL | 1 refills | Status: AC
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
## 2023-11-21 NOTE — Patient Instructions (Addendum)

## 2023-11-22 ENCOUNTER — Ambulatory Visit: Payer: Self-pay | Admitting: Family Medicine

## 2023-12-13 ENCOUNTER — Telehealth: Payer: Self-pay | Admitting: Family Medicine

## 2023-12-13 NOTE — Telephone Encounter (Signed)
 Lmtcb

## 2023-12-13 NOTE — Telephone Encounter (Signed)
 Copied from CRM [id number]. Topic: Clinical - Prescription Issue >> Dec 13, 2023 12:07 PM Tracy Farrell wrote: Reason for CRM: [name] with the [employer] called in to let the provider know that starting next year her Tresiba  Flextouch will no longer be on the formulary or covered. She is requesting the provider to call in either Lantus  Solostar or Toujeo  as they will be the ones that will be covered. If the provider agrees to call the next script in for one of these medications they are asking the provider to send a note to the pharmacy stating it will be replaced and to cancel the Tresiba . Please assist patient further and if there are any questions please call [phone]

## 2023-12-20 ENCOUNTER — Ambulatory Visit
Admission: RE | Admit: 2023-12-20 | Discharge: 2023-12-20 | Disposition: A | Discharge status: Home / Self Care | Source: Ambulatory Visit | Attending: Family Medicine | Admitting: Family Medicine

## 2023-12-20 DIAGNOSIS — Z1231 Encounter for screening mammogram for malignant neoplasm of breast: Secondary | ICD-10-CM

## 2023-12-25 NOTE — Telephone Encounter (Signed)
 lmtcb

## 2023-12-25 NOTE — Telephone Encounter (Signed)
 Patient called back, states that she does have enough medication to last her until her next office visit.

## 2023-12-31 ENCOUNTER — Other Ambulatory Visit: Payer: Self-pay | Admitting: Family Medicine

## 2023-12-31 DIAGNOSIS — E1159 Type 2 diabetes mellitus with other circulatory complications: Secondary | ICD-10-CM

## 2023-12-31 DIAGNOSIS — E119 Type 2 diabetes mellitus without complications: Secondary | ICD-10-CM

## 2023-12-31 DIAGNOSIS — I152 Hypertension secondary to endocrine disorders: Secondary | ICD-10-CM

## 2024-02-04 ENCOUNTER — Other Ambulatory Visit: Payer: Self-pay | Admitting: Family Medicine

## 2024-02-04 DIAGNOSIS — E1169 Type 2 diabetes mellitus with other specified complication: Secondary | ICD-10-CM

## 2024-02-04 DIAGNOSIS — Z87898 Personal history of other specified conditions: Secondary | ICD-10-CM

## 2024-02-04 DIAGNOSIS — R251 Tremor, unspecified: Secondary | ICD-10-CM

## 2024-02-04 DIAGNOSIS — Z8673 Personal history of transient ischemic attack (TIA), and cerebral infarction without residual deficits: Secondary | ICD-10-CM

## 2024-02-18 ENCOUNTER — Other Ambulatory Visit: Payer: Self-pay | Admitting: Family Medicine

## 2024-02-18 DIAGNOSIS — H6993 Unspecified Eustachian tube disorder, bilateral: Secondary | ICD-10-CM

## 2024-02-21 ENCOUNTER — Ambulatory Visit: Payer: Self-pay | Admitting: Family Medicine
